# Patient Record
Sex: Male | Born: 1970
Health system: Southern US, Community
[De-identification: ages and names within clinical notes are randomized; demographics above are authoritative.]

## PROBLEM LIST (undated history)

## (undated) DIAGNOSIS — I10 Essential (primary) hypertension: Secondary | ICD-10-CM

## (undated) DIAGNOSIS — E079 Disorder of thyroid, unspecified: Secondary | ICD-10-CM

## (undated) DIAGNOSIS — E039 Hypothyroidism, unspecified: Secondary | ICD-10-CM

## (undated) DIAGNOSIS — M1990 Unspecified osteoarthritis, unspecified site: Secondary | ICD-10-CM

## (undated) DIAGNOSIS — E119 Type 2 diabetes mellitus without complications: Secondary | ICD-10-CM

## (undated) DIAGNOSIS — E785 Hyperlipidemia, unspecified: Secondary | ICD-10-CM

## (undated) DIAGNOSIS — N189 Chronic kidney disease, unspecified: Secondary | ICD-10-CM

## (undated) HISTORY — DX: Essential (primary) hypertension: I10

## (undated) HISTORY — DX: Disorder of thyroid, unspecified: E07.9

## (undated) HISTORY — DX: Chronic kidney disease, unspecified: N18.9

## (undated) HISTORY — PX: APPENDECTOMY: SHX54

## (undated) HISTORY — PX: COLONOSCOPY: SHX174

## (undated) HISTORY — DX: Unspecified osteoarthritis, unspecified site: M19.90

## (undated) HISTORY — DX: Type 2 diabetes mellitus without complications: E11.9

## (undated) HISTORY — DX: Hyperlipidemia, unspecified: E78.5

---

## 2005-10-21 ENCOUNTER — Ambulatory Visit (HOSPITAL_COMMUNITY): Admission: RE | Admit: 2005-10-21 | Discharge: 2005-10-21 | Payer: Self-pay | Admitting: General Practice

## 2005-11-26 ENCOUNTER — Ambulatory Visit: Payer: Self-pay

## 2012-09-01 ENCOUNTER — Telehealth: Payer: Self-pay | Admitting: Physician Assistant

## 2012-09-01 NOTE — Telephone Encounter (Signed)
This message is too vague for me to understand and act on

## 2012-09-01 NOTE — Telephone Encounter (Signed)
Needs verification on rx.

## 2012-09-02 NOTE — Telephone Encounter (Signed)
Call him to clarify what he called for

## 2012-09-22 NOTE — Telephone Encounter (Signed)
Spoke with pt this am and he said "its been taken care of" --he needed 90 day supply and has them

## 2012-12-13 ENCOUNTER — Ambulatory Visit (INDEPENDENT_AMBULATORY_CARE_PROVIDER_SITE_OTHER): Payer: BC Managed Care – PPO | Admitting: General Practice

## 2012-12-13 ENCOUNTER — Encounter: Payer: Self-pay | Admitting: General Practice

## 2012-12-13 VITALS — BP 129/79 | HR 82 | Temp 97.1°F | Ht 72.0 in | Wt 274.0 lb

## 2012-12-13 DIAGNOSIS — E039 Hypothyroidism, unspecified: Secondary | ICD-10-CM

## 2012-12-13 DIAGNOSIS — E119 Type 2 diabetes mellitus without complications: Secondary | ICD-10-CM

## 2012-12-13 DIAGNOSIS — I10 Essential (primary) hypertension: Secondary | ICD-10-CM

## 2012-12-13 DIAGNOSIS — Z09 Encounter for follow-up examination after completed treatment for conditions other than malignant neoplasm: Secondary | ICD-10-CM

## 2012-12-13 LAB — COMPLETE METABOLIC PANEL WITH GFR
BUN: 15 mg/dL (ref 6–23)
CO2: 27 mEq/L (ref 19–32)
Creat: 1.3 mg/dL (ref 0.50–1.35)
GFR, Est African American: 77 mL/min
GFR, Est Non African American: 67 mL/min
Glucose, Bld: 129 mg/dL — ABNORMAL HIGH (ref 70–99)
Sodium: 138 mEq/L (ref 135–145)
Total Bilirubin: 0.6 mg/dL (ref 0.3–1.2)
Total Protein: 7 g/dL (ref 6.0–8.3)

## 2012-12-13 LAB — POCT CBC
HCT, POC: 44.2 % (ref 43.5–53.7)
Lymph, poc: 3 (ref 0.6–3.4)
MCH, POC: 29.1 pg (ref 27–31.2)
MCHC: 35 g/dL (ref 31.8–35.4)
MCV: 83.2 fL (ref 80–97)
POC LYMPH PERCENT: 28.5 %L (ref 10–50)
RDW, POC: 13.4 %
WBC: 10.7 10*3/uL — AB (ref 4.6–10.2)

## 2012-12-13 LAB — THYROID PANEL WITH TSH: TSH: 4.672 u[IU]/mL — ABNORMAL HIGH (ref 0.350–4.500)

## 2012-12-13 MED ORDER — METFORMIN HCL 500 MG PO TABS: 500.0000 mg | ORAL_TABLET | Freq: Every day | ORAL | Status: DC

## 2012-12-13 MED ORDER — LEVOTHYROXINE SODIUM 100 MCG PO TABS: 100.0000 ug | ORAL_TABLET | Freq: Every day | ORAL | Status: DC

## 2012-12-13 MED ORDER — LISINOPRIL-HYDROCHLOROTHIAZIDE 10-12.5 MG PO TABS: 10.0000 | ORAL_TABLET | Freq: Every day | ORAL | Status: DC

## 2012-12-13 NOTE — Progress Notes (Signed)
  Subjective:    Patient ID: Tyler Duran, male    DOB: August 23, 1970, 42 y.o.   MRN: [id number]  HPI Present today for 3 month follow up of chronic conditions. He has hypertension, hypothyroidism, and diabetes type II. He denies checking blood pressure or blood sugars at home. He reports taking medications as prescribed. He reports that his current insurance won't pay for last prescribed diabetic medication. He denies any complaints at this time.     Review of Systems  Constitutional: Negative for fever and chills.  HENT: Negative for neck pain and neck stiffness.   Respiratory: Negative for chest tightness and shortness of breath.   Cardiovascular: Negative for chest pain and palpitations.  Gastrointestinal: Negative for vomiting, abdominal pain, diarrhea and blood in stool.  Genitourinary: Negative for hematuria and difficulty urinating.  Musculoskeletal: Negative for back pain.  Neurological: Negative for dizziness, weakness and headaches.       Objective:   Physical Exam  Constitutional: He is oriented to person, place, and time. He appears well-developed and well-nourished.  HENT:  Head: Normocephalic and atraumatic.  Right Ear: External ear normal.  Left Ear: External ear normal.  Eyes: EOM are normal.  Neck: Normal range of motion. Neck supple. No thyromegaly present.  Cardiovascular: Normal rate, regular rhythm and normal heart sounds.   Pulmonary/Chest: Effort normal and breath sounds normal. No respiratory distress. He exhibits no tenderness.  Abdominal: Soft. Bowel sounds are normal.  Neurological: He is alert and oriented to person, place, and time.  Skin: Skin is warm and dry.  Psychiatric: He has a normal mood and affect.          Assessment & Plan:  1. Diabetes - POCT glycosylated hemoglobin (Hb A1C) -Discussed change to diabetic medication - metFORMIN (GLUCOPHAGE) 500 MG tablet; Take 1 tablet (500 mg total) by mouth daily with breakfast.  Dispense: 30 tablet;  Refill: 3  2. Follow-up exam, 3-6 months since previous exam - POCT CBC - COMPLETE METABOLIC PANEL WITH GFR - NMR Lipoprofile with Lipids - Vitamin D 25 hydroxy  3. Unspecified hypothyroidism - Thyroid Panel With TSH - levothyroxine (SYNTHROID, LEVOTHROID) 100 MCG tablet; Take 1 tablet (100 mcg total) by mouth daily.  Dispense: 30 tablet; Refill: 3  4. Essential hypertension, benign - lisinopril-hydrochlorothiazide (PRINZIDE,ZESTORETIC) 10-12.5 MG per tablet; Take 10-12.5 tablets by mouth daily.  Dispense: 30 tablet; Refill: 3 -Continue all current medications Labs pending F/u in 3 months Discussed exercise and diet  Patient verbalized understanding Coralie Keens, FNP-C

## 2012-12-13 NOTE — Patient Instructions (Signed)

## 2012-12-14 ENCOUNTER — Other Ambulatory Visit: Payer: Self-pay | Admitting: General Practice

## 2012-12-14 DIAGNOSIS — E039 Hypothyroidism, unspecified: Secondary | ICD-10-CM

## 2012-12-14 LAB — VITAMIN D 25 HYDROXY (VIT D DEFICIENCY, FRACTURES): Vit D, 25-Hydroxy: 29 ng/mL — ABNORMAL LOW (ref 30–89)

## 2012-12-14 LAB — NMR LIPOPROFILE WITH LIPIDS
HDL Size: 8.5 nm — ABNORMAL LOW (ref >=9.2)
HDL-C: 33 mg/dL — ABNORMAL LOW (ref >=40)
LDL Size: 20.4 nm — ABNORMAL LOW (ref >20.5)
Large HDL-P: 1.7 umol/L — ABNORMAL LOW (ref >=4.8)

## 2012-12-14 MED ORDER — LEVOTHYROXINE SODIUM 112 MCG PO TABS: 112.0000 ug | ORAL_TABLET | Freq: Every day | ORAL | Status: DC

## 2012-12-19 ENCOUNTER — Telehealth: Payer: Self-pay | Admitting: General Practice

## 2012-12-19 NOTE — Telephone Encounter (Signed)
Patient aware of results.

## 2013-03-16 ENCOUNTER — Ambulatory Visit: Payer: BC Managed Care – PPO | Admitting: General Practice

## 2013-05-22 ENCOUNTER — Other Ambulatory Visit: Payer: Self-pay

## 2013-05-22 DIAGNOSIS — E039 Hypothyroidism, unspecified: Secondary | ICD-10-CM

## 2013-05-22 DIAGNOSIS — E119 Type 2 diabetes mellitus without complications: Secondary | ICD-10-CM

## 2013-05-22 MED ORDER — LEVOTHYROXINE SODIUM 112 MCG PO TABS: 112.0000 ug | ORAL_TABLET | Freq: Every day | ORAL | Status: DC

## 2013-05-22 NOTE — Telephone Encounter (Signed)
Last seen and last glucose in July 14  Mae

## 2013-05-24 ENCOUNTER — Ambulatory Visit (INDEPENDENT_AMBULATORY_CARE_PROVIDER_SITE_OTHER): Payer: BC Managed Care – PPO | Admitting: General Practice

## 2013-05-24 VITALS — BP 124/83 | HR 78 | Temp 97.4°F | Ht 72.0 in | Wt 294.0 lb

## 2013-05-24 DIAGNOSIS — M109 Gout, unspecified: Secondary | ICD-10-CM

## 2013-05-24 DIAGNOSIS — M10072 Idiopathic gout, left ankle and foot: Secondary | ICD-10-CM

## 2013-05-24 MED ORDER — INDOMETHACIN 50 MG PO CAPS: 50.0000 mg | ORAL_CAPSULE | Freq: Three times a day (TID) | ORAL | Status: DC

## 2013-05-24 MED ORDER — COLCHICINE 0.6 MG PO TABS: ORAL_TABLET | ORAL | Status: DC

## 2013-05-24 NOTE — Patient Instructions (Signed)

## 2013-05-24 NOTE — Progress Notes (Signed)
   Subjective:    Patient ID: Tyler Duran, male    DOB: 1970-11-06, 42 y.o.   MRN: [id number]  Toe Pain  The incident occurred 2 days ago. The incident occurred at home. There was no injury mechanism. The pain is present in the left toes. The quality of the pain is described as aching. The pain is at a severity of 4/10. The pain has been fluctuating since onset. Pertinent negatives include no inability to bear weight, loss of motion, numbness or tingling. He reports no foreign bodies present. The symptoms are aggravated by movement and palpation. He has tried NSAIDs for the symptoms. The treatment provided no relief.      Review of Systems  Constitutional: Negative for fever and chills.  Respiratory: Negative for chest tightness and shortness of breath.   Cardiovascular: Negative for chest pain and palpitations.  Genitourinary: Negative for difficulty urinating.  Musculoskeletal: Positive for joint swelling.       Left great toe joint pain and swelling  Neurological: Negative for dizziness, tingling, weakness, numbness and headaches.       Objective:   Physical Exam  Constitutional: He is oriented to person, place, and time. He appears well-developed and well-nourished.  Cardiovascular: Normal rate, regular rhythm and normal heart sounds.   Pulmonary/Chest: Effort normal and breath sounds normal. No respiratory distress. He exhibits no tenderness.  Musculoskeletal: He exhibits edema and tenderness.  Erythema, non pitting edema, and tenderness noted to left great toe, metatarsophalangeal joint.   Neurological: He is alert and oriented to person, place, and time.  Skin: Skin is warm and dry.  Psychiatric: He has a normal mood and affect.          Assessment & Plan:  1. Gouty arthritis of toe, left - CMP14+EGFR - Uric acid - indomethacin (INDOCIN) 50 MG capsule; Take 1 capsule (50 mg total) by mouth 3 (three) times daily with meals.  Dispense: 30 capsule; Refill: 0 -  colchicine 0.6 MG tablet; Take 1.2 mg (2 tablets) initially, then 0.6mg  (1 tablet) one hour later, wait 12 hours then take 0.6mg  (1 tablet) twice daily.  Dispense: 60 tablet; Refill: 1 -information sheet provided on gout -RTO if symptoms worsen or unresolved -Patient verbalized understanding Coralie Keens, FNP-C

## 2013-05-25 ENCOUNTER — Other Ambulatory Visit (INDEPENDENT_AMBULATORY_CARE_PROVIDER_SITE_OTHER): Payer: BC Managed Care – PPO

## 2013-05-25 DIAGNOSIS — E785 Hyperlipidemia, unspecified: Secondary | ICD-10-CM

## 2013-05-25 DIAGNOSIS — E119 Type 2 diabetes mellitus without complications: Secondary | ICD-10-CM

## 2013-05-25 DIAGNOSIS — E559 Vitamin D deficiency, unspecified: Secondary | ICD-10-CM

## 2013-05-25 DIAGNOSIS — E039 Hypothyroidism, unspecified: Secondary | ICD-10-CM

## 2013-05-25 LAB — CMP14+EGFR
ALT: 33 IU/L (ref 0–44)
Alkaline Phosphatase: 79 IU/L (ref 39–117)
BUN/Creatinine Ratio: 15 (ref 9–20)
Chloride: 98 mmol/L (ref 97–108)
GFR calc Af Amer: 98 mL/min/{1.73_m2} (ref >59)
Potassium: 4.4 mmol/L (ref 3.5–5.2)
Sodium: 139 mmol/L (ref 134–144)
Total Bilirubin: 0.5 mg/dL (ref 0.0–1.2)

## 2013-05-25 LAB — URIC ACID: Uric Acid: 8.2 mg/dL (ref 3.7–8.6)

## 2013-05-25 LAB — POCT GLYCOSYLATED HEMOGLOBIN (HGB A1C): Hemoglobin A1C: 6.9

## 2013-05-25 NOTE — Progress Notes (Signed)
Pt came in for labs only 

## 2013-05-26 LAB — NMR, LIPOPROFILE
HDL Cholesterol by NMR: 33 mg/dL — ABNORMAL LOW (ref >=40)
HDL Particle Number: 26 umol/L — ABNORMAL LOW (ref >=30.5)
Small LDL Particle Number: 994 nmol/L — ABNORMAL HIGH (ref <=527)

## 2013-05-26 LAB — THYROID PANEL WITH TSH
Free Thyroxine Index: 2.3 (ref 1.2–4.9)
TSH: 6.28 u[IU]/mL — ABNORMAL HIGH (ref 0.450–4.500)

## 2013-05-26 MED ORDER — METFORMIN HCL 500 MG PO TABS: 500.0000 mg | ORAL_TABLET | Freq: Every day | ORAL | Status: DC

## 2013-06-08 ENCOUNTER — Other Ambulatory Visit: Payer: Self-pay | Admitting: General Practice

## 2013-06-08 DIAGNOSIS — E559 Vitamin D deficiency, unspecified: Secondary | ICD-10-CM

## 2013-06-08 DIAGNOSIS — E039 Hypothyroidism, unspecified: Secondary | ICD-10-CM

## 2013-06-08 DIAGNOSIS — R7989 Other specified abnormal findings of blood chemistry: Secondary | ICD-10-CM

## 2013-06-08 MED ORDER — LEVOTHYROXINE SODIUM 125 MCG PO TABS: 125.0000 ug | ORAL_TABLET | Freq: Every day | ORAL | Status: DC

## 2013-06-08 MED ORDER — VITAMIN D3 1.25 MG (50000 UT) PO CAPS: 1.0000 | ORAL_CAPSULE | ORAL | Status: DC

## 2013-06-20 ENCOUNTER — Telehealth: Payer: Self-pay | Admitting: *Deleted

## 2013-06-27 ENCOUNTER — Encounter: Payer: Self-pay | Admitting: *Deleted

## 2013-06-27 NOTE — Progress Notes (Signed)
Quick Note:  Copy of labs sent to patient ______ 

## 2013-06-28 NOTE — Telephone Encounter (Signed)
Pt notified of labs and verbalizes understanding Will return to clinic to recheck labs as directed

## 2013-07-10 ENCOUNTER — Emergency Department (HOSPITAL_COMMUNITY): Payer: BC Managed Care – PPO

## 2013-07-10 ENCOUNTER — Emergency Department (HOSPITAL_COMMUNITY)
Admission: EM | Admit: 2013-07-10 | Discharge: 2013-07-10 | Disposition: A | Discharge status: Home / Self Care | Payer: BC Managed Care – PPO | Attending: Emergency Medicine | Admitting: Emergency Medicine

## 2013-07-10 ENCOUNTER — Encounter (HOSPITAL_COMMUNITY): Payer: Self-pay | Admitting: Emergency Medicine

## 2013-07-10 DIAGNOSIS — E119 Type 2 diabetes mellitus without complications: Secondary | ICD-10-CM | POA: Insufficient documentation

## 2013-07-10 DIAGNOSIS — Z791 Long term (current) use of non-steroidal anti-inflammatories (NSAID): Secondary | ICD-10-CM | POA: Insufficient documentation

## 2013-07-10 DIAGNOSIS — Z79899 Other long term (current) drug therapy: Secondary | ICD-10-CM | POA: Insufficient documentation

## 2013-07-10 DIAGNOSIS — R1013 Epigastric pain: Secondary | ICD-10-CM | POA: Insufficient documentation

## 2013-07-10 DIAGNOSIS — E079 Disorder of thyroid, unspecified: Secondary | ICD-10-CM | POA: Insufficient documentation

## 2013-07-10 DIAGNOSIS — I10 Essential (primary) hypertension: Secondary | ICD-10-CM | POA: Insufficient documentation

## 2013-07-10 DIAGNOSIS — Z87891 Personal history of nicotine dependence: Secondary | ICD-10-CM | POA: Insufficient documentation

## 2013-07-10 LAB — CBC WITH DIFFERENTIAL/PLATELET
BASOS PCT: 0 % (ref 0–1)
Basophils Absolute: 0 10*3/uL (ref 0.0–0.1)
Eosinophils Absolute: 0.2 10*3/uL (ref 0.0–0.7)
Eosinophils Relative: 1 % (ref 0–5)
HEMATOCRIT: 43.6 % (ref 39.0–52.0)
Hemoglobin: 14.4 g/dL (ref 13.0–17.0)
LYMPHS PCT: 26 % (ref 12–46)
Lymphs Abs: 2.9 10*3/uL (ref 0.7–4.0)
MCH: 28.3 pg (ref 26.0–34.0)
MCHC: 33 g/dL (ref 30.0–36.0)
MCV: 85.8 fL (ref 78.0–100.0)
MONO ABS: 0.9 10*3/uL (ref 0.1–1.0)
Monocytes Relative: 8 % (ref 3–12)
Neutro Abs: 7.3 10*3/uL (ref 1.7–7.7)
Neutrophils Relative %: 64 % (ref 43–77)
Platelets: 231 10*3/uL (ref 150–400)
RBC: 5.08 MIL/uL (ref 4.22–5.81)
RDW: 13.1 % (ref 11.5–15.5)
WBC: 11.3 10*3/uL — ABNORMAL HIGH (ref 4.0–10.5)

## 2013-07-10 LAB — COMPREHENSIVE METABOLIC PANEL
ALT: 45 U/L (ref 0–53)
AST: 21 U/L (ref 0–37)
Albumin: 3.8 g/dL (ref 3.5–5.2)
Alkaline Phosphatase: 90 U/L (ref 39–117)
BUN: 13 mg/dL (ref 6–23)
CALCIUM: 9.4 mg/dL (ref 8.4–10.5)
CO2: 27 mEq/L (ref 19–32)
CREATININE: 1.13 mg/dL (ref 0.50–1.35)
Chloride: 97 mEq/L (ref 96–112)
GFR calc Af Amer: 90 mL/min — ABNORMAL LOW (ref >90)
GFR calc non Af Amer: 77 mL/min — ABNORMAL LOW (ref >90)
GLUCOSE: 214 mg/dL — AB (ref 70–99)
Potassium: 4.2 mEq/L (ref 3.7–5.3)
SODIUM: 137 meq/L (ref 137–147)
TOTAL PROTEIN: 7.5 g/dL (ref 6.0–8.3)
Total Bilirubin: 0.4 mg/dL (ref 0.3–1.2)

## 2013-07-10 LAB — LIPASE, BLOOD: Lipase: 25 U/L (ref 11–59)

## 2013-07-10 MED ORDER — SUCRALFATE 1 G PO TABS
1.0000 g | ORAL_TABLET | Freq: Three times a day (TID) | ORAL | Status: DC
  Administered 2013-07-10: 1 g via ORAL
  Filled 2013-07-10 (×6): qty 1

## 2013-07-10 MED ORDER — SUCRALFATE 1 G PO TABS
ORAL_TABLET | ORAL | Status: AC
  Filled 2013-07-10: qty 1

## 2013-07-10 MED ORDER — OMEPRAZOLE 40 MG PO CPDR: 40.0000 mg | DELAYED_RELEASE_CAPSULE | Freq: Every day | ORAL | Status: DC

## 2013-07-10 MED ORDER — GI COCKTAIL ~~LOC~~
30.0000 mL | Freq: Once | ORAL | Status: AC
  Administered 2013-07-10: 30 mL via ORAL
  Filled 2013-07-10: qty 30

## 2013-07-10 MED ORDER — IOHEXOL 300 MG/ML  SOLN
50.0000 mL | Freq: Once | INTRAMUSCULAR | Status: AC | PRN
  Administered 2013-07-10: 50 mL via ORAL

## 2013-07-10 MED ORDER — SUCRALFATE 1 G PO TABS: 1.0000 g | ORAL_TABLET | Freq: Three times a day (TID) | ORAL | Status: DC

## 2013-07-10 MED ORDER — HYDROMORPHONE HCL PF 1 MG/ML IJ SOLN
1.0000 mg | Freq: Once | INTRAMUSCULAR | Status: AC
  Administered 2013-07-10: 1 mg via INTRAVENOUS
  Filled 2013-07-10: qty 1

## 2013-07-10 MED ORDER — FENTANYL CITRATE 0.05 MG/ML IJ SOLN: 100.0000 ug | Freq: Once | INTRAMUSCULAR | Status: DC

## 2013-07-10 MED ORDER — HYDROCODONE-ACETAMINOPHEN 5-325 MG PO TABS: 2.0000 | ORAL_TABLET | ORAL | Status: DC | PRN

## 2013-07-10 MED ORDER — FENTANYL CITRATE 0.05 MG/ML IJ SOLN
100.0000 ug | Freq: Once | INTRAMUSCULAR | Status: AC
  Administered 2013-07-10: 100 ug via INTRAVENOUS
  Filled 2013-07-10: qty 2

## 2013-07-10 MED ORDER — ONDANSETRON HCL 4 MG/2ML IJ SOLN
4.0000 mg | Freq: Once | INTRAMUSCULAR | Status: AC
  Administered 2013-07-10: 4 mg via INTRAVENOUS
  Filled 2013-07-10: qty 2

## 2013-07-10 MED ORDER — IOHEXOL 300 MG/ML  SOLN
100.0000 mL | Freq: Once | INTRAMUSCULAR | Status: AC | PRN
  Administered 2013-07-10: 100 mL via INTRAVENOUS

## 2013-07-10 MED ORDER — SODIUM CHLORIDE 0.9 % IV SOLN
INTRAVENOUS | Status: DC
  Administered 2013-07-10: 1000 mL via INTRAVENOUS

## 2013-07-10 NOTE — ED Notes (Signed)
Pt reporting epigastric pain starting about 9:30 tonight.  Pt denies pain radiates to any location.  Denies nausea or vomiting.  Reports pain has been progressively getting worse, particularly when laying down.

## 2013-07-10 NOTE — ED Notes (Signed)
EKG given to MD

## 2013-07-10 NOTE — ED Provider Notes (Signed)
CSN: [id number]     Arrival date & time 07/10/13  0110 History   First MD Initiated Contact with Patient 07/10/13 0142     Chief Complaint  Patient presents with  . Abdominal Pain   (Consider location/radiation/quality/duration/timing/severity/associated sxs/prior Treatment) HPI This is a 43 year old male with epigastric pain that began yesterday evening about 9:30. He describes it as feeling like there is a knot in his stomach. It is worse with palpation or movement. He took Pepto-Bismol without relief. He has had some retching but no frank vomiting. He denies diarrhea, constipation, fever, chest pain or shortness of breath. He rates the pain right now is about a 7.5/10. He denies a history of similar pain. The pain is somewhat worse when lying supine.  Past Medical History  Diagnosis Date  . Diabetes mellitus without complication   . Hypertension   . Thyroid disease    Past Surgical History  Procedure Laterality Date  . Appendectomy     Family History  Problem Relation Age of Onset  . Cancer Mother   . Diabetes Father    History  Substance Use Topics  . Smoking status: Former Smoker    Types: Cigarettes  . Smokeless tobacco: Not on file  . Alcohol Use: Yes     Comment: rare    Review of Systems  All other systems reviewed and are negative.    Allergies  Review of patient's allergies indicates no known allergies.  Home Medications   Current Outpatient Rx  Name  Route  Sig  Dispense  Refill  . levothyroxine (SYNTHROID, LEVOTHROID) 125 MCG tablet   Oral   Take 1 tablet (125 mcg total) by mouth daily before breakfast.   30 tablet   1   . lisinopril-hydrochlorothiazide (PRINZIDE,ZESTORETIC) 10-12.5 MG per tablet   Oral   Take 10-12.5 tablets by mouth daily.   30 tablet   3   . metFORMIN (GLUCOPHAGE) 500 MG tablet   Oral   Take 1 tablet (500 mg total) by mouth daily with breakfast.   30 tablet   0   . Cholecalciferol (VITAMIN D3) 50000 UNITS CAPS   Oral  Take 1 capsule by mouth once a week.   8 capsule   0   . colchicine 0.6 MG tablet      Take 1.2 mg (2 tablets) initially, then 0.6mg  (1 tablet) one hour later, wait 12 hours then take 0.6mg  (1 tablet) twice daily.   60 tablet   1   . indomethacin (INDOCIN) 50 MG capsule   Oral   Take 1 capsule (50 mg total) by mouth 3 (three) times daily with meals.   30 capsule   0    BP 110/59  Pulse 96  Temp(Src) 98.5 F (36.9 C) (Oral)  Resp 18  Ht 6' (1.829 m)  Wt 274 lb (124.286 kg)  BMI 37.15 kg/m2  SpO2 91%  Physical Exam General: Well-developed, well-nourished male in no acute distress; appearance consistent with age of record HENT: normocephalic; atraumatic Eyes: pupils equal, round and reactive to light; extraocular muscles intact Neck: supple Heart: regular rate and rhythm Lungs: clear to auscultation bilaterally Abdomen: soft; nondistended; epigastric tenderness; no masses or hepatosplenomegaly; bowel sounds present; no gallstones seen on bedside ultrasound, no sonographic Murphy's sign Extremities: No deformity; full range of motion; pulses normal Neurologic: Awake, alert and oriented; motor function intact in all extremities and symmetric; no facial droop Skin: Warm and dry Psychiatric: Normal mood and affect    ED Course  Procedures (including critical care time)   MDM   Nursing notes and vitals signs, including pulse oximetry, reviewed.  Summary of this visit's results, reviewed by myself:  Labs:  Results for orders placed during the hospital encounter of 07/10/13 (from the past 24 hour(s))  COMPREHENSIVE METABOLIC PANEL     Status: Abnormal   Collection Time    07/10/13  2:00 AM      Result Value Range   Sodium 137  137 - 147 mEq/L   Potassium 4.2  3.7 - 5.3 mEq/L   Chloride 97  96 - 112 mEq/L   CO2 27  19 - 32 mEq/L   Glucose, Bld 214 (*) 70 - 99 mg/dL   BUN 13  6 - 23 mg/dL   Creatinine, Ser 1.13  0.50 - 1.35 mg/dL   Calcium 9.4  8.4 - 10.5 mg/dL    Total Protein 7.5  6.0 - 8.3 g/dL   Albumin 3.8  3.5 - 5.2 g/dL   AST 21  0 - 37 U/L   ALT 45  0 - 53 U/L   Alkaline Phosphatase 90  39 - 117 U/L   Total Bilirubin 0.4  0.3 - 1.2 mg/dL   GFR calc non Af Amer 77 (*) >90 mL/min   GFR calc Af Amer 90 (*) >90 mL/min  CBC WITH DIFFERENTIAL     Status: Abnormal   Collection Time    07/10/13  2:00 AM      Result Value Range   WBC 11.3 (*) 4.0 - 10.5 K/uL   RBC 5.08  4.22 - 5.81 MIL/uL   Hemoglobin 14.4  13.0 - 17.0 g/dL   HCT 43.6  39.0 - 52.0 %   MCV 85.8  78.0 - 100.0 fL   MCH 28.3  26.0 - 34.0 pg   MCHC 33.0  30.0 - 36.0 g/dL   RDW 13.1  11.5 - 15.5 %   Platelets 231  150 - 400 K/uL   Neutrophils Relative % 64  43 - 77 %   Neutro Abs 7.3  1.7 - 7.7 K/uL   Lymphocytes Relative 26  12 - 46 %   Lymphs Abs 2.9  0.7 - 4.0 K/uL   Monocytes Relative 8  3 - 12 %   Monocytes Absolute 0.9  0.1 - 1.0 K/uL   Eosinophils Relative 1  0 - 5 %   Eosinophils Absolute 0.2  0.0 - 0.7 K/uL   Basophils Relative 0  0 - 1 %   Basophils Absolute 0.0  0.0 - 0.1 K/uL  LIPASE, BLOOD     Status: None   Collection Time    07/10/13  2:00 AM      Result Value Range   Lipase 25  11 - 59 U/L    Imaging Studies: Ct Abdomen Pelvis W Contrast  07/10/2013   CLINICAL DATA:  Epigastric pain and abdominal cramping.  EXAM: CT ABDOMEN AND PELVIS WITH CONTRAST  TECHNIQUE: Multidetector CT imaging of the abdomen and pelvis was performed using the standard protocol following bolus administration of intravenous contrast.  CONTRAST:  49mL OMNIPAQUE IOHEXOL 300 MG/ML SOLN, 175mL OMNIPAQUE IOHEXOL 300 MG/ML SOLN  COMPARISON:  None.  FINDINGS: BODY WALL: Unremarkable.  LOWER CHEST: Mild atelectasis at the left base.  ABDOMEN/PELVIS:  Liver: Diffuse fatty infiltration of the liver.  Biliary: No evidence of biliary obstruction or stone.  Pancreas: Unremarkable.  Spleen: Unremarkable.  Adrenals: Unremarkable.  Kidneys and ureters: Punctate, nonobstructive stone in the interpolar left  kidney. There are multiple bilateral low dense renal lesions, the majority too small to characterize. A measurable lesion in in the upper pole left kidney is simple/cystic, measuring 11 mm.  Bladder: Unremarkable.  Reproductive: Unremarkable.  Bowel: No obstruction. Surgically absent appendix.  Retroperitoneum: No mass or adenopathy.  Peritoneum: No free fluid or gas.  Vascular: Mild aortic and branch vessel atherosclerosis.  OSSEOUS: No acute abnormalities.  IMPRESSION: 1. Negative for acute intra-abdominal abnormality. 2. Fatty liver. 3. Nonobstructive left nephrolithiasis.   Electronically Signed   By: Jorje Guild M.D.   On: 07/10/2013 05:06   6:06 AM Patient's pain down to a 3/10 after IV analgesics. Patient reports no significant relief with Carafate or GI cocktail administered by mouth. Abdomen is still tender in the epigastrium and left upper quadrant.  6:13 AM We will place the patient on Carafate 4 times a day as well as proton pump inhibitor. He was advised to contact his PCP at [facility] today to arrange followup. He was advised to return should symptoms worsen over the next 12-24 hours.      Wynetta Fines, MD 07/10/13 [phone]

## 2013-07-10 NOTE — Discharge Instructions (Signed)
Abdominal Pain, Adult Many things can cause abdominal pain. Usually, abdominal pain is not caused by a disease and will improve without treatment. It can often be observed and treated at home. Your health care provider will do a physical exam and possibly order blood tests and X-rays to help determine the seriousness of your pain. However, in many cases, more time must pass before a clear cause of the pain can be found. Before that point, your health care provider may not know if you need more testing or further treatment. HOME CARE INSTRUCTIONS  Monitor your abdominal pain for any changes. The following actions may help to alleviate any discomfort you are experiencing:  Only take over-the-counter or prescription medicines as directed by your health care provider.  Do not take laxatives unless directed to do so by your health care provider.  Try a clear liquid diet (broth, tea, or water) as directed by your health care provider. Slowly move to a bland diet as tolerated. SEEK MEDICAL CARE IF:  You have unexplained abdominal pain.  You have abdominal pain associated with nausea or diarrhea.  You have pain when you urinate or have a bowel movement.  You experience abdominal pain that wakes you in the night.  You have abdominal pain that is worsened or improved by eating food.  You have abdominal pain that is worsened with eating fatty foods. SEEK IMMEDIATE MEDICAL CARE IF:   You have a fever.  You keep throwing up (vomiting).  Your pain is felt only in portions of the abdomen, such as the right side or the left lower portion of the abdomen.  You pass bloody or black tarry stools. MAKE SURE YOU:  Understand these instructions.   Will watch your condition.   Will get help right away if you are not doing well or get worse.  Document Released: 03/04/2005 Document Revised: 03/15/2013 Document Reviewed: 02/01/2013 [facility] Patient Information 2014 [employer].

## 2013-07-11 ENCOUNTER — Encounter: Payer: Self-pay | Admitting: Family Medicine

## 2013-07-11 ENCOUNTER — Ambulatory Visit (INDEPENDENT_AMBULATORY_CARE_PROVIDER_SITE_OTHER): Payer: BC Managed Care – PPO | Admitting: Family Medicine

## 2013-07-11 ENCOUNTER — Other Ambulatory Visit: Payer: Self-pay | Admitting: Family Medicine

## 2013-07-11 VITALS — BP 121/78 | HR 114 | Temp 97.2°F | Wt 272.8 lb

## 2013-07-11 DIAGNOSIS — D72829 Elevated white blood cell count, unspecified: Secondary | ICD-10-CM

## 2013-07-11 DIAGNOSIS — R42 Dizziness and giddiness: Secondary | ICD-10-CM

## 2013-07-11 DIAGNOSIS — R1011 Right upper quadrant pain: Secondary | ICD-10-CM

## 2013-07-11 DIAGNOSIS — R1013 Epigastric pain: Secondary | ICD-10-CM

## 2013-07-11 LAB — POCT CBC
Granulocyte percent: 73.2 %G (ref 37–80)
HCT, POC: 50 % (ref 43.5–53.7)
Hemoglobin: 16.3 g/dL (ref 14.1–18.1)
Lymph, poc: 4.5 — AB (ref 0.6–3.4)
MCH, POC: 27.5 pg (ref 27–31.2)
MCHC: 32.7 g/dL (ref 31.8–35.4)
MCV: 84.2 fL (ref 80–97)
MPV: 9.6 fL (ref 0–99.8)
POC Granulocyte: 13.2 — AB (ref 2–6.9)
POC LYMPH PERCENT: 24.7 %L (ref 10–50)
Platelet Count, POC: 241 10*3/uL (ref 142–424)
RBC: 5.9 M/uL (ref 4.69–6.13)
RDW, POC: 13.7 %
WBC: 18.1 10*3/uL — AB (ref 4.6–10.2)

## 2013-07-11 NOTE — Progress Notes (Signed)
   Subjective:    Patient ID: Tyler Duran, male    DOB: [birth year], 43 y.o.   MRN: [id number]  HPI  This 43 y.o. male presents for evaluation of abdominal pain.  He was recently hospitalized and discharged yesterday for abdominal pain.  He underwent CT and [place] which were negative for Abdominal pathology.  He was put on carafate 1 gram AC and HS and his pain is still there But not severe like it was.  Review of Systems C/o abdominal pain   No chest pain, SOB, HA, dizziness, vision change, N/V, diarrhea, constipation, dysuria, urinary urgency or frequency, myalgias, arthralgias or rash.  Objective:   Physical Exam Vital signs noted  Well developed well nourished male.  HEENT - Head atraumatic Normocephalic                Eyes - PERRLA, Conjuctiva - clear Sclera- Clear EOMI                Ears - EAC's Wnl TM's Wnl Gross Hearing WNL                Nose - Nares patent                 Throat - oropharanx wnl Respiratory - Lungs CTA bilateral Cardiac - RRR S1 and S2 without murmur GI - Abdomen soft, tender RUQ and positive Murphy's, and bowel sounds active x 4 Extremities - No edema. Neuro - Grossly intact.   EKG - NSR with no acute ST-T changes    Assessment & Plan:  .Epigastric pain - Plan: EKG 12-Lead, Ambulatory referral to Gastroenterology, POCT CBC, CMP14+EGFR  Dizziness - Plan: EKG 12-Lead, Ambulatory referral to Gastroenterology, POCT CBC, CMP14+EGFR  Continue carafate, if pain worsens then follow up prn or go to ED.  Lysbeth Penner FNP

## 2013-07-11 NOTE — Patient Instructions (Signed)
Abdominal Pain, Adult °Many things can cause abdominal pain. Usually, abdominal pain is not caused by a disease and will improve without treatment. It can often be observed and treated at home. Your health care provider will do a physical exam and possibly order blood tests and X-rays to help determine the seriousness of your pain. However, in many cases, more time must pass before a clear cause of the pain can be found. Before that point, your health care provider may not know if you need more testing or further treatment. °HOME CARE INSTRUCTIONS  °Monitor your abdominal pain for any changes. The following actions may help to alleviate any discomfort you are experiencing: °· Only take over-the-counter or prescription medicines as directed by your health care provider. °· Do not take laxatives unless directed to do so by your health care provider. °· Try a clear liquid diet (broth, tea, or water) as directed by your health care provider. Slowly move to a bland diet as tolerated. °SEEK MEDICAL CARE IF: °· You have unexplained abdominal pain. °· You have abdominal pain associated with nausea or diarrhea. °· You have pain when you urinate or have a bowel movement. °· You experience abdominal pain that wakes you in the night. °· You have abdominal pain that is worsened or improved by eating food. °· You have abdominal pain that is worsened with eating fatty foods. °SEEK IMMEDIATE MEDICAL CARE IF:  °· Your pain does not go away within 2 hours. °· You have a fever. °· You keep throwing up (vomiting). °· Your pain is felt only in portions of the abdomen, such as the right side or the left lower portion of the abdomen. °· You pass bloody or black tarry stools. °MAKE SURE YOU: °· Understand these instructions.   °· Will watch your condition.   °· Will get help right away if you are not doing well or get worse.   °Document Released: 03/04/2005 Document Revised: 03/15/2013 Document Reviewed: 02/01/2013 °[employer]® Patient  Information ©2014 [employer]. ° °

## 2013-07-12 ENCOUNTER — Emergency Department (HOSPITAL_COMMUNITY)
Admission: EM | Admit: 2013-07-12 | Discharge: 2013-07-12 | Disposition: A | Discharge status: Home / Self Care | Payer: BC Managed Care – PPO | Attending: Emergency Medicine | Admitting: Emergency Medicine

## 2013-07-12 ENCOUNTER — Encounter (HOSPITAL_COMMUNITY): Payer: Self-pay | Admitting: Emergency Medicine

## 2013-07-12 ENCOUNTER — Telehealth: Payer: Self-pay | Admitting: Family Medicine

## 2013-07-12 DIAGNOSIS — E119 Type 2 diabetes mellitus without complications: Secondary | ICD-10-CM | POA: Insufficient documentation

## 2013-07-12 DIAGNOSIS — Z87891 Personal history of nicotine dependence: Secondary | ICD-10-CM | POA: Insufficient documentation

## 2013-07-12 DIAGNOSIS — Z79899 Other long term (current) drug therapy: Secondary | ICD-10-CM | POA: Insufficient documentation

## 2013-07-12 DIAGNOSIS — I10 Essential (primary) hypertension: Secondary | ICD-10-CM | POA: Insufficient documentation

## 2013-07-12 DIAGNOSIS — E079 Disorder of thyroid, unspecified: Secondary | ICD-10-CM | POA: Insufficient documentation

## 2013-07-12 DIAGNOSIS — R1011 Right upper quadrant pain: Secondary | ICD-10-CM | POA: Insufficient documentation

## 2013-07-12 DIAGNOSIS — R109 Unspecified abdominal pain: Secondary | ICD-10-CM

## 2013-07-12 LAB — HEPATIC FUNCTION PANEL
ALBUMIN: 4.1 g/dL (ref 3.5–5.2)
ALK PHOS: 92 U/L (ref 39–117)
ALT: 33 U/L (ref 0–53)
AST: 24 U/L (ref 0–37)
Bilirubin, Direct: <0.2 mg/dL (ref 0.0–0.3)
Total Bilirubin: 0.6 mg/dL (ref 0.3–1.2)
Total Protein: 8.7 g/dL — ABNORMAL HIGH (ref 6.0–8.3)

## 2013-07-12 LAB — CMP14+EGFR
ALT: 35 IU/L (ref 0–44)
AST: 15 IU/L (ref 0–40)
Albumin/Globulin Ratio: 1.7 (ref 1.1–2.5)
Albumin: 4.7 g/dL (ref 3.5–5.5)
Alkaline Phosphatase: 84 IU/L (ref 39–117)
BUN/Creatinine Ratio: 9 (ref 9–20)
BUN: 10 mg/dL (ref 6–24)
CO2: 25 mmol/L (ref 18–29)
Calcium: 10.2 mg/dL (ref 8.7–10.2)
Chloride: 91 mmol/L — ABNORMAL LOW (ref 97–108)
Creatinine, Ser: 1.16 mg/dL (ref 0.76–1.27)
GFR calc Af Amer: 88 mL/min/{1.73_m2} (ref >59)
GFR calc non Af Amer: 76 mL/min/{1.73_m2} (ref >59)
Globulin, Total: 2.7 g/dL (ref 1.5–4.5)
Glucose: 198 mg/dL — ABNORMAL HIGH (ref 65–99)
Potassium: 4.5 mmol/L (ref 3.5–5.2)
Sodium: 136 mmol/L (ref 134–144)
Total Bilirubin: 0.7 mg/dL (ref 0.0–1.2)
Total Protein: 7.4 g/dL (ref 6.0–8.5)

## 2013-07-12 LAB — URINALYSIS, ROUTINE W REFLEX MICROSCOPIC
GLUCOSE, UA: NEGATIVE mg/dL
HGB URINE DIPSTICK: NEGATIVE
LEUKOCYTES UA: NEGATIVE
Nitrite: NEGATIVE
Protein, ur: NEGATIVE mg/dL
Specific Gravity, Urine: >1.03 — ABNORMAL HIGH (ref 1.005–1.030)
Urobilinogen, UA: 1 mg/dL (ref 0.0–1.0)
pH: 5.5 (ref 5.0–8.0)

## 2013-07-12 LAB — BASIC METABOLIC PANEL
BUN: 17 mg/dL (ref 6–23)
CALCIUM: 9.9 mg/dL (ref 8.4–10.5)
CHLORIDE: 98 meq/L (ref 96–112)
CO2: 26 meq/L (ref 19–32)
Creatinine, Ser: 1.25 mg/dL (ref 0.50–1.35)
GFR calc Af Amer: 79 mL/min — ABNORMAL LOW (ref >90)
GFR calc non Af Amer: 69 mL/min — ABNORMAL LOW (ref >90)
GLUCOSE: 180 mg/dL — AB (ref 70–99)
Potassium: 4.2 mEq/L (ref 3.7–5.3)
Sodium: 138 mEq/L (ref 137–147)

## 2013-07-12 LAB — CBC
HCT: 51 % (ref 39.0–52.0)
HEMOGLOBIN: 17.3 g/dL — AB (ref 13.0–17.0)
MCH: 29 pg (ref 26.0–34.0)
MCHC: 33.9 g/dL (ref 30.0–36.0)
MCV: 85.6 fL (ref 78.0–100.0)
PLATELETS: 257 10*3/uL (ref 150–400)
RBC: 5.96 MIL/uL — AB (ref 4.22–5.81)
RDW: 13.4 % (ref 11.5–15.5)
WBC: 16.5 10*3/uL — AB (ref 4.0–10.5)

## 2013-07-12 NOTE — Discharge Instructions (Signed)
Return tomorrow for ultrasound of abdomen.  If the ultrasound is normal then follow up this week with your family md

## 2013-07-12 NOTE — ED Provider Notes (Addendum)
CSN: [id number]     Arrival date & time 07/12/13  1400 History   This chart was scribed for Maudry Diego, MD by Era Bumpers, ED scribe. This patient was seen in room APA03/APA03 and the patient's care was started at 1800.    Chief Complaint  Patient presents with  . Abdominal Pain   Patient is a 43 y.o. male presenting with abdominal pain. The history is provided by the patient. No language interpreter was used.  Abdominal Pain Pain radiates to:  Does not radiate Pain severity:  No pain Duration:  4 hours Progression:  Improving Chronicity:  New Relieved by:  Nothing Worsened by:  Coughing Ineffective treatments:  None tried Associated symptoms: no chest pain, no cough, no diarrhea, no fatigue, no hematuria and no nausea    HPI Comments: Tyler Duran is a 43 y.o. male who presents to the Emergency Department for follow up of an ongoing problem relating to his abdomen which began 4 days ago and was instructed to go to ER by his PCP for elevated WBC along with his abdominal pain. He states was originally seen by his PCP 4 days ago while having cramping abdominal pain, had a negative US abdomen and was sent home. The next day he was bed resting and had improvement of his abdominal pain but did have some dizziness and generally not feeling well. 2 days ago he had more blood work performed at PCP and yesterday was informed it was elevated and today was instructed to go to ER. He states has outpatient CT orders as well.   He denies current abdominal pain unless he voluntarily coughs or laughs. He also reports reproducible abdominal pain by pressure to his abdomen. He denies nausea.   Past Medical History  Diagnosis Date  . Diabetes mellitus without complication   . Hypertension   . Thyroid disease    Past Surgical History  Procedure Laterality Date  . Appendectomy     Family History  Problem Relation Age of Onset  . Cancer Mother   . Diabetes Father    History  Substance Use  Topics  . Smoking status: Former Smoker    Types: Cigarettes  . Smokeless tobacco: Not on file  . Alcohol Use: Yes     Comment: rare    Review of Systems  Constitutional: Negative for appetite change and fatigue.  HENT: Negative for congestion, ear discharge and sinus pressure.   Eyes: Negative for discharge.  Respiratory: Negative for cough.   Cardiovascular: Negative for chest pain.  Gastrointestinal: Positive for abdominal pain. Negative for nausea and diarrhea.  Genitourinary: Negative for frequency and hematuria.  Musculoskeletal: Negative for back pain.  Skin: Negative for rash.  Neurological: Negative for seizures and headaches.  Psychiatric/Behavioral: Negative for hallucinations.  All other systems reviewed and are negative.    Allergies  Review of patient's allergies indicates no known allergies.  Home Medications   Current Outpatient Rx  Name  Route  Sig  Dispense  Refill  . Cholecalciferol (VITAMIN D3) 50000 UNITS CAPS   Oral   Take 1 capsule by mouth once a week.   8 capsule   0   . colchicine 0.6 MG tablet      Take 1.2 mg (2 tablets) initially, then 0.6mg  (1 tablet) one hour later, wait 12 hours then take 0.6mg  (1 tablet) twice daily.   60 tablet   1   . HYDROcodone-acetaminophen (NORCO) 5-325 MG per tablet   Oral   Take  2 tablets by mouth every 4 (four) hours as needed.   6 tablet   0   . indomethacin (INDOCIN) 50 MG capsule   Oral   Take 1 capsule (50 mg total) by mouth 3 (three) times daily with meals.   30 capsule   0   . levothyroxine (SYNTHROID, LEVOTHROID) 125 MCG tablet   Oral   Take 1 tablet (125 mcg total) by mouth daily before breakfast.   30 tablet   1   . lisinopril-hydrochlorothiazide (PRINZIDE,ZESTORETIC) 10-12.5 MG per tablet   Oral   Take 10-12.5 tablets by mouth daily.   30 tablet   3   . metFORMIN (GLUCOPHAGE) 500 MG tablet   Oral   Take 1 tablet (500 mg total) by mouth daily with breakfast.   30 tablet   0   .  omeprazole (PRILOSEC) 40 MG capsule   Oral   Take 1 capsule (40 mg total) by mouth daily.   30 capsule   0   . sucralfate (CARAFATE) 1 G tablet   Oral   Take 1 tablet (1 g total) by mouth 4 (four) times daily -  with meals and at bedtime.   40 tablet   0    Triage Vitals: BP 147/90  Pulse 124  Temp(Src) 98.7 F (37.1 C)  Resp 18  SpO2 95%  Physical Exam  Nursing note and vitals reviewed. Constitutional: He is oriented to person, place, and time. He appears well-developed and well-nourished. No distress.  HENT:  Head: Normocephalic and atraumatic.  Eyes: Conjunctivae are normal. Right eye exhibits no discharge. Left eye exhibits no discharge.  Neck: Normal range of motion.  Cardiovascular: Normal rate.   Pulmonary/Chest: Effort normal. No respiratory distress.  Abdominal: Soft. He exhibits no distension. There is tenderness (Minor RUQ tenderness).  Musculoskeletal: Normal range of motion. He exhibits no edema.  Neurological: He is alert and oriented to person, place, and time.  Skin: Skin is warm and dry.  Psychiatric: He has a normal mood and affect. Thought content normal.   ED Course  Procedures (including critical care time) DIAGNOSTIC STUDIES: Oxygen Saturation is 95% on room air, adequate by my interpretation.    COORDINATION OF CARE: At 1830 PM Discussed treatment plan with patient which includes blood work, UA, consult to surgery. Patient agrees.   Labs Review Labs Reviewed  CBC - Abnormal; Notable for the following:    WBC 16.5 (*)    RBC 5.96 (*)    Hemoglobin 17.3 (*)    All other components within normal limits  BASIC METABOLIC PANEL - Abnormal; Notable for the following:    Glucose, Bld 180 (*)    GFR calc non Af Amer 69 (*)    GFR calc Af Amer 79 (*)    All other components within normal limits  URINALYSIS, ROUTINE W REFLEX MICROSCOPIC   Imaging Review No results found.  EKG Interpretation   None      MDM  Abdominal pain.  I spoke with  general surgery and Dr. Arnoldo Morale stated if the hepatic function was normal the pt could have a formal US tomorrow am as out pt.  If [place] normal the pt can follow up with pcp this week.    The chart was scribed for me under my direct supervision.  I personally performed the history, physical, and medical decision making and all procedures in the evaluation of this patient.Maudry Diego, MD 07/12/13 2037  Maudry Diego, MD  07/12/13 2229 

## 2013-07-12 NOTE — ED Notes (Signed)
Here Sunday for abdominal pain and followed up with PMD yesterday. States WBC had doubled since Sunday and instructed pt to come to the ED.

## 2013-07-13 ENCOUNTER — Ambulatory Visit (HOSPITAL_COMMUNITY)
Admit: 2013-07-13 | Discharge: 2013-07-13 | Disposition: A | Discharge status: Home / Self Care | Payer: BC Managed Care – PPO | Attending: Emergency Medicine | Admitting: Emergency Medicine

## 2013-07-13 DIAGNOSIS — R160 Hepatomegaly, not elsewhere classified: Secondary | ICD-10-CM | POA: Insufficient documentation

## 2013-07-13 DIAGNOSIS — K8020 Calculus of gallbladder without cholecystitis without obstruction: Secondary | ICD-10-CM | POA: Insufficient documentation

## 2013-07-13 DIAGNOSIS — K7689 Other specified diseases of liver: Secondary | ICD-10-CM | POA: Insufficient documentation

## 2013-07-13 DIAGNOSIS — R1011 Right upper quadrant pain: Secondary | ICD-10-CM | POA: Insufficient documentation

## 2013-07-13 NOTE — Telephone Encounter (Signed)
This is the patient that you cancelled his surgical referral and we still have referral for GI

## 2013-07-13 NOTE — ED Provider Notes (Signed)
Pt returned today for [place].  Report is post for gall stones and positive murphy sign.  Pt with minimal pain,  No vomiting and minimal tenderness.  I spoke with dr. Arnoldo Morale.  He stated to put the pt on cipro and he will see the pt Tuesday at [place], MD 07/13/13 [phone]

## 2013-07-17 ENCOUNTER — Other Ambulatory Visit: Payer: Self-pay | Admitting: Family Medicine

## 2013-07-17 DIAGNOSIS — R109 Unspecified abdominal pain: Secondary | ICD-10-CM

## 2013-07-17 NOTE — Telephone Encounter (Signed)
He needs referral to surgery ASAP

## 2013-07-18 NOTE — H&P (Signed)
NTS SOAP Note  Vital Signs:  Vitals as of: 09/14/8117: Systolic 147: Diastolic 92: Heart Rate 89: Temp 97.35F: Height 19ft 0in: Weight 272Lbs 0 Ounces: Pain Level 1: BMI 36.89  BMI : 36.89 kg/m2  Subjective: This 43 Years 36 Months old Male presents for of abdominal pain.  Seen in ER recently for right upper quadrant abdominal pain, u/s of gallbladder shows cholelithiasis, normal common bile duct.  WBC elevated, started in Ciprofloxacin.  LFT's wnl.  No fever, chills, jaundice.  No nausea, vomiting, fatty food intolerance.  Review of Symptoms:    dizzy feeling Head:unremarkable    Eyes:unremarkable   Nose/Mouth/Throat:unremarkable Cardiovascular:  unremarkable   Respiratory:unremarkable   Gastrointestin    abdominal pain Genitourinary:unremarkable     Musculoskeletal:unremarkable   Skin:unremarkable Hematolgic/Lymphatic:unremarkable     Allergic/Immunologic:unremarkable     Past Medical History:    Reviewed  Past Medical History  Surgical History: appendectomy Medical Problems: HTN, hypothyroidism, niddm Allergies: nkda Medications: metformin, lisinopril, levothyroxine    Social History  Preferred Language: English Race:  White Ethnicity: Not Hispanic / Latino Age: 43 Years 5 Months Marital Status:  M Alcohol: rarely Recreational drug(s): no   Smoking Status: Unknown if ever smoked reviewed on 07/18/2013 Functional Status reviewed on 07/18/2013 ------------------------------------------------ Bathing: Normal Cooking: Normal Dressing: Normal Driving: Normal Eating: Normal Managing Meds: Normal Oral Care: Normal Shopping: Normal Toileting: Normal Transferring: Normal Walking: Normal Cognitive Status reviewed on 07/18/2013 ------------------------------------------------ Attention: Normal Decision Making: Normal Language: Normal Memory: Normal Motor: Normal Perception: Normal Problem Solving: Normal Visual and  Spatial: Normal   Family History:  Reviewed  Family Health History Mother, Living; Heart failure;  Father, Living; Diabetes mellitus, unspecified type;     Objective Information: General:  Well appearing, well nourished in no distress.   no scleral icterus Heart:  RRR, no murmur or gallop.  Normal S1, S2.  No S3, S4.  Lungs:    CTA bilaterally, no wheezes, rhonchi, rales.  Breathing unlabored. Abdomen:Soft, tender in right  upper quadrant to palpation/ND, normal bowel sounds, no HSM, no masses.  No peritoneal signs.  Assessment:Cholelithiasis, cholecystitis  Diagnoses: 574.00 Calculus of gallbladder with acute cholecystitis (Calculus of gallbladder with acute cholecystitis without obstruction)  Procedures: 82956 - OFFICE OUTPATIENT NEW 30 MINUTES    Plan:  Scheduled for laparoscopic cholecystectomy on 07/21/13.   Patient Education:Alternative treatments to surgery were discussed with patient (and family) 43  Risks and benefits  of procedure including bleeding, infection, hepatobiliary injury, and the possibility of an open procedure were fully explained to the patient (and family) who gave informed consent. Patient/family questions were addressed.  Follow-up:Pending Surgery

## 2013-07-19 ENCOUNTER — Encounter (HOSPITAL_COMMUNITY): Payer: Self-pay

## 2013-07-19 ENCOUNTER — Encounter (HOSPITAL_COMMUNITY)
Admission: RE | Admit: 2013-07-19 | Discharge: 2013-07-19 | Disposition: A | Discharge status: Home / Self Care | Payer: BC Managed Care – PPO | Source: Ambulatory Visit | Attending: General Surgery | Admitting: General Surgery

## 2013-07-19 HISTORY — DX: Hypothyroidism, unspecified: E03.9

## 2013-07-19 NOTE — Patient Instructions (Signed)
Tyler Duran  07/19/2013   Your procedure is scheduled on:  Friday, 07/21/13  Report to [facility] at 1000 AM.  Call this number if you have problems the morning of surgery: [phone]   Remember:   Do not eat food or drink liquids after midnight.   Take these medicines the morning of surgery with A SIP OF WATER: levothyroxine, zestoretic, omeprazole, and norco if needed.   Do not wear jewelry, make-up or nail polish.  Do not wear lotions, powders, or perfumes. You may wear deodorant.  Do not shave 48 hours prior to surgery. Men may shave face and neck.  Do not bring valuables to the hospital.  [facility] is not responsible                  for any belongings or valuables.               Contacts, dentures or bridgework may not be worn into surgery.  Leave suitcase in the car. After surgery it may be brought to your room.  For patients admitted to the hospital, discharge time is determined by your                treatment team.               Patients discharged the day of surgery will not be allowed to drive  home.  Name and phone number of your driver: family  Special Instructions: Shower using CHG 2 nights before surgery and the night before surgery.  If you shower the day of surgery use CHG.  Use special wash - you have one bottle of CHG for all showers.  You should use approximately 1/3 of the bottle for each shower.   Please read over the following fact sheets that you were given: Pain Booklet, Surgical Site Infection Prevention, Anesthesia Post-op Instructions and Care and Recovery After Surgery  Laparoscopic Cholecystectomy, Care After These instructions give you information on caring for yourself after your procedure. Your doctor may also give you more specific instructions. Call your doctor if you have any problems or questions after your procedure.  HOME CARE  Change your bandages (dressings) as told by your doctor.  Keep the wound dry and clean. Wash the wound gently  with soap and water. Pat the wound dry with a clean towel.  Do not take baths, swim, or use hot tubs for 2 weeks, or as told by your doctor.  Only take medicine as told by your doctor.  Eat a normal diet as told by your doctor.  Do not lift anything heavier than 10 pounds (4.5 kg) until your doctor says it is okay.  Do not play contact sports for 1 week, or as told by your doctor. GET HELP IF:  Your wound is red, puffy (swollen), or painful.  You have yellowish-white fluid (pus) coming from the wound.  You have fluid draining from the wound for more than 1 day.  You have a bad smell coming from the wound.  Your wound breaks open. GET HELP RIGHT AWAY IF:   You have a rash.  You have trouble breathing.  You have chest pain.  You have a fever.  You have pain in the shoulders (shoulder strap areas) that is getting worse.  You feel dizzy or pass out (faint).  You have severe belly (abdominal) pain.  You feel sick to your stomach (nauseous) or throw up (vomit) for more than 1 day. Document Released: 03/03/2008  Document Revised: 03/15/2013 Document Reviewed: 01/04/2013 [facility] Patient Information 2014 [place].  PATIENT INSTRUCTIONS POST-ANESTHESIA  IMMEDIATELY FOLLOWING SURGERY:  Do not drive or operate machinery for the first twenty four hours after surgery.  Do not make any important decisions for twenty four hours after surgery or while taking narcotic pain medications or sedatives.  If you develop intractable nausea and vomiting or a severe headache please notify your doctor immediately.  FOLLOW-UP:  Please make an appointment with your surgeon as instructed. You do not need to follow up with anesthesia unless specifically instructed to do so.  WOUND CARE INSTRUCTIONS (if applicable):  Keep a dry clean dressing on the anesthesia/puncture wound site if there is drainage.  Once the wound has quit draining you may leave it open to air.  Generally you should leave  the bandage intact for twenty four hours unless there is drainage.  If the epidural site drains for more than 36-48 hours please call the anesthesia department.  QUESTIONS?:  Please feel free to call your physician or the hospital operator if you have any questions, and they will be happy to assist you.

## 2013-07-20 ENCOUNTER — Encounter (HOSPITAL_COMMUNITY): Payer: Self-pay | Admitting: Pharmacy Technician

## 2013-07-21 ENCOUNTER — Encounter (HOSPITAL_COMMUNITY): Payer: BC Managed Care – PPO | Admitting: Anesthesiology

## 2013-07-21 ENCOUNTER — Encounter (HOSPITAL_COMMUNITY): Payer: Self-pay | Admitting: *Deleted

## 2013-07-21 ENCOUNTER — Ambulatory Visit (HOSPITAL_COMMUNITY)
Admission: RE | Admit: 2013-07-21 | Discharge: 2013-07-21 | Disposition: A | Discharge status: Home / Self Care | Payer: BC Managed Care – PPO | Source: Ambulatory Visit | Attending: General Surgery | Admitting: General Surgery

## 2013-07-21 ENCOUNTER — Encounter (HOSPITAL_COMMUNITY)
Admission: RE | Disposition: A | Discharge status: Home / Self Care | Payer: Self-pay | Source: Ambulatory Visit | Attending: General Surgery

## 2013-07-21 ENCOUNTER — Encounter: Payer: Self-pay | Admitting: Gastroenterology

## 2013-07-21 ENCOUNTER — Ambulatory Visit (HOSPITAL_COMMUNITY): Payer: BC Managed Care – PPO | Admitting: Anesthesiology

## 2013-07-21 DIAGNOSIS — K819 Cholecystitis, unspecified: Secondary | ICD-10-CM | POA: Insufficient documentation

## 2013-07-21 DIAGNOSIS — Z79899 Other long term (current) drug therapy: Secondary | ICD-10-CM | POA: Insufficient documentation

## 2013-07-21 DIAGNOSIS — E119 Type 2 diabetes mellitus without complications: Secondary | ICD-10-CM | POA: Insufficient documentation

## 2013-07-21 DIAGNOSIS — I10 Essential (primary) hypertension: Secondary | ICD-10-CM | POA: Insufficient documentation

## 2013-07-21 DIAGNOSIS — Z87891 Personal history of nicotine dependence: Secondary | ICD-10-CM | POA: Insufficient documentation

## 2013-07-21 HISTORY — PX: CHOLECYSTECTOMY: SHX55

## 2013-07-21 LAB — GLUCOSE, CAPILLARY
GLUCOSE-CAPILLARY: 159 mg/dL — AB (ref 70–99)
Glucose-Capillary: 146 mg/dL — ABNORMAL HIGH (ref 70–99)

## 2013-07-21 SURGERY — LAPAROSCOPIC CHOLECYSTECTOMY
Anesthesia: General | Site: Abdomen

## 2013-07-21 MED ORDER — POVIDONE-IODINE 10 % OINT PACKET
TOPICAL_OINTMENT | CUTANEOUS | Status: DC | PRN
  Administered 2013-07-21: 1 via TOPICAL

## 2013-07-21 MED ORDER — PROPOFOL 10 MG/ML IV BOLUS
INTRAVENOUS | Status: AC
  Filled 2013-07-21: qty 20

## 2013-07-21 MED ORDER — CLINDAMYCIN PHOSPHATE 900 MG/50ML IV SOLN
INTRAVENOUS | Status: DC | PRN
  Administered 2013-07-21: 900 mg via INTRAVENOUS

## 2013-07-21 MED ORDER — ONDANSETRON HCL 4 MG/2ML IJ SOLN
4.0000 mg | Freq: Once | INTRAMUSCULAR | Status: AC
  Administered 2013-07-21: 4 mg via INTRAVENOUS

## 2013-07-21 MED ORDER — GLYCOPYRROLATE 0.2 MG/ML IJ SOLN
0.2000 mg | Freq: Once | INTRAMUSCULAR | Status: AC
  Administered 2013-07-21: 0.2 mg via INTRAVENOUS

## 2013-07-21 MED ORDER — ROCURONIUM BROMIDE 50 MG/5ML IV SOLN
INTRAVENOUS | Status: AC
  Filled 2013-07-21: qty 1

## 2013-07-21 MED ORDER — MIDAZOLAM HCL 2 MG/2ML IJ SOLN
INTRAMUSCULAR | Status: AC
  Filled 2013-07-21: qty 2

## 2013-07-21 MED ORDER — ONDANSETRON HCL 4 MG/2ML IJ SOLN: 4.0000 mg | Freq: Once | INTRAMUSCULAR | Status: DC | PRN

## 2013-07-21 MED ORDER — LIDOCAINE HCL (PF) 1 % IJ SOLN
INTRAMUSCULAR | Status: AC
  Filled 2013-07-21: qty 5

## 2013-07-21 MED ORDER — MIDAZOLAM HCL 2 MG/2ML IJ SOLN
1.0000 mg | INTRAMUSCULAR | Status: DC | PRN
  Administered 2013-07-21: 2 mg via INTRAVENOUS

## 2013-07-21 MED ORDER — CLINDAMYCIN PHOSPHATE 900 MG/50ML IV SOLN
INTRAVENOUS | Status: AC
  Filled 2013-07-21: qty 50

## 2013-07-21 MED ORDER — KETOROLAC TROMETHAMINE 30 MG/ML IJ SOLN
30.0000 mg | Freq: Once | INTRAMUSCULAR | Status: AC
  Administered 2013-07-21: 30 mg via INTRAVENOUS
  Filled 2013-07-21: qty 1

## 2013-07-21 MED ORDER — SODIUM CHLORIDE 0.9 % IR SOLN
Status: DC | PRN
  Administered 2013-07-21: 3000 mL

## 2013-07-21 MED ORDER — HEMOSTATIC AGENTS (NO CHARGE) OPTIME
TOPICAL | Status: DC | PRN
  Administered 2013-07-21 (×2): 1 via TOPICAL

## 2013-07-21 MED ORDER — GLYCOPYRROLATE 0.2 MG/ML IJ SOLN
INTRAMUSCULAR | Status: DC | PRN
  Administered 2013-07-21: 0.2 mg via INTRAVENOUS

## 2013-07-21 MED ORDER — GLYCOPYRROLATE 0.2 MG/ML IJ SOLN
INTRAMUSCULAR | Status: AC
  Filled 2013-07-21: qty 1

## 2013-07-21 MED ORDER — GLYCOPYRROLATE 0.2 MG/ML IJ SOLN
INTRAMUSCULAR | Status: AC
  Filled 2013-07-21: qty 2

## 2013-07-21 MED ORDER — PROPOFOL 10 MG/ML IV BOLUS
INTRAVENOUS | Status: DC | PRN
  Administered 2013-07-21: 180 mg via INTRAVENOUS

## 2013-07-21 MED ORDER — MIDAZOLAM HCL 5 MG/5ML IJ SOLN
INTRAMUSCULAR | Status: DC | PRN
  Administered 2013-07-21 (×2): 2 mg via INTRAVENOUS

## 2013-07-21 MED ORDER — BUPIVACAINE HCL (PF) 0.5 % IJ SOLN
INTRAMUSCULAR | Status: DC | PRN
  Administered 2013-07-21: 10 mL

## 2013-07-21 MED ORDER — FENTANYL CITRATE 0.05 MG/ML IJ SOLN
INTRAMUSCULAR | Status: AC
  Filled 2013-07-21: qty 5

## 2013-07-21 MED ORDER — BUPIVACAINE HCL (PF) 0.5 % IJ SOLN
INTRAMUSCULAR | Status: AC
  Filled 2013-07-21: qty 30

## 2013-07-21 MED ORDER — FENTANYL CITRATE 0.05 MG/ML IJ SOLN: 25.0000 ug | INTRAMUSCULAR | Status: DC | PRN

## 2013-07-21 MED ORDER — FENTANYL CITRATE 0.05 MG/ML IJ SOLN
INTRAMUSCULAR | Status: DC | PRN
  Administered 2013-07-21: 100 ug via INTRAVENOUS
  Administered 2013-07-21: 50 ug via INTRAVENOUS
  Administered 2013-07-21 (×2): 100 ug via INTRAVENOUS
  Administered 2013-07-21: 50 ug via INTRAVENOUS
  Administered 2013-07-21: 100 ug via INTRAVENOUS

## 2013-07-21 MED ORDER — NEOSTIGMINE METHYLSULFATE 1 MG/ML IJ SOLN
INTRAMUSCULAR | Status: DC | PRN
Start: 2013-07-21 — End: 2013-07-21
  Administered 2013-07-21: 2 mg via INTRAVENOUS

## 2013-07-21 MED ORDER — OXYCODONE-ACETAMINOPHEN 7.5-325 MG PO TABS: 1.0000 | ORAL_TABLET | ORAL | Status: DC | PRN

## 2013-07-21 MED ORDER — SUCCINYLCHOLINE CHLORIDE 20 MG/ML IJ SOLN
INTRAMUSCULAR | Status: DC | PRN
  Administered 2013-07-21: 180 mg via INTRAVENOUS

## 2013-07-21 MED ORDER — POVIDONE-IODINE 10 % EX OINT
TOPICAL_OINTMENT | CUTANEOUS | Status: AC
  Filled 2013-07-21: qty 1

## 2013-07-21 MED ORDER — CHLORHEXIDINE GLUCONATE 4 % EX LIQD: 1.0000 "application " | Freq: Once | CUTANEOUS | Status: DC

## 2013-07-21 MED ORDER — LIDOCAINE HCL 1 % IJ SOLN
INTRAMUSCULAR | Status: DC | PRN
  Administered 2013-07-21: 50 mg via INTRADERMAL

## 2013-07-21 MED ORDER — ROCURONIUM BROMIDE 100 MG/10ML IV SOLN
INTRAVENOUS | Status: DC | PRN
  Administered 2013-07-21: 15 mg via INTRAVENOUS
  Administered 2013-07-21: 10 mg via INTRAVENOUS

## 2013-07-21 MED ORDER — LACTATED RINGERS IV SOLN
INTRAVENOUS | Status: DC
  Administered 2013-07-21 (×2): via INTRAVENOUS

## 2013-07-21 MED ORDER — SODIUM CHLORIDE 0.9 % IR SOLN
Status: DC | PRN
  Administered 2013-07-21: 1000 mL

## 2013-07-21 MED ORDER — ONDANSETRON HCL 4 MG/2ML IJ SOLN
INTRAMUSCULAR | Status: AC
  Filled 2013-07-21: qty 2

## 2013-07-21 SURGICAL SUPPLY — 50 items
APPLIER CLIP LAPSCP 10X32 DD (CLIP) ×1 IMPLANT
BAG HAMPER (MISCELLANEOUS) ×3 IMPLANT
BAG SPEC RTRVL LRG 6X4 10 (ENDOMECHANICALS) ×1
CLOTH BEACON ORANGE TIMEOUT ST (SAFETY) ×3 IMPLANT
COVER LIGHT HANDLE STERIS (MISCELLANEOUS) ×6 IMPLANT
DECANTER SPIKE VIAL GLASS SM (MISCELLANEOUS) ×3 IMPLANT
DURAPREP 26ML APPLICATOR (WOUND CARE) ×3 IMPLANT
ELECT REM PT RETURN 9FT ADLT (ELECTROSURGICAL) ×3
ELECTRODE REM PT RTRN 9FT ADLT (ELECTROSURGICAL) ×1 IMPLANT
FILTER SMOKE EVAC LAPAROSHD (FILTER) ×3 IMPLANT
FORMALIN 10 PREFIL 120ML (MISCELLANEOUS) ×3 IMPLANT
GLOVE BIO SURGEON STRL SZ7.5 (GLOVE) ×3 IMPLANT
GLOVE BIOGEL PI IND STRL 7.0 (GLOVE) IMPLANT
GLOVE BIOGEL PI IND STRL 7.5 (GLOVE) IMPLANT
GLOVE BIOGEL PI IND STRL 8 (GLOVE) ×1 IMPLANT
GLOVE BIOGEL PI IND STRL 8.5 (GLOVE) IMPLANT
GLOVE BIOGEL PI INDICATOR 7.0 (GLOVE) ×4
GLOVE BIOGEL PI INDICATOR 7.5 (GLOVE) ×2
GLOVE BIOGEL PI INDICATOR 8 (GLOVE) ×2
GLOVE BIOGEL PI INDICATOR 8.5 (GLOVE) ×2
GLOVE ECLIPSE 8.5 STRL (GLOVE) ×2 IMPLANT
GLOVE SS BIOGEL STRL SZ 6.5 (GLOVE) IMPLANT
GLOVE SUPERSENSE BIOGEL SZ 6.5 (GLOVE) ×4
GOWN STRL REUS W/TWL LRG LVL3 (GOWN DISPOSABLE) ×9 IMPLANT
GOWN STRL REUS W/TWL XL LVL3 (GOWN DISPOSABLE) ×2 IMPLANT
HEMOSTAT SNOW SURGICEL 2X4 (HEMOSTASIS) ×5 IMPLANT
INST SET LAPROSCOPIC AP (KITS) ×3 IMPLANT
IV NS IRRIG 3000ML ARTHROMATIC (IV SOLUTION) ×2 IMPLANT
KIT ROOM TURNOVER APOR (KITS) ×3 IMPLANT
MANIFOLD NEPTUNE II (INSTRUMENTS) ×3 IMPLANT
NDL INSUFFLATION 14GA 120MM (NEEDLE) ×1 IMPLANT
NEEDLE INSUFFLATION 14GA 120MM (NEEDLE) ×3 IMPLANT
NS IRRIG 1000ML POUR BTL (IV SOLUTION) ×3 IMPLANT
PACK LAP CHOLE LZT030E (CUSTOM PROCEDURE TRAY) ×3 IMPLANT
PAD ARMBOARD 7.5X6 YLW CONV (MISCELLANEOUS) ×3 IMPLANT
POUCH SPECIMEN RETRIEVAL 10MM (ENDOMECHANICALS) ×3 IMPLANT
SET BASIN LINEN APH (SET/KITS/TRAYS/PACK) ×3 IMPLANT
SET TUBE IRRIG SUCTION NO TIP (IRRIGATION / IRRIGATOR) ×2 IMPLANT
SLEEVE ENDOPATH XCEL 5M (ENDOMECHANICALS) ×3 IMPLANT
SPONGE GAUZE 2X2 8PLY STER LF (GAUZE/BANDAGES/DRESSINGS) ×5
SPONGE GAUZE 2X2 8PLY STRL LF (GAUZE/BANDAGES/DRESSINGS) ×9 IMPLANT
STAPLER VISISTAT (STAPLE) ×3 IMPLANT
SUT VICRYL 0 UR6 27IN ABS (SUTURE) ×5 IMPLANT
TAPE CLOTH SURG 4X10 WHT LF (GAUZE/BANDAGES/DRESSINGS) ×2 IMPLANT
TROCAR ENDO BLADELESS 11MM (ENDOMECHANICALS) ×3 IMPLANT
TROCAR XCEL NON-BLD 5MMX100MML (ENDOMECHANICALS) ×3 IMPLANT
TROCAR XCEL UNIV SLVE 11M 100M (ENDOMECHANICALS) ×3 IMPLANT
TUBING INSUFFLATION (TUBING) ×3 IMPLANT
WARMER LAPAROSCOPE (MISCELLANEOUS) ×3 IMPLANT
YANKAUER SUCT 12FT TUBE ARGYLE (SUCTIONS) ×3 IMPLANT

## 2013-07-21 NOTE — Discharge Instructions (Signed)
Laparoscopic Cholecystectomy, Care After °Refer to this sheet in the next few weeks. These instructions provide you with information on caring for yourself after your procedure. Your health care provider may also give you more specific instructions. Your treatment has been planned according to current medical practices, but problems sometimes occur. Call your health care provider if you have any problems or questions after your procedure. °WHAT TO EXPECT AFTER THE PROCEDURE °After your procedure, it is typical to have the following: °· Pain at your incision sites. You will be given pain medicines to control the pain. °· Mild nausea or vomiting. This should improve after the first 24 hours. °· Bloating and possibly shoulder pain from the gas used during the procedure. This will improve after the first 24 hours. °HOME CARE INSTRUCTIONS  °· Change bandages (dressings) as directed by your health care provider. °· Keep the wound dry and clean. You may wash the wound gently with soap and water. Gently blot or dab the area dry. °· Do not take baths or use swimming pools or hot tubs for 2 weeks or until your health care provider approves. °· Only take over-the-counter or prescription medicines as directed by your health care provider. °· Continue your normal diet as directed by your health care provider. °· Do not lift anything heavier than 10 pounds (4.5 kg) until your health care provider approves. °· Do not play contact sports for 1 week or until your health care provider approves. °SEEK MEDICAL CARE IF:  °· You have redness, swelling, or increasing pain in the wound. °· You notice yellowish-white fluid (pus) coming from the wound. °· You have drainage from the wound that lasts longer than 1 day. °· You notice a bad smell coming from the wound or dressing. °· Your surgical cuts (incisions) break open. °SEEK IMMEDIATE MEDICAL CARE IF:  °· You develop a rash. °· You have difficulty breathing. °· You have chest pain. °· You  have a fever. °· You have increasing pain in the shoulders (shoulder strap areas). °· You have dizzy episodes or faint while standing. °· You have severe abdominal pain. °· You feel sick to your stomach (nauseous) or throw up (vomit) and this lasts for more than 1 day. °Document Released: 05/25/2005 Document Revised: 03/15/2013 Document Reviewed: 01/04/2013 °[employer]® Patient Information ©2014 [employer]. ° °

## 2013-07-21 NOTE — Anesthesia Procedure Notes (Signed)
Procedure Name: Intubation Date/Time: 07/21/2013 10:43 AM Performed by: Charmaine Downs Pre-anesthesia Checklist: Patient being monitored, Suction available, Emergency Drugs available and Patient identified Patient Re-evaluated:Patient Re-evaluated prior to inductionOxygen Delivery Method: Circle system utilized Preoxygenation: Pre-oxygenation with 100% oxygen Intubation Type: IV induction Ventilation: Mask ventilation without difficulty Laryngoscope Size: Mac and 3 Grade View: Grade I Tube type: Oral Tube size: 8.0 mm Number of attempts: 1 Airway Equipment and Method: Stylet Placement Confirmation: ETT inserted through vocal cords under direct vision,  positive ETCO2 and breath sounds checked- equal and bilateral Secured at: 22 cm Tube secured with: Tape Dental Injury: Teeth and Oropharynx as per pre-operative assessment

## 2013-07-21 NOTE — Transfer of Care (Signed)
Immediate Anesthesia Transfer of Care Note  Patient: Tyler Duran  Procedure(s) Performed: Procedure(s): LAPAROSCOPIC CHOLECYSTECTOMY (N/A)  Patient Location: PACU  Anesthesia Type:General  Level of Consciousness: confused  Airway & Oxygen Therapy: Patient Spontanous Breathing and Patient connected to face mask oxygen  Post-op Assessment: Report given to PACU RN, Post -op Vital signs reviewed and stable and Patient moving all extremities  Post vital signs: Reviewed and stable  Complications: No apparent anesthesia complications

## 2013-07-21 NOTE — Op Note (Signed)
Patient:  Tyler Duran  DOB:  Mar 13, 1971  MRN:  [id number]   Preop Diagnosis:  Cholecystitis, cholelithiasis  Postop Diagnosis:  Same  Procedure:  Laparoscopic cholecystectomy  Surgeon:  Aviva Signs, M.D.  Anes:  General endotracheal  Indications:  Patient is a 43 year old white male who presents with right upper quadrant abdominal pain. Ultrasound the gallbladder reveals cholelithiasis. The risks and benefits of the procedure including bleeding, infection, hepatobiliary injury, the possibility of an open procedure were fully explained to the patient, who gave informed consent.  Procedure note:   The patient is placed the supine position. After induction of general endotracheal anesthesia, the abdomen was prepped and draped using usual sterile technique with DuraPrep. Surgical site confirmation was performed.  A supraumbilical incision was made down the fascia. A Veress needle was introduced into the abdominal cavity and confirmation of placement was done using the saline drop test. The abdomen was then insufflated to 16 mm mercury pressure. An 11 mm trocar was introduced into the abdominal cavity under direct visualization without difficulty. The patient was placed in reverse Trendelenburg position and additional 11 mm trocar was placed the epigastric region and 5 mm trochars were placed the right upper quadrant and right flank regions. Liver was inspected and noted to be within normal limits. The gallbladder was retracted and identified fashion in order to expose the triangle of Calot. The gallbladder wall was noted to be thickened. A large single stone was noted within the lumen of the gallbladder. The gallbladder was decompressed and decompressed in order to facilitate exposure. The cystic duct was first identified. Its juncture to the infundibulum was fully identified. Endoclips were placed proximally and distally on the cystic duct, and the cystic duct was divided. This was likewise done  the cystic artery. The gallbladder was then freed away from the gallbladder fossa using Bovie electrocautery. The gallbladder was delivered through the epigastric trocar site using an Endo Catch bag. The gallbladder fossa was inspected and no abnormal bleeding or bile leakage was noted. Surgicel is placed the gallbladder fossa. All fluid and air were then evacuated from the abdominal cavity prior to removal of the trochars.  All wounds were irrigated normal saline. All wounds were injected with 0.5% Sensorcaine. The supraumbilical fascia as well as epigastric fascia reapproximated using 0 Vicryl interrupted sutures. All skin incisions were closed using staples. Betadine ointment and dry sterile dressings were applied.  All tape and needle counts were correct at the end of the procedure. Patient was extubated in the operating room and transferred to PACU in stable condition.   Complications:  *none**  EBL:  *minimal**  Specimen:  *gallbladder**

## 2013-07-21 NOTE — Interval H&P Note (Signed)
History and Physical Interval Note:  07/21/2013 10:19 AM  Tyler Duran  has presented today for surgery, with the diagnosis of cholelithiasis  The various methods of treatment have been discussed with the patient and family. After consideration of risks, benefits and other options for treatment, the patient has consented to  Procedure(s): LAPAROSCOPIC CHOLECYSTECTOMY (N/A) as a surgical intervention .  The patient's history has been reviewed, patient examined, no change in status, stable for surgery.  I have reviewed the patient's chart and labs.  Questions were answered to the patient's satisfaction.     Aviva Signs A

## 2013-07-21 NOTE — Anesthesia Preprocedure Evaluation (Signed)
Anesthesia Evaluation  Patient identified by MRN, date of birth, ID band Patient awake    Reviewed: Allergy & Precautions, H&P , NPO status , Patient's Chart, lab work & pertinent test results  Airway Mallampati: II TM Distance: >3 FB Neck ROM: Full    Dental  (+) Teeth Intact   Pulmonary former smoker,  breath sounds clear to auscultation        Cardiovascular hypertension, Pt. on medications Rhythm:Regular Rate:Normal     Neuro/Psych    GI/Hepatic   Endo/Other  diabetes, Type 2, Oral Hypoglycemic AgentsHypothyroidism   Renal/GU      Musculoskeletal   Abdominal   Peds  Hematology   Anesthesia Other Findings   Reproductive/Obstetrics                           Anesthesia Physical Anesthesia Plan  ASA: II  Anesthesia Plan: General   Post-op Pain Management:    Induction: Intravenous  Airway Management Planned: Oral ETT  Additional Equipment:   Intra-op Plan:   Post-operative Plan: Extubation in OR  Informed Consent: I have reviewed the patients History and Physical, chart, labs and discussed the procedure including the risks, benefits and alternatives for the proposed anesthesia with the patient or authorized representative who has indicated his/her understanding and acceptance.     Plan Discussed with:   Anesthesia Plan Comments:         Anesthesia Quick Evaluation

## 2013-07-21 NOTE — Anesthesia Postprocedure Evaluation (Signed)
  Anesthesia Post-op Note  Patient: Tyler Duran  Procedure(s) Performed: Procedure(s): LAPAROSCOPIC CHOLECYSTECTOMY (N/A)  Patient Location: PACU  Anesthesia Type:General  Level of Consciousness: awake, alert , oriented and patient cooperative  Airway and Oxygen Therapy: Patient Spontanous Breathing  Post-op Pain: 2 /10, mild  Post-op Assessment: Post-op Vital signs reviewed, Patient's Cardiovascular Status Stable, Respiratory Function Stable, Patent Airway and Pain level controlled  Post-op Vital Signs: Reviewed and stable  Complications: No apparent anesthesia complications

## 2013-07-21 NOTE — Interval H&P Note (Signed)
History and Physical Interval Note:  07/21/2013 10:19 AM  Tyler Duran  has presented today for surgery, with the diagnosis of cholelithiasis  The various methods of treatment have been discussed with the patient and family. After consideration of risks, benefits and other options for treatment, the patient has consented to  Procedure(s): LAPAROSCOPIC CHOLECYSTECTOMY (N/A) as a surgical intervention .  The patient's history has been reviewed, patient examined, no change in status, stable for surgery.  I have reviewed the patient's chart and labs.  Questions were answered to the patient's satisfaction.     Latarshia Jersey A   

## 2013-07-25 ENCOUNTER — Encounter (HOSPITAL_COMMUNITY): Payer: Self-pay | Admitting: General Surgery

## 2013-08-08 ENCOUNTER — Other Ambulatory Visit: Payer: Self-pay

## 2013-08-08 DIAGNOSIS — E039 Hypothyroidism, unspecified: Secondary | ICD-10-CM

## 2013-08-08 MED ORDER — LEVOTHYROXINE SODIUM 125 MCG PO TABS: 125.0000 ug | ORAL_TABLET | Freq: Every day | ORAL | Status: DC

## 2013-08-21 ENCOUNTER — Other Ambulatory Visit: Payer: Self-pay | Admitting: *Deleted

## 2013-08-21 DIAGNOSIS — E119 Type 2 diabetes mellitus without complications: Secondary | ICD-10-CM

## 2013-08-21 MED ORDER — METFORMIN HCL 500 MG PO TABS: 500.0000 mg | ORAL_TABLET | Freq: Every day | ORAL | Status: DC

## 2013-08-21 NOTE — Telephone Encounter (Signed)
Last A1C 6.9 on 12/13/12. NTBS

## 2013-08-22 ENCOUNTER — Ambulatory Visit: Payer: BC Managed Care – PPO | Admitting: Gastroenterology

## 2013-08-24 ENCOUNTER — Other Ambulatory Visit: Payer: BC Managed Care – PPO

## 2013-08-30 ENCOUNTER — Other Ambulatory Visit (INDEPENDENT_AMBULATORY_CARE_PROVIDER_SITE_OTHER): Payer: BC Managed Care – PPO

## 2013-08-30 DIAGNOSIS — E559 Vitamin D deficiency, unspecified: Secondary | ICD-10-CM

## 2013-08-30 DIAGNOSIS — R7309 Other abnormal glucose: Secondary | ICD-10-CM

## 2013-08-30 LAB — POCT GLYCOSYLATED HEMOGLOBIN (HGB A1C): HEMOGLOBIN A1C: 7.5

## 2013-08-30 NOTE — Progress Notes (Signed)
Pt came in for labs only 

## 2013-08-31 LAB — BMP8+EGFR
BUN / CREAT RATIO: 11 (ref 9–20)
BUN: 13 mg/dL (ref 6–24)
CHLORIDE: 100 mmol/L (ref 97–108)
CO2: 25 mmol/L (ref 18–29)
Calcium: 9.4 mg/dL (ref 8.7–10.2)
Creatinine, Ser: 1.15 mg/dL (ref 0.76–1.27)
GFR calc Af Amer: 89 mL/min/{1.73_m2} (ref >59)
GFR calc non Af Amer: 77 mL/min/{1.73_m2} (ref >59)
Glucose: 165 mg/dL — ABNORMAL HIGH (ref 65–99)
Potassium: 4.6 mmol/L (ref 3.5–5.2)
SODIUM: 141 mmol/L (ref 134–144)

## 2013-08-31 LAB — THYROID PANEL WITH TSH
Free Thyroxine Index: 2.3 (ref 1.2–4.9)
T3 UPTAKE RATIO: 28 % (ref 24–39)
T4 TOTAL: 8.1 ug/dL (ref 4.5–12.0)
TSH: 4.48 u[IU]/mL (ref 0.450–4.500)

## 2013-08-31 LAB — LIPID PANEL
CHOL/HDL RATIO: 5.1 ratio — AB (ref 0.0–5.0)
Cholesterol, Total: 162 mg/dL (ref 100–199)
HDL: 32 mg/dL — AB (ref >39)
LDL Calculated: 80 mg/dL (ref 0–99)
Triglycerides: 252 mg/dL — ABNORMAL HIGH (ref 0–149)
VLDL Cholesterol Cal: 50 mg/dL — ABNORMAL HIGH (ref 5–40)

## 2013-08-31 LAB — VITAMIN D 25 HYDROXY (VIT D DEFICIENCY, FRACTURES): VIT D 25 HYDROXY: 32.3 ng/mL (ref 30.0–100.0)

## 2013-09-12 ENCOUNTER — Ambulatory Visit (INDEPENDENT_AMBULATORY_CARE_PROVIDER_SITE_OTHER): Payer: BC Managed Care – PPO | Admitting: General Practice

## 2013-09-12 ENCOUNTER — Encounter: Payer: Self-pay | Admitting: General Practice

## 2013-09-12 VITALS — BP 119/77 | HR 105 | Temp 98.2°F | Ht 72.0 in | Wt 274.2 lb

## 2013-09-12 DIAGNOSIS — E119 Type 2 diabetes mellitus without complications: Secondary | ICD-10-CM

## 2013-09-12 DIAGNOSIS — I10 Essential (primary) hypertension: Secondary | ICD-10-CM

## 2013-09-12 DIAGNOSIS — E039 Hypothyroidism, unspecified: Secondary | ICD-10-CM

## 2013-09-12 MED ORDER — LISINOPRIL-HYDROCHLOROTHIAZIDE 10-12.5 MG PO TABS: 1.0000 | ORAL_TABLET | Freq: Every day | ORAL | Status: DC

## 2013-09-12 MED ORDER — METFORMIN HCL 500 MG PO TABS: 500.0000 mg | ORAL_TABLET | Freq: Every day | ORAL | Status: DC

## 2013-09-12 NOTE — Patient Instructions (Signed)

## 2013-09-13 ENCOUNTER — Encounter: Payer: Self-pay | Admitting: General Practice

## 2013-09-13 NOTE — Progress Notes (Signed)
Patient ID: Tyler Duran, male   DOB: May 31, 1971, 43 y.o.   MRN: [id number]   Patient presents to discuss lab results. Patient reports being without medications for 1 week (was out of town and left medications at home) prior to coming in for last lab collection. Labs reviewed and discussed healthy eating and regular exercise. Continue taking medications as prescribed. Follow up in 3 months for labs to be rechecked. Patient verbalized understanding and in agreement.

## 2013-09-14 DIAGNOSIS — I10 Essential (primary) hypertension: Secondary | ICD-10-CM | POA: Insufficient documentation

## 2013-09-14 DIAGNOSIS — E039 Hypothyroidism, unspecified: Secondary | ICD-10-CM | POA: Insufficient documentation

## 2013-09-14 DIAGNOSIS — E119 Type 2 diabetes mellitus without complications: Secondary | ICD-10-CM | POA: Insufficient documentation

## 2013-09-18 ENCOUNTER — Encounter: Payer: Self-pay | Admitting: *Deleted

## 2013-10-09 ENCOUNTER — Encounter: Payer: Self-pay | Admitting: *Deleted

## 2013-11-06 ENCOUNTER — Ambulatory Visit (INDEPENDENT_AMBULATORY_CARE_PROVIDER_SITE_OTHER): Payer: BC Managed Care – PPO | Admitting: Family

## 2013-11-06 VITALS — BP 121/79 | HR 84 | Temp 98.3°F | Ht 72.0 in | Wt 274.6 lb

## 2013-11-06 DIAGNOSIS — W57XXXA Bitten or stung by nonvenomous insect and other nonvenomous arthropods, initial encounter: Secondary | ICD-10-CM

## 2013-11-06 DIAGNOSIS — T148 Other injury of unspecified body region: Secondary | ICD-10-CM

## 2013-11-06 MED ORDER — DOXYCYCLINE HYCLATE 100 MG PO TABS: 100.0000 mg | ORAL_TABLET | Freq: Two times a day (BID) | ORAL | Status: DC

## 2013-11-06 NOTE — Progress Notes (Signed)
   Subjective:    Patient ID: Tyler Duran, male    DOB: 10-28-1970, 43 y.o.   MRN: [id number]  HPI Pt presents with two red spots on left shoulder and back that he noticed last week. Pt states it feels his skin is tight and tender to touch and feels like it has gotten worse. States it is a throbbing 5 out 10 pain. Pt states he put some neosporin with little relief. Pt states he has been outside a lot lately, but can not remember coming into contact with anything new or bitten by anything. Pt denies any itching.   Review of Systems  HENT: Negative.   Cardiovascular: Negative.   All other systems reviewed and are negative.      Objective:   Physical Exam  Vitals reviewed. Constitutional: He is oriented to person, place, and time. He appears well-developed and well-nourished. No distress.  Eyes: Pupils are equal, round, and reactive to light.  Cardiovascular: Normal rate, regular rhythm, normal heart sounds and intact distal pulses.   No murmur heard. Pulmonary/Chest: Effort normal and breath sounds normal. No respiratory distress. He has no wheezes.  Abdominal: Soft. Bowel sounds are normal. He exhibits no distension. There is no tenderness.  Musculoskeletal: Normal range of motion. He exhibits no edema and no tenderness.  Neurological: He is alert and oriented to person, place, and time.  Skin: Skin is warm and dry. No rash noted. There is erythema.  Erythemas rash on left shoulder approx 6 in and an erythemas circular pustula on pts left upper back.    Psychiatric: He has a normal mood and affect. His behavior is normal. Judgment and thought content normal.    BP 121/79  Pulse 84  Temp(Src) 98.3 F (36.8 C) (Oral)  Ht 6' (1.829 m)  Wt 274 lb 9.6 oz (124.558 kg)  BMI 37.23 kg/m2       Assessment & Plan:  1. Bite from insect -Do not scratch area -Cool compresses if helps - doxycycline (VIBRA-TABS) 100 MG tablet; Take 1 tablet (100 mg total) by mouth 2 (two) times daily.   Dispense: 28 tablet; Refill: 0 -RTO prn  Evelina Dun, FNP

## 2013-11-06 NOTE — Patient Instructions (Signed)
Insect Bite  Mosquitoes, flies, fleas, bedbugs, and many other insects can bite. Insect bites are different from insect stings. A sting is when venom is injected into the skin. Some insect bites can transmit infectious diseases.  SYMPTOMS   Insect bites usually turn red, swell, and itch for 2 to 4 days. They often go away on their own.  TREATMENT   Your caregiver may prescribe antibiotic medicines if a bacterial infection develops in the bite.  HOME CARE INSTRUCTIONS   Do not scratch the bite area.   Keep the bite area clean and dry. Wash the bite area thoroughly with soap and water.   Put ice or cool compresses on the bite area.   Put ice in a plastic bag.   Place a towel between your skin and the bag.   Leave the ice on for 20 minutes, 4 times a day for the first 2 to 3 days, or as directed.   You may apply a baking soda paste, cortisone cream, or calamine lotion to the bite area as directed by your caregiver. This can help reduce itching and swelling.   Only take over-the-counter or prescription medicines as directed by your caregiver.   If you are given antibiotics, take them as directed. Finish them even if you start to feel better.  You may need a tetanus shot if:   You cannot remember when you had your last tetanus shot.   You have never had a tetanus shot.   The injury broke your skin.  If you get a tetanus shot, your arm may swell, get red, and feel warm to the touch. This is common and not a problem. If you need a tetanus shot and you choose not to have one, there is a rare chance of getting tetanus. Sickness from tetanus can be serious.  SEEK IMMEDIATE MEDICAL CARE IF:    You have increased pain, redness, or swelling in the bite area.   You see a red line on the skin coming from the bite.   You have a fever.   You have joint pain.   You have a headache or neck pain.   You have unusual weakness.   You have a rash.   You have chest pain or shortness of breath.    You have abdominal pain, nausea, or vomiting.   You feel unusually tired or sleepy.  MAKE SURE YOU:    Understand these instructions.   Will watch your condition.   Will get help right away if you are not doing well or get worse.  Document Released: 07/02/2004 Document Revised: 08/17/2011 Document Reviewed: 12/24/2010  [employer] Patient Information 2014 [employer]

## 2013-12-12 ENCOUNTER — Ambulatory Visit: Payer: BC Managed Care – PPO | Admitting: General Practice

## 2013-12-13 ENCOUNTER — Ambulatory Visit: Payer: BC Managed Care – PPO | Admitting: Nurse Practitioner

## 2013-12-14 ENCOUNTER — Ambulatory Visit (INDEPENDENT_AMBULATORY_CARE_PROVIDER_SITE_OTHER): Payer: BC Managed Care – PPO | Admitting: Nurse Practitioner

## 2013-12-14 ENCOUNTER — Encounter: Payer: Self-pay | Admitting: Nurse Practitioner

## 2013-12-14 VITALS — BP 148/80 | HR 77 | Temp 98.0°F | Ht 72.0 in | Wt 274.0 lb

## 2013-12-14 DIAGNOSIS — IMO0001 Reserved for inherently not codable concepts without codable children: Secondary | ICD-10-CM

## 2013-12-14 DIAGNOSIS — I10 Essential (primary) hypertension: Secondary | ICD-10-CM

## 2013-12-14 DIAGNOSIS — E785 Hyperlipidemia, unspecified: Secondary | ICD-10-CM

## 2013-12-14 DIAGNOSIS — E039 Hypothyroidism, unspecified: Secondary | ICD-10-CM

## 2013-12-14 DIAGNOSIS — E1165 Type 2 diabetes mellitus with hyperglycemia: Principal | ICD-10-CM

## 2013-12-14 LAB — POCT GLYCOSYLATED HEMOGLOBIN (HGB A1C): Hemoglobin A1C: 8.1

## 2013-12-14 LAB — POCT UA - MICROALBUMIN: Microalbumin Ur, POC: 20 mg/L

## 2013-12-14 MED ORDER — GLUCOSE BLOOD VI STRP: ORAL_STRIP | Status: AC

## 2013-12-14 MED ORDER — GLIMEPIRIDE 2 MG PO TABS: 2.0000 mg | ORAL_TABLET | Freq: Every day | ORAL | Status: DC

## 2013-12-14 NOTE — Progress Notes (Signed)
Subjective:    Patient ID: Tyler Duran, male    DOB: 08/17/1970, 43 y.o.   MRN: [id number]  Patient here today for follow up of chronic medical problems. No complaints today   Hypertension This is a chronic problem. The current episode started more than 1 year ago. The problem is controlled. Pertinent negatives include no chest pain, headaches, malaise/fatigue, neck pain, orthopnea, palpitations, peripheral edema, shortness of breath or sweats. There is no history of chronic renal disease.  Hyperlipidemia This is a chronic problem. The current episode started more than 1 year ago. The problem is controlled. Recent lipid tests were reviewed and are normal. Exacerbating diseases include diabetes and hypothyroidism. He has no history of chronic renal disease. Pertinent negatives include no chest pain or shortness of breath. Risk factors for coronary artery disease include hypertension, diabetes mellitus and obesity.  Diabetes He presents for his follow-up diabetic visit. He has type 2 diabetes mellitus. No MedicAlert identification noted. His disease course has been stable. Pertinent negatives for hypoglycemia include no headaches or sweats. Pertinent negatives for diabetes include no chest pain, no foot paresthesias, no polydipsia, no polyphagia and no polyuria. Symptoms are stable. Risk factors for coronary artery disease include hypertension and male sex. Current diabetic treatment includes diet and oral agent (monotherapy) (metformin causes diarrhea.). (Patient does not check blood sugars at home- has never been told to.)      Review of Systems  Constitutional: Negative.  Negative for malaise/fatigue.  Respiratory: Negative for shortness of breath.   Cardiovascular: Negative for chest pain, palpitations and orthopnea.  Endocrine: Negative for polydipsia, polyphagia and polyuria.  Musculoskeletal: Negative for neck pain.  Neurological: Negative for headaches.  All other systems reviewed  and are negative.      Objective:   Physical Exam  Constitutional: He is oriented to person, place, and time. He appears well-developed and well-nourished.  HENT:  Head: Normocephalic.  Right Ear: External ear normal.  Left Ear: External ear normal.  Nose: Nose normal.  Mouth/Throat: Oropharynx is clear and moist.  Eyes: EOM are normal. Pupils are equal, round, and reactive to light.  Neck: Normal range of motion. Neck supple. No JVD present. No thyromegaly present.  Cardiovascular: Normal rate, regular rhythm, normal heart sounds and intact distal pulses.  Exam reveals no gallop and no friction rub.   No murmur heard. Pulmonary/Chest: Effort normal and breath sounds normal. No respiratory distress. He has no wheezes. He has no rales. He exhibits no tenderness.  Abdominal: Soft. Bowel sounds are normal. He exhibits no mass. There is no tenderness.  Musculoskeletal: Normal range of motion. He exhibits no edema.  Lymphadenopathy:    He has no cervical adenopathy.  Neurological: He is alert and oriented to person, place, and time. No cranial nerve deficit.  Skin: Skin is warm and dry.  Psychiatric: He has a normal mood and affect. His behavior is normal. Judgment and thought content normal.   BP 148/80  Pulse 77  Temp(Src) 98 F (36.7 C) (Oral)  Ht 6' (1.829 m)  Wt 274 lb (124.286 kg)  BMI 37.15 kg/m2  Results for orders placed in visit on 12/14/13  POCT GLYCOSYLATED HEMOGLOBIN (HGB A1C)      Result Value Ref Range   Hemoglobin A1C 8.1           Assessment & Plan:   1. Type II or unspecified type diabetes mellitus without mention of complication, uncontrolled   2. Hypothyroidism, unspecified hypothyroidism type   3. Essential  hypertension   4. Other and unspecified hyperlipidemia    Orders Placed This Encounter  Procedures  . CMP14+EGFR  . NMR, lipoprofile  . POCT glycosylated hemoglobin (Hb A1C)  . POCT UA - Microalbumin   Meds ordered this encounter    Medications  . glimepiride (AMARYL) 2 MG tablet    Sig: Take 1 tablet (2 mg total) by mouth daily before breakfast.    Dispense:  30 tablet    Refill:  3    Order Specific Question:  Supervising Provider    Answer:  Chipper Herb [[id number]]  . glucose blood (ONETOUCH VERIO) test strip    Sig: Check blood sugar every morning fasting and prn  Dx 250.02    Dispense:  100 each    Refill:  12    Order Specific Question:  Supervising Provider    Answer:  Chipper Herb [[id number]]   Added amaryl instead of increasing metformin- metformin causing diarrhea at $RemoveBef'500mg'yoblNXweJL$  dose Keep diary of fasting blood sugars and bring to next appointment Labs pending Health maintenance reviewed Diet and exercise encouraged Continue all meds Follow up  In 3 months   [place], FNP

## 2013-12-14 NOTE — Patient Instructions (Signed)

## 2013-12-14 NOTE — Addendum Note (Signed)
Addended by: Earlene Plater on: 12/14/2013 12:57 PM   Modules accepted: Orders

## 2013-12-15 ENCOUNTER — Other Ambulatory Visit: Payer: Self-pay | Admitting: Nurse Practitioner

## 2013-12-15 LAB — CMP14+EGFR
A/G RATIO: 1.7 (ref 1.1–2.5)
ALBUMIN: 4.3 g/dL (ref 3.5–5.5)
ALT: 47 IU/L — ABNORMAL HIGH (ref 0–44)
AST: 21 IU/L (ref 0–40)
Alkaline Phosphatase: 92 IU/L (ref 39–117)
BUN/Creatinine Ratio: 16 (ref 9–20)
BUN: 17 mg/dL (ref 6–24)
CO2: 26 mmol/L (ref 18–29)
CREATININE: 1.09 mg/dL (ref 0.76–1.27)
Calcium: 9.8 mg/dL (ref 8.7–10.2)
Chloride: 100 mmol/L (ref 97–108)
GFR calc Af Amer: 95 mL/min/{1.73_m2} (ref >59)
GFR, EST NON AFRICAN AMERICAN: 82 mL/min/{1.73_m2} (ref >59)
GLUCOSE: 187 mg/dL — AB (ref 65–99)
Globulin, Total: 2.6 g/dL (ref 1.5–4.5)
Potassium: 4.4 mmol/L (ref 3.5–5.2)
Sodium: 142 mmol/L (ref 134–144)
TOTAL PROTEIN: 6.9 g/dL (ref 6.0–8.5)
Total Bilirubin: 0.4 mg/dL (ref 0.0–1.2)

## 2013-12-15 LAB — NMR, LIPOPROFILE
CHOLESTEROL: 190 mg/dL (ref 100–199)
HDL Cholesterol by NMR: 32 mg/dL — ABNORMAL LOW (ref >39)
HDL Particle Number: 28.1 umol/L — ABNORMAL LOW (ref >=30.5)
LDL PARTICLE NUMBER: 1385 nmol/L — AB (ref <1000)
LDL SIZE: 19.9 nm (ref >20.5)
LDLC SERPL CALC-MCNC: 87 mg/dL (ref 0–99)
LP-IR Score: 87 — ABNORMAL HIGH (ref <=45)
SMALL LDL PARTICLE NUMBER: 989 nmol/L — AB (ref <=527)
TRIGLYCERIDES BY NMR: 356 mg/dL — AB (ref 0–149)

## 2013-12-15 LAB — MICROALBUMIN, URINE: Microalbumin, Urine: 4.1 ug/mL (ref 0.0–17.0)

## 2013-12-15 MED ORDER — ATORVASTATIN CALCIUM 40 MG PO TABS: 40.0000 mg | ORAL_TABLET | Freq: Every day | ORAL | Status: DC

## 2014-02-08 ENCOUNTER — Encounter: Payer: Self-pay | Admitting: *Deleted

## 2014-03-21 ENCOUNTER — Ambulatory Visit: Payer: BC Managed Care – PPO | Admitting: Nurse Practitioner

## 2014-03-21 ENCOUNTER — Encounter: Payer: BC Managed Care – PPO | Admitting: Nurse Practitioner

## 2014-03-23 ENCOUNTER — Other Ambulatory Visit: Payer: Self-pay

## 2014-03-23 ENCOUNTER — Ambulatory Visit (INDEPENDENT_AMBULATORY_CARE_PROVIDER_SITE_OTHER): Payer: BC Managed Care – PPO | Admitting: Nurse Practitioner

## 2014-03-23 ENCOUNTER — Encounter: Payer: Self-pay | Admitting: Nurse Practitioner

## 2014-03-23 VITALS — BP 133/77 | HR 80 | Temp 98.7°F | Ht 72.0 in | Wt 277.0 lb

## 2014-03-23 DIAGNOSIS — E1169 Type 2 diabetes mellitus with other specified complication: Secondary | ICD-10-CM | POA: Insufficient documentation

## 2014-03-23 DIAGNOSIS — E038 Other specified hypothyroidism: Secondary | ICD-10-CM

## 2014-03-23 DIAGNOSIS — Z6837 Body mass index (BMI) 37.0-37.9, adult: Secondary | ICD-10-CM

## 2014-03-23 DIAGNOSIS — E785 Hyperlipidemia, unspecified: Secondary | ICD-10-CM | POA: Insufficient documentation

## 2014-03-23 DIAGNOSIS — I10 Essential (primary) hypertension: Secondary | ICD-10-CM

## 2014-03-23 DIAGNOSIS — E034 Atrophy of thyroid (acquired): Secondary | ICD-10-CM

## 2014-03-23 DIAGNOSIS — E119 Type 2 diabetes mellitus without complications: Secondary | ICD-10-CM

## 2014-03-23 LAB — POCT GLYCOSYLATED HEMOGLOBIN (HGB A1C)

## 2014-03-23 MED ORDER — LISINOPRIL-HYDROCHLOROTHIAZIDE 10-12.5 MG PO TABS: 1.0000 | ORAL_TABLET | Freq: Every day | ORAL | Status: DC

## 2014-03-23 MED ORDER — GLIMEPIRIDE 2 MG PO TABS: 2.0000 mg | ORAL_TABLET | Freq: Every day | ORAL | Status: DC

## 2014-03-23 MED ORDER — ATORVASTATIN CALCIUM 40 MG PO TABS: 40.0000 mg | ORAL_TABLET | Freq: Every day | ORAL | Status: DC

## 2014-03-23 MED ORDER — METFORMIN HCL 500 MG PO TABS: 500.0000 mg | ORAL_TABLET | Freq: Every day | ORAL | Status: DC

## 2014-03-23 MED ORDER — LEVOTHYROXINE SODIUM 125 MCG PO TABS: 125.0000 ug | ORAL_TABLET | Freq: Every day | ORAL | Status: DC

## 2014-03-23 NOTE — Patient Instructions (Signed)

## 2014-03-23 NOTE — Progress Notes (Signed)
Subjective:    Patient ID: Tyler Duran, male    DOB: [birth year], 43 y.o.   MRN: [id number]  Patient here today for follow up of chronic medical problems. No complaints today   Diabetes He presents for his follow-up diabetic visit. He has type 2 diabetes mellitus. No MedicAlert identification noted. His disease course has been stable. Pertinent negatives for hypoglycemia include no headaches or sweats. Pertinent negatives for diabetes include no chest pain, no foot paresthesias, no polydipsia, no polyphagia and no polyuria. Symptoms are stable. Risk factors for coronary artery disease include hypertension and male sex. Current diabetic treatment includes diet and oral agent (monotherapy) (metformin causes diarrhea.). (Patient does not check blood sugars at home- has never been told to.)  Hypertension This is a chronic problem. The current episode started more than 1 year ago. The problem is controlled. Pertinent negatives include no chest pain, headaches, malaise/fatigue, neck pain, orthopnea, palpitations, peripheral edema, shortness of breath or sweats. Hypertensive end-organ damage includes a thyroid problem. There is no history of chronic renal disease.  Hyperlipidemia This is a chronic problem. The current episode started more than 1 year ago. The problem is controlled. Recent lipid tests were reviewed and are normal. Exacerbating diseases include diabetes and hypothyroidism. He has no history of chronic renal disease. Pertinent negatives include no chest pain or shortness of breath. Risk factors for coronary artery disease include hypertension, diabetes mellitus and obesity.  Thyroid Problem Presents for follow-up (hypothyroidism) visit. Patient reports no palpitations. The symptoms have been stable. His past medical history is significant for diabetes and hyperlipidemia.      Review of Systems  Constitutional: Negative.  Negative for malaise/fatigue.  Respiratory: Negative for  shortness of breath.   Cardiovascular: Negative for chest pain, palpitations and orthopnea.  Endocrine: Negative for polydipsia, polyphagia and polyuria.  Musculoskeletal: Negative for neck pain.  Neurological: Negative for headaches.  All other systems reviewed and are negative.      Objective:   Physical Exam  Constitutional: He is oriented to person, place, and time. He appears well-developed and well-nourished.  HENT:  Head: Normocephalic.  Right Ear: External ear normal.  Left Ear: External ear normal.  Nose: Nose normal.  Mouth/Throat: Oropharynx is clear and moist.  Eyes: EOM are normal. Pupils are equal, round, and reactive to light.  Neck: Normal range of motion. Neck supple. No JVD present. No thyromegaly present.  Cardiovascular: Normal rate, regular rhythm, normal heart sounds and intact distal pulses.  Exam reveals no gallop and no friction rub.   No murmur heard. Pulmonary/Chest: Effort normal and breath sounds normal. No respiratory distress. He has no wheezes. He has no rales. He exhibits no tenderness.  Abdominal: Soft. Bowel sounds are normal. He exhibits no mass. There is no tenderness.  Musculoskeletal: Normal range of motion. He exhibits no edema.  Lymphadenopathy:    He has no cervical adenopathy.  Neurological: He is alert and oriented to person, place, and time. No cranial nerve deficit.  Skin: Skin is warm and dry.  Psychiatric: He has a normal mood and affect. His behavior is normal. Judgment and thought content normal.   BP 133/77  Pulse 80  Temp(Src) 98.7 F (37.1 C) (Oral)  Ht 6' (1.829 m)  Wt 277 lb (125.646 kg)  BMI 37.56 kg/m2   Results for orders placed in visit on 03/23/14  POCT GLYCOSYLATED HEMOGLOBIN (HGB A1C)      Result Value Ref Range   Hemoglobin A1C 7.0%  Assessment & Plan:  1. Type 2 diabetes mellitus without complication Watch carbs - POCT glycosylated hemoglobin (Hb A1C) - metFORMIN (GLUCOPHAGE) 500 MG tablet;  Take 1 tablet (500 mg total) by mouth daily with breakfast.  Dispense: 90 tablet; Refill: 1 - glimepiride (AMARYL) 2 MG tablet; Take 1 tablet (2 mg total) by mouth daily before breakfast.  Dispense: 90 tablet; Refill: 1  2. Essential hypertension Low NA+ diet - CMP14+EGFR - lisinopril-hydrochlorothiazide (PRINZIDE,ZESTORETIC) 10-12.5 MG per tablet; Take 1 tablet by mouth daily.  Dispense: 90 tablet; Refill: 1   3. Hypothyroidism due to acquired atrophy of thyroid - levothyroxine (SYNTHROID, LEVOTHROID) 125 MCG tablet; Take 1 tablet (125 mcg total) by mouth daily before breakfast.  Dispense: 90 tablet; Refill: 1  4. Hyperlipidemia with target LDL less than 100 **low fat diet _NMR - atorvastatin (LIPITOR) 40 MG tablet; Take 1 tablet (40 mg total) by mouth daily.  Dispense: 90 tablet; Refill: 1  5. BMI >25 Discussed diet and exercise for person with BMI >25 Will recheck weight in 3-6 months  Labs pending Health maintenance reviewed Diet and exercise encouraged Continue all meds Follow up  In 3 month   [place], FNP

## 2014-03-24 LAB — CMP14+EGFR
A/G RATIO: 1.5 (ref 1.1–2.5)
ALT: 38 IU/L (ref 0–44)
AST: 22 IU/L (ref 0–40)
Albumin: 4.2 g/dL (ref 3.5–5.5)
Alkaline Phosphatase: 103 IU/L (ref 39–117)
BUN/Creatinine Ratio: 14 (ref 9–20)
BUN: 16 mg/dL (ref 6–24)
CO2: 24 mmol/L (ref 18–29)
CREATININE: 1.11 mg/dL (ref 0.76–1.27)
Calcium: 9.6 mg/dL (ref 8.7–10.2)
Chloride: 98 mmol/L (ref 97–108)
GFR calc Af Amer: 92 mL/min/{1.73_m2} (ref >59)
GFR, EST NON AFRICAN AMERICAN: 80 mL/min/{1.73_m2} (ref >59)
GLOBULIN, TOTAL: 2.8 g/dL (ref 1.5–4.5)
Glucose: 140 mg/dL — ABNORMAL HIGH (ref 65–99)
Potassium: 4.5 mmol/L (ref 3.5–5.2)
SODIUM: 140 mmol/L (ref 134–144)
Total Bilirubin: 0.5 mg/dL (ref 0.0–1.2)
Total Protein: 7 g/dL (ref 6.0–8.5)

## 2014-03-24 LAB — NMR, LIPOPROFILE
Cholesterol: 120 mg/dL (ref 100–199)
HDL Cholesterol by NMR: 32 mg/dL — ABNORMAL LOW (ref >39)
HDL Particle Number: 28.2 umol/L — ABNORMAL LOW (ref >=30.5)
LDL Particle Number: 789 nmol/L (ref <1000)
LDL Size: 20.3 nm (ref >20.5)
LDLC SERPL CALC-MCNC: 57 mg/dL (ref 0–99)
LP-IR Score: 75 — ABNORMAL HIGH (ref <=45)
SMALL LDL PARTICLE NUMBER: 372 nmol/L (ref <=527)
TRIGLYCERIDES BY NMR: 155 mg/dL — AB (ref 0–149)

## 2014-03-26 ENCOUNTER — Other Ambulatory Visit: Payer: Self-pay | Admitting: Nurse Practitioner

## 2014-05-27 ENCOUNTER — Emergency Department (HOSPITAL_COMMUNITY)
Admission: EM | Admit: 2014-05-27 | Discharge: 2014-05-27 | Disposition: A | Discharge status: Home / Self Care | Payer: BC Managed Care – PPO | Attending: Emergency Medicine | Admitting: Emergency Medicine

## 2014-05-27 ENCOUNTER — Encounter (HOSPITAL_COMMUNITY): Payer: Self-pay | Admitting: *Deleted

## 2014-05-27 ENCOUNTER — Emergency Department (HOSPITAL_COMMUNITY): Payer: BC Managed Care – PPO

## 2014-05-27 DIAGNOSIS — Z79899 Other long term (current) drug therapy: Secondary | ICD-10-CM | POA: Insufficient documentation

## 2014-05-27 DIAGNOSIS — Z87891 Personal history of nicotine dependence: Secondary | ICD-10-CM | POA: Insufficient documentation

## 2014-05-27 DIAGNOSIS — R1032 Left lower quadrant pain: Secondary | ICD-10-CM | POA: Diagnosis present

## 2014-05-27 DIAGNOSIS — E119 Type 2 diabetes mellitus without complications: Secondary | ICD-10-CM | POA: Diagnosis not present

## 2014-05-27 DIAGNOSIS — I10 Essential (primary) hypertension: Secondary | ICD-10-CM | POA: Insufficient documentation

## 2014-05-27 DIAGNOSIS — N200 Calculus of kidney: Secondary | ICD-10-CM | POA: Diagnosis not present

## 2014-05-27 DIAGNOSIS — E039 Hypothyroidism, unspecified: Secondary | ICD-10-CM | POA: Insufficient documentation

## 2014-05-27 LAB — CBC WITH DIFFERENTIAL/PLATELET
BASOS ABS: 0.1 10*3/uL (ref 0.0–0.1)
Basophils Relative: 1 % (ref 0–1)
EOS PCT: 2 % (ref 0–5)
Eosinophils Absolute: 0.2 10*3/uL (ref 0.0–0.7)
HEMATOCRIT: 45.9 % (ref 39.0–52.0)
HEMOGLOBIN: 14.7 g/dL (ref 13.0–17.0)
Lymphocytes Relative: 19 % (ref 12–46)
Lymphs Abs: 2.4 10*3/uL (ref 0.7–4.0)
MCH: 28.1 pg (ref 26.0–34.0)
MCHC: 32 g/dL (ref 30.0–36.0)
MCV: 87.6 fL (ref 78.0–100.0)
MONO ABS: 1 10*3/uL (ref 0.1–1.0)
MONOS PCT: 8 % (ref 3–12)
Neutro Abs: 8.9 10*3/uL — ABNORMAL HIGH (ref 1.7–7.7)
Neutrophils Relative %: 70 % (ref 43–77)
Platelets: 237 10*3/uL (ref 150–400)
RBC: 5.24 MIL/uL (ref 4.22–5.81)
RDW: 13.7 % (ref 11.5–15.5)
WBC: 12.5 10*3/uL — ABNORMAL HIGH (ref 4.0–10.5)

## 2014-05-27 LAB — URINALYSIS, ROUTINE W REFLEX MICROSCOPIC
BILIRUBIN URINE: NEGATIVE
GLUCOSE, UA: NEGATIVE mg/dL
Ketones, ur: NEGATIVE mg/dL
Leukocytes, UA: NEGATIVE
Nitrite: NEGATIVE
PROTEIN: NEGATIVE mg/dL
Specific Gravity, Urine: 1.025 (ref 1.005–1.030)
Urobilinogen, UA: 0.2 mg/dL (ref 0.0–1.0)
pH: 5.5 (ref 5.0–8.0)

## 2014-05-27 LAB — HEPATIC FUNCTION PANEL
ALT: 32 U/L (ref 0–53)
AST: 21 U/L (ref 0–37)
Albumin: 4.1 g/dL (ref 3.5–5.2)
Alkaline Phosphatase: 101 U/L (ref 39–117)
BILIRUBIN TOTAL: 0.5 mg/dL (ref 0.3–1.2)
Bilirubin, Direct: <0.2 mg/dL (ref 0.0–0.3)
Total Protein: 7.8 g/dL (ref 6.0–8.3)

## 2014-05-27 LAB — BASIC METABOLIC PANEL
ANION GAP: 14 (ref 5–15)
BUN: 14 mg/dL (ref 6–23)
CALCIUM: 9.2 mg/dL (ref 8.4–10.5)
CHLORIDE: 100 meq/L (ref 96–112)
CO2: 25 meq/L (ref 19–32)
CREATININE: 1.05 mg/dL (ref 0.50–1.35)
GFR calc Af Amer: >90 mL/min (ref >90)
GFR calc non Af Amer: 84 mL/min — ABNORMAL LOW (ref >90)
Glucose, Bld: 161 mg/dL — ABNORMAL HIGH (ref 70–99)
Potassium: 4 mEq/L (ref 3.7–5.3)
Sodium: 139 mEq/L (ref 137–147)

## 2014-05-27 LAB — URINE MICROSCOPIC-ADD ON

## 2014-05-27 MED ORDER — TAMSULOSIN HCL 0.4 MG PO CAPS: 0.4000 mg | ORAL_CAPSULE | Freq: Every day | ORAL | Status: DC

## 2014-05-27 MED ORDER — IOHEXOL 300 MG/ML  SOLN
100.0000 mL | Freq: Once | INTRAMUSCULAR | Status: AC | PRN
  Administered 2014-05-27: 100 mL via INTRAVENOUS

## 2014-05-27 MED ORDER — SODIUM CHLORIDE 0.9 % IJ SOLN
INTRAMUSCULAR | Status: AC
  Filled 2014-05-27: qty 24

## 2014-05-27 MED ORDER — KETOROLAC TROMETHAMINE 30 MG/ML IJ SOLN
30.0000 mg | Freq: Once | INTRAMUSCULAR | Status: AC
  Administered 2014-05-27: 30 mg via INTRAVENOUS
  Filled 2014-05-27: qty 1

## 2014-05-27 MED ORDER — IOHEXOL 300 MG/ML  SOLN
50.0000 mL | Freq: Once | INTRAMUSCULAR | Status: AC | PRN
  Administered 2014-05-27: 50 mL via ORAL

## 2014-05-27 MED ORDER — IBUPROFEN 800 MG PO TABS: 800.0000 mg | ORAL_TABLET | Freq: Three times a day (TID) | ORAL | Status: DC | PRN

## 2014-05-27 MED ORDER — SODIUM CHLORIDE 0.9 % IJ SOLN
INTRAMUSCULAR | Status: AC
  Filled 2014-05-27: qty 400

## 2014-05-27 MED ORDER — ONDANSETRON 4 MG PO TBDP: 4.0000 mg | ORAL_TABLET | Freq: Three times a day (TID) | ORAL | Status: DC | PRN

## 2014-05-27 MED ORDER — HYDROCODONE-ACETAMINOPHEN 5-325 MG PO TABS: 1.0000 | ORAL_TABLET | ORAL | Status: DC | PRN

## 2014-05-27 MED ORDER — GI COCKTAIL ~~LOC~~: 30.0000 mL | Freq: Once | ORAL | Status: DC

## 2014-05-27 NOTE — ED Provider Notes (Signed)
This chart was scribed for [place], DO by Peyton Bottoms, ED Scribe. This patient was seen in room [facility]14[facility]14 and the patient's care was started at 7:51 PM.   TIME SEEN: 8:00 PM   CHIEF COMPLAINT: Abdominal Pain  HPI: Patient is a 43 y.o. male with a history of appendectomy, cholecystectomy, diabetes, hypertension, thyroid disease, hypothyroidism, who presents to the ED complaining of moderate LLQ abdominal pain that began last night. He states that the pain worsened and woke him up from sleep at 5AM this morning. He describes the pain as similar to being constipated. He describes it as a "twisting" sensation. He states that he is not constipated and has had normal bowel movements. He states that he could not sit down long enough to eat a meal 2/2 pain. He states that the pain decreases when laying down supine. He states he felt nauseous when eating dinner earlier today. He denies associated F/C, V/D, hematochezia, melena, dysuria, hematuria, testicular pain or swelling, penile dc. He denies history of colonoscopy, diverticulitis. No h/o kidney stones.  He denies any sick contacts.   ROS: See HPI Constitutional: no fever  Eyes: no drainage  ENT: no runny nose   Cardiovascular:  no chest pain  Resp: no SOB  GI: no vomiting; Nausea; LLQ Abdominal Pain.  GU: no dysuria Integumentary: no rash  Allergy: no hives  Musculoskeletal: no leg swelling  Neurological: no slurred speech ROS otherwise negative  PAST MEDICAL HISTORY/PAST SURGICAL HISTORY:  Past Medical History  Diagnosis Date  . Diabetes mellitus without complication   . Hypertension   . Thyroid disease   . Hypothyroidism     MEDICATIONS:  Prior to Admission medications   Medication Sig Start Date End Date Taking? Authorizing Provider  atorvastatin (LIPITOR) 40 MG tablet Take 1 tablet (40 mg total) by mouth daily. 03/23/14   Mary-Margaret Hassell Done, FNP  glimepiride (AMARYL) 2 MG tablet Take 1 tablet (2 mg total) by mouth  daily before breakfast. 03/23/14   Mary-Margaret Hassell Done, FNP  glucose blood (ONETOUCH VERIO) test strip Check blood sugar every morning fasting and prn  Dx 250.02 12/14/13   Mary-Margaret Hassell Done, FNP  levothyroxine (SYNTHROID, LEVOTHROID) 125 MCG tablet Take 1 tablet (125 mcg total) by mouth daily before breakfast. 03/23/14   Mary-Margaret Hassell Done, FNP  lisinopril-hydrochlorothiazide (PRINZIDE,ZESTORETIC) 10-12.5 MG per tablet Take 1 tablet by mouth daily. 03/23/14   Mary-Margaret Hassell Done, FNP  metFORMIN (GLUCOPHAGE) 500 MG tablet TAKE 1 TABLET (500 MG TOTAL) BY MOUTH DAILY WITH BREAKFAST. 03/27/14   Chipper Herb, MD    ALLERGIES:  No Known Allergies  SOCIAL HISTORY:  History  Substance Use Topics  . Smoking status: Former Smoker    Types: Cigarettes  . Smokeless tobacco: Not on file  . Alcohol Use: Yes     Comment: rare    FAMILY HISTORY: Family History  Problem Relation Age of Onset  . Cancer Mother   . Diabetes Father     EXAM:  Triage Vitals: BP 130/72 mmHg  Pulse 85  Temp(Src) 98.6 F (37 C) (Oral)  Resp 20  Ht 6' (1.829 m)  Wt 274 lb (124.286 kg)  BMI 37.15 kg/m2  SpO2 95%  CONSTITUTIONAL: Alert and oriented and responds appropriately to questions. Well-appearing; well-nourished; in NAD, nontoxic HEAD: Normocephalic EYES: Conjunctivae clear, PERRL ENT: normal nose; no rhinorrhea; moist mucous membranes; pharynx without lesions noted NECK: Supple, no meningismus, no LAD  CARD: RRR; S1 and S2 appreciated; no murmurs, no clicks, no rubs, no  gallops RESP: Normal chest excursion without splinting or tachypnea; breath sounds clear and equal bilaterally; no wheezes, no rhonchi, no rales,  ABD/GI: Normal bowel sounds; non-distended; soft, LLQ pain with TTP, no guarding or rebound GU: normal external genitalia, 2 + femoral pulses bilaterally, no scrotal masses or hernia, no testicular pain or swelling BACK:  The back appears normal and is non-tender to palpation, there is  no CVA tenderness EXT: Normal ROM in all joints; non-tender to palpation; no edema; normal capillary refill; no cyanosis    SKIN: Normal color for age and race; warm NEURO: Moves all extremities equally PSYCH: The patient's mood and manner are appropriate. Grooming and personal hygiene are appropriate.  MEDICAL DECISION MAKING: Pt here with LLQ pain.  Diff dx includes diverticulitis, colitis, kidney stone, UTI.  Denies pain meds at this time.  Will obtain labs, urine, CT AP.  ED PROGRESS: Labs shows leukocytosis.  Urine shows hematuria.  CT shows L 72mm kidney stone with mild hydro.  Pt agreed to Toradol and feels better.  WIll dc with urine strainer, Rx for vicodin, ibuprofen, zofran and flomax.  Will give Urology f/u info. Discussed return precautions.  HE verbalized understanding and is comfortable with plan.  I personally performed the services described in this documentation, which was scribed in my presence. The recorded information has been reviewed and is accurate.  [place], DO 05/28/14 [phone]

## 2014-05-27 NOTE — ED Notes (Signed)
Pt c/o llq pain with some nausea.

## 2014-05-27 NOTE — Discharge Instructions (Signed)

## 2014-06-14 ENCOUNTER — Other Ambulatory Visit: Payer: Self-pay | Admitting: Nurse Practitioner

## 2014-06-29 ENCOUNTER — Ambulatory Visit: Payer: BC Managed Care – PPO | Admitting: Nurse Practitioner

## 2014-07-18 ENCOUNTER — Encounter: Payer: Self-pay | Admitting: Nurse Practitioner

## 2014-07-18 ENCOUNTER — Ambulatory Visit (INDEPENDENT_AMBULATORY_CARE_PROVIDER_SITE_OTHER): Payer: BLUE CROSS/BLUE SHIELD | Admitting: Nurse Practitioner

## 2014-07-18 VITALS — BP 113/71 | HR 80 | Temp 97.5°F | Ht 72.0 in | Wt 277.0 lb

## 2014-07-18 DIAGNOSIS — E785 Hyperlipidemia, unspecified: Secondary | ICD-10-CM

## 2014-07-18 DIAGNOSIS — E1100 Type 2 diabetes mellitus with hyperosmolarity without nonketotic hyperglycemic-hyperosmolar coma (NKHHC): Secondary | ICD-10-CM

## 2014-07-18 DIAGNOSIS — E039 Hypothyroidism, unspecified: Secondary | ICD-10-CM

## 2014-07-18 DIAGNOSIS — Z125 Encounter for screening for malignant neoplasm of prostate: Secondary | ICD-10-CM

## 2014-07-18 DIAGNOSIS — E038 Other specified hypothyroidism: Secondary | ICD-10-CM

## 2014-07-18 DIAGNOSIS — E034 Atrophy of thyroid (acquired): Secondary | ICD-10-CM

## 2014-07-18 DIAGNOSIS — I10 Essential (primary) hypertension: Secondary | ICD-10-CM

## 2014-07-18 LAB — POCT GLYCOSYLATED HEMOGLOBIN (HGB A1C): Hemoglobin A1C: 7

## 2014-07-18 MED ORDER — GLIMEPIRIDE 2 MG PO TABS
ORAL_TABLET | ORAL | Status: DC
Start: 2014-07-18 — End: 2014-10-24

## 2014-07-18 MED ORDER — METFORMIN HCL 500 MG PO TABS: ORAL_TABLET | ORAL | Status: DC

## 2014-07-18 MED ORDER — LISINOPRIL-HYDROCHLOROTHIAZIDE 10-12.5 MG PO TABS
1.0000 | ORAL_TABLET | Freq: Every day | ORAL | Status: DC
Start: 2014-07-18 — End: 2014-10-24

## 2014-07-18 MED ORDER — ATORVASTATIN CALCIUM 40 MG PO TABS: ORAL_TABLET | ORAL | Status: DC

## 2014-07-18 MED ORDER — LEVOTHYROXINE SODIUM 125 MCG PO TABS: 125.0000 ug | ORAL_TABLET | Freq: Every day | ORAL | Status: DC

## 2014-07-18 NOTE — Patient Instructions (Signed)

## 2014-07-18 NOTE — Progress Notes (Signed)
Subjective:    Patient ID: Tyler Duran, male    DOB: 09/12/1970, 44 y.o.   MRN: [id number]  Patient here today for follow up of chronic medical problems. No complaints today   Diabetes He presents for his follow-up diabetic visit. He has type 2 diabetes mellitus. No MedicAlert identification noted. His disease course has been stable. Pertinent negatives for hypoglycemia include no headaches. Pertinent negatives for diabetes include no chest pain, no polydipsia, no polyphagia and no polyuria. There are no hypoglycemic complications. Symptoms are stable. There are no diabetic complications. Risk factors for coronary artery disease include diabetes mellitus, dyslipidemia, hypertension, male sex and obesity. Current diabetic treatment includes oral agent (dual therapy). He is compliant with treatment most of the time. His weight is stable. When asked about meal planning, he reported none. He has not had a previous visit with a dietitian. He rarely participates in exercise. There is no change in his home blood glucose trend. His breakfast blood glucose is taken between 9-10 am. His breakfast blood glucose range is generally 140-180 mg/dl. His highest blood glucose is 140-180 mg/dl. (Does not check blood sugars daily) An ACE inhibitor/angiotensin II receptor blocker is being taken. He does not see a podiatrist.Eye exam is not current.  Hypertension This is a chronic problem. The current episode started more than 1 year ago. The problem is unchanged. The problem is controlled. Pertinent negatives include no chest pain, headaches, neck pain, palpitations or shortness of breath. Risk factors for coronary artery disease include diabetes mellitus, dyslipidemia, male gender and obesity. Past treatments include ACE inhibitors and diuretics. The current treatment provides moderate improvement. Compliance problems include diet and exercise.  Hypertensive end-organ damage includes a thyroid problem.   Hyperlipidemia This is a chronic problem. The current episode started more than 1 year ago. The problem is controlled. Recent lipid tests were reviewed and are normal. Exacerbating diseases include diabetes and obesity. He has no history of hypothyroidism. Pertinent negatives include no chest pain or shortness of breath. Current antihyperlipidemic treatment includes statins. The current treatment provides mild improvement of lipids. Compliance problems include adherence to diet and adherence to exercise.  Risk factors for coronary artery disease include diabetes mellitus, dyslipidemia, hypertension, male sex and obesity.  Thyroid Problem Visit type: hypothyroidism. Patient reports no palpitations. The symptoms have been stable. His past medical history is significant for diabetes and hyperlipidemia.      Review of Systems  Constitutional: Negative.   Respiratory: Negative for shortness of breath.   Cardiovascular: Negative for chest pain and palpitations.  Endocrine: Negative for polydipsia, polyphagia and polyuria.  Musculoskeletal: Negative for neck pain.  Neurological: Negative for headaches.  All other systems reviewed and are negative.      Objective:   Physical Exam  Constitutional: He is oriented to person, place, and time. He appears well-developed and well-nourished.  HENT:  Head: Normocephalic.  Right Ear: External ear normal.  Left Ear: External ear normal.  Nose: Nose normal.  Mouth/Throat: Oropharynx is clear and moist.  Eyes: EOM are normal. Pupils are equal, round, and reactive to light.  Neck: Normal range of motion. Neck supple. No JVD present. No thyromegaly present.  Cardiovascular: Normal rate, regular rhythm, normal heart sounds and intact distal pulses.  Exam reveals no gallop and no friction rub.   No murmur heard. Pulmonary/Chest: Effort normal and breath sounds normal. No respiratory distress. He has no wheezes. He has no rales. He exhibits no tenderness.   Abdominal: Soft. Bowel sounds are  normal. He exhibits no mass. There is no tenderness.  Musculoskeletal: Normal range of motion. He exhibits no edema.  Lymphadenopathy:    He has no cervical adenopathy.  Neurological: He is alert and oriented to person, place, and time. No cranial nerve deficit.  Skin: Skin is warm and dry.  Psychiatric: He has a normal mood and affect. His behavior is normal. Judgment and thought content normal.   BP 113/71 mmHg  Pulse 80  Temp(Src) 97.5 F (36.4 C) (Oral)  Ht 6' (1.829 m)  Wt 277 lb (125.646 kg)  BMI 37.56 kg/m2   Results for orders placed or performed in visit on 07/18/14  POCT glycosylated hemoglobin (Hb A1C)  Result Value Ref Range   Hemoglobin A1C 7.0%        Assessment & Plan:  1. Type 2 diabetes mellitus with hyperosmolarity without coma Continue to count cards - POCT glycosylated hemoglobin (Hb A1C) - glimepiride (AMARYL) 2 MG tablet; TAKE 1 TABLET (2 MG TOTAL) BY MOUTH DAILY BEFORE BREAKFAST.  Dispense: 90 tablet; Refill: 1 - metFORMIN (GLUCOPHAGE) 500 MG tablet; TAKE 1 TABLET (500 MG TOTAL) BY MOUTH DAILY WITH BREAKFAST.  Dispense: 90 tablet; Refill: 1  2. Essential hypertension Do not add slat to diet - lisinopril-hydrochlorothiazide (PRINZIDE,ZESTORETIC) 10-12.5 MG per tablet; Take 1 tablet by mouth daily.  Dispense: 90 tablet; Refill: 1  3. Hypothyroidism, unspecified hypothyroidism type - Thyroid Panel With TSH  4. Hyperlipidemia with target LDL less than 100 Low fat diet - atorvastatin (LIPITOR) 40 MG tablet; TAKE 1 TABLET (40 MG TOTAL) BY MOUTH DAILY.  Dispense: 90 tablet; Refill: 1  5. Prostate cancer screening - PSA, total and free  6. Hypothyroidism due to acquired atrophy of thyroid - levothyroxine (SYNTHROID, LEVOTHROID) 125 MCG tablet; Take 1 tablet (125 mcg total) by mouth daily before breakfast.  Dispense: 90 tablet; Refill: 1    Labs pending Health maintenance reviewed Diet and exercise  encouraged Continue all meds Follow up  In 3 months   [place], FNP

## 2014-07-19 LAB — THYROID PANEL WITH TSH
Free Thyroxine Index: 2.1 (ref 1.2–4.9)
T3 Uptake Ratio: 27 % (ref 24–39)
T4, Total: 7.7 ug/dL (ref 4.5–12.0)
TSH: 2.72 u[IU]/mL (ref 0.450–4.500)

## 2014-07-19 LAB — PSA, TOTAL AND FREE
PSA FREE: 0.11 ng/mL
PSA, Free Pct: 27.5 %
PSA: 0.4 ng/mL (ref 0.0–4.0)

## 2014-09-17 ENCOUNTER — Other Ambulatory Visit: Payer: Self-pay | Admitting: Family Medicine

## 2014-10-20 ENCOUNTER — Other Ambulatory Visit: Payer: Self-pay | Admitting: Family Medicine

## 2014-10-21 ENCOUNTER — Other Ambulatory Visit: Payer: Self-pay | Admitting: Nurse Practitioner

## 2014-10-22 ENCOUNTER — Ambulatory Visit: Payer: BLUE CROSS/BLUE SHIELD | Admitting: Nurse Practitioner

## 2014-10-24 ENCOUNTER — Ambulatory Visit (INDEPENDENT_AMBULATORY_CARE_PROVIDER_SITE_OTHER): Payer: BLUE CROSS/BLUE SHIELD | Admitting: Nurse Practitioner

## 2014-10-24 ENCOUNTER — Encounter: Payer: Self-pay | Admitting: Nurse Practitioner

## 2014-10-24 VITALS — BP 116/85 | HR 80 | Temp 97.1°F | Ht 72.0 in | Wt 275.0 lb

## 2014-10-24 DIAGNOSIS — E039 Hypothyroidism, unspecified: Secondary | ICD-10-CM

## 2014-10-24 DIAGNOSIS — E1100 Type 2 diabetes mellitus with hyperosmolarity without nonketotic hyperglycemic-hyperosmolar coma (NKHHC): Secondary | ICD-10-CM | POA: Diagnosis not present

## 2014-10-24 DIAGNOSIS — I10 Essential (primary) hypertension: Secondary | ICD-10-CM

## 2014-10-24 DIAGNOSIS — E785 Hyperlipidemia, unspecified: Secondary | ICD-10-CM | POA: Diagnosis not present

## 2014-10-24 LAB — POCT GLYCOSYLATED HEMOGLOBIN (HGB A1C): Hemoglobin A1C: 7.2

## 2014-10-24 MED ORDER — METFORMIN HCL 500 MG PO TABS: ORAL_TABLET | ORAL | Status: DC

## 2014-10-24 MED ORDER — LEVOTHYROXINE SODIUM 125 MCG PO TABS: 125.0000 ug | ORAL_TABLET | Freq: Every day | ORAL | Status: DC

## 2014-10-24 MED ORDER — LISINOPRIL-HYDROCHLOROTHIAZIDE 10-12.5 MG PO TABS
1.0000 | ORAL_TABLET | Freq: Every day | ORAL | Status: DC
Start: 2014-10-24 — End: 2015-09-04

## 2014-10-24 MED ORDER — ATORVASTATIN CALCIUM 40 MG PO TABS
ORAL_TABLET | ORAL | Status: DC
Start: 2014-10-24 — End: 2015-05-20

## 2014-10-24 MED ORDER — GLIMEPIRIDE 2 MG PO TABS: ORAL_TABLET | ORAL | Status: DC

## 2014-10-24 NOTE — Patient Instructions (Signed)
Fat and Cholesterol Control Diet Fat and cholesterol levels in your blood and organs are influenced by your diet. High levels of fat and cholesterol may lead to diseases of the heart, small and large blood vessels, gallbladder, liver, and pancreas. CONTROLLING FAT AND CHOLESTEROL WITH DIET Although exercise and lifestyle factors are important, your diet is key. That is because certain foods are known to raise cholesterol and others to lower it. The goal is to balance foods for their effect on cholesterol and more importantly, to replace saturated and trans fat with other types of fat, such as monounsaturated fat, polyunsaturated fat, and omega-3 fatty acids. On average, a person should consume no more than 15 to 17 g of saturated fat daily. Saturated and trans fats are considered "bad" fats, and they will raise LDL cholesterol. Saturated fats are primarily found in animal products such as meats, butter, and cream. However, that does not mean you need to give up all your favorite foods. Today, there are good tasting, low-fat, low-cholesterol substitutes for most of the things you like to eat. Choose low-fat or nonfat alternatives. Choose round or loin cuts of red meat. These types of cuts are lowest in fat and cholesterol. Chicken (without the skin), fish, veal, and ground turkey breast are great choices. Eliminate fatty meats, such as hot dogs and salami. Even shellfish have little or no saturated fat. Have a 3 oz (85 g) portion when you eat lean meat, poultry, or fish. Trans fats are also called "partially hydrogenated oils." They are oils that have been scientifically manipulated so that they are solid at room temperature resulting in a longer shelf life and improved taste and texture of foods in which they are added. Trans fats are found in stick margarine, some tub margarines, cookies, crackers, and baked goods.  When baking and cooking, oils are a great substitute for butter. The monounsaturated oils are  especially beneficial since it is believed they lower LDL and raise HDL. The oils you should avoid entirely are saturated tropical oils, such as coconut and palm.  Remember to eat a lot from food groups that are naturally free of saturated and trans fat, including fish, fruit, vegetables, beans, grains (barley, rice, couscous, bulgur wheat), and pasta (without cream sauces).  IDENTIFYING FOODS THAT LOWER FAT AND CHOLESTEROL  Soluble fiber may lower your cholesterol. This type of fiber is found in fruits such as apples, vegetables such as broccoli, potatoes, and carrots, legumes such as beans, peas, and lentils, and grains such as barley. Foods fortified with plant sterols (phytosterol) may also lower cholesterol. You should eat at least 2 g per day of these foods for a cholesterol lowering effect.  Read package labels to identify low-saturated fats, trans fat free, and low-fat foods at the supermarket. Select cheeses that have only 2 to 3 g saturated fat per ounce. Use a heart-healthy tub margarine that is free of trans fats or partially hydrogenated oil. When buying baked goods (cookies, crackers), avoid partially hydrogenated oils. Breads and muffins should be made from whole grains (whole-wheat or whole oat flour, instead of "flour" or "enriched flour"). Buy non-creamy canned soups with reduced salt and no added fats.  FOOD PREPARATION TECHNIQUES  Never deep-fry. If you must fry, either stir-fry, which uses very little fat, or use non-stick cooking sprays. When possible, broil, bake, or roast meats, and steam vegetables. Instead of putting butter or margarine on vegetables, use lemon and herbs, applesauce, and cinnamon (for squash and sweet potatoes). Use nonfat   yogurt, salsa, and low-fat dressings for salads.  LOW-SATURATED FAT / LOW-FAT FOOD SUBSTITUTES Meats / Saturated Fat (g)  Avoid: Steak, marbled (3 oz/85 g) / 11 g  Choose: Steak, lean (3 oz/85 g) / 4 g  Avoid: Hamburger (3 oz/85 g) / 7  g  Choose: Hamburger, lean (3 oz/85 g) / 5 g  Avoid: Ham (3 oz/85 g) / 6 g  Choose: Ham, lean cut (3 oz/85 g) / 2.4 g  Avoid: Chicken, with skin, dark meat (3 oz/85 g) / 4 g  Choose: Chicken, skin removed, dark meat (3 oz/85 g) / 2 g  Avoid: Chicken, with skin, light meat (3 oz/85 g) / 2.5 g  Choose: Chicken, skin removed, light meat (3 oz/85 g) / 1 g Dairy / Saturated Fat (g)  Avoid: Whole milk (1 cup) / 5 g  Choose: Low-fat milk, 2% (1 cup) / 3 g  Choose: Low-fat milk, 1% (1 cup) / 1.5 g  Choose: Skim milk (1 cup) / 0.3 g  Avoid: Hard cheese (1 oz/28 g) / 6 g  Choose: Skim milk cheese (1 oz/28 g) / 2 to 3 g  Avoid: Cottage cheese, 4% fat (1 cup) / 6.5 g  Choose: Low-fat cottage cheese, 1% fat (1 cup) / 1.5 g  Avoid: Ice cream (1 cup) / 9 g  Choose: Sherbet (1 cup) / 2.5 g  Choose: Nonfat frozen yogurt (1 cup) / 0.3 g  Choose: Frozen fruit bar / trace  Avoid: Whipped cream (1 tbs) / 3.5 g  Choose: Nondairy whipped topping (1 tbs) / 1 g Condiments / Saturated Fat (g)  Avoid: Mayonnaise (1 tbs) / 2 g  Choose: Low-fat mayonnaise (1 tbs) / 1 g  Avoid: Butter (1 tbs) / 7 g  Choose: Extra light margarine (1 tbs) / 1 g  Avoid: Coconut oil (1 tbs) / 11.8 g  Choose: Olive oil (1 tbs) / 1.8 g  Choose: Corn oil (1 tbs) / 1.7 g  Choose: Safflower oil (1 tbs) / 1.2 g  Choose: Sunflower oil (1 tbs) / 1.4 g  Choose: Soybean oil (1 tbs) / 2.4 g  Choose: Canola oil (1 tbs) / 1 g Document Released: 05/25/2005 Document Revised: 09/19/2012 Document Reviewed: 08/23/2013 [employer] Patient Information 2015 [employer]. This information is not intended to replace advice given to you by your health care provider. Make sure you discuss any questions you have with your health care provider.  

## 2014-10-24 NOTE — Progress Notes (Signed)
Subjective:    Patient ID: Tyler Duran, male    DOB: 1970-10-08, 44 y.o.   MRN: [id number]  Patient here today for follow up of chronic medical problems. No complaints today   Diabetes He presents for his follow-up diabetic visit. He has type 2 diabetes mellitus. No MedicAlert identification noted. His disease course has been stable. Pertinent negatives for hypoglycemia include no headaches. Pertinent negatives for diabetes include no chest pain, no polydipsia, no polyphagia and no polyuria. There are no hypoglycemic complications. Symptoms are stable. There are no diabetic complications. Risk factors for coronary artery disease include diabetes mellitus, dyslipidemia, hypertension, male sex and obesity. Current diabetic treatment includes oral agent (dual therapy). He is compliant with treatment most of the time. His weight is stable. When asked about meal planning, he reported none. He has not had a previous visit with a dietitian. He rarely participates in exercise. There is no change in his home blood glucose trend. His breakfast blood glucose is taken between 9-10 am. His breakfast blood glucose range is generally 140-180 mg/dl. His highest blood glucose is >200 mg/dl. His overall blood glucose range is 140-180 mg/dl. (Does not check blood sugars daily) An ACE inhibitor/angiotensin II receptor blocker is being taken. He does not see a podiatrist.Eye exam is not current.  Hypertension This is a chronic problem. The current episode started more than 1 year ago. The problem is unchanged. The problem is controlled. Pertinent negatives include no chest pain, headaches, neck pain, palpitations or shortness of breath. Risk factors for coronary artery disease include diabetes mellitus, dyslipidemia, male gender and obesity. Past treatments include ACE inhibitors and diuretics. The current treatment provides moderate improvement. Compliance problems include diet and exercise.  Hypertensive end-organ damage  includes a thyroid problem.  Hyperlipidemia This is a chronic problem. The current episode started more than 1 year ago. The problem is controlled. Recent lipid tests were reviewed and are normal. Exacerbating diseases include diabetes and obesity. He has no history of hypothyroidism. Pertinent negatives include no chest pain or shortness of breath. Current antihyperlipidemic treatment includes statins. The current treatment provides mild improvement of lipids. Compliance problems include adherence to diet and adherence to exercise.  Risk factors for coronary artery disease include diabetes mellitus, dyslipidemia, hypertension, male sex and obesity.  Thyroid Problem Visit type: hypothyroidism. Patient reports no palpitations. The symptoms have been stable. His past medical history is significant for diabetes and hyperlipidemia.      Review of Systems  Constitutional: Negative.   Respiratory: Negative for shortness of breath.   Cardiovascular: Negative for chest pain and palpitations.  Endocrine: Negative for polydipsia, polyphagia and polyuria.  Musculoskeletal: Negative for neck pain.  Neurological: Negative for headaches.  All other systems reviewed and are negative.      Objective:   Physical Exam  Constitutional: He is oriented to person, place, and time. He appears well-developed and well-nourished.  HENT:  Head: Normocephalic.  Right Ear: External ear normal.  Left Ear: External ear normal.  Nose: Nose normal.  Mouth/Throat: Oropharynx is clear and moist.  Eyes: EOM are normal. Pupils are equal, round, and reactive to light.  Neck: Normal range of motion. Neck supple. No JVD present. No thyromegaly present.  Cardiovascular: Normal rate, regular rhythm, normal heart sounds and intact distal pulses.  Exam reveals no gallop and no friction rub.   No murmur heard. Pulmonary/Chest: Effort normal and breath sounds normal. No respiratory distress. He has no wheezes. He has no rales.  He exhibits  no tenderness.  Abdominal: Soft. Bowel sounds are normal. He exhibits no mass. There is no tenderness.  Musculoskeletal: Normal range of motion. He exhibits no edema.  Lymphadenopathy:    He has no cervical adenopathy.  Neurological: He is alert and oriented to person, place, and time. No cranial nerve deficit.  Skin: Skin is warm and dry.  Psychiatric: He has a normal mood and affect. His behavior is normal. Judgment and thought content normal.   BP 116/85 mmHg  Pulse 80  Temp(Src) 97.1 F (36.2 C) (Oral)  Ht 6' (1.829 m)  Wt 275 lb (124.739 kg)  BMI 37.29 kg/m2   Results for orders placed or performed in visit on 10/24/14  POCT glycosylated hemoglobin (Hb A1C)  Result Value Ref Range   Hemoglobin A1C 7.2        Assessment & Plan:  1. Type 2 diabetes mellitus with hyperosmolarity without coma Continue  To count cabs - POCT glycosylated hemoglobin (Hb A1C) - metFORMIN (GLUCOPHAGE) 500 MG tablet; TAKE 1 TABLET (500 MG TOTAL) BY MOUTH DAILY WITH BREAKFAST.  Dispense: 90 tablet; Refill: 1 - glimepiride (AMARYL) 2 MG tablet; TAKE 1 TABLET (2 MG TOTAL) BY MOUTH DAILY BEFORE BREAKFAST.  Dispense: 90 tablet; Refill: 1  2. Essential hypertension Do not add salt to diet - CMP14+EGFR - lisinopril-hydrochlorothiazide (PRINZIDE,ZESTORETIC) 10-12.5 MG per tablet; Take 1 tablet by mouth daily.  Dispense: 90 tablet; Refill: 1  3. Hyperlipidemia with target LDL less than 100 Low fat diet - NMR, lipoprofile - atorvastatin (LIPITOR) 40 MG tablet; TAKE 1 TABLET (40 MG TOTAL) BY MOUTH DAILY.  Dispense: 90 tablet; Refill: 1  4. Hypothyroidism, unspecified hypothyroidism type - Thyroid Panel With TSH - levothyroxine (SYNTHROID, LEVOTHROID) 125 MCG tablet; Take 1 tablet (125 mcg total) by mouth daily before breakfast.  Dispense: 90 tablet; Refill: 1    Labs pending Health maintenance reviewed Diet and exercise encouraged Continue all meds Follow up  In 3 month    [place], FNP

## 2014-10-25 LAB — CMP14+EGFR
A/G RATIO: 1.6 (ref 1.1–2.5)
ALBUMIN: 4 g/dL (ref 3.5–5.5)
ALT: 31 IU/L (ref 0–44)
AST: 21 IU/L (ref 0–40)
Alkaline Phosphatase: 98 IU/L (ref 39–117)
BUN/Creatinine Ratio: 14 (ref 9–20)
BUN: 15 mg/dL (ref 6–24)
Bilirubin Total: 0.4 mg/dL (ref 0.0–1.2)
CO2: 22 mmol/L (ref 18–29)
CREATININE: 1.09 mg/dL (ref 0.76–1.27)
Calcium: 9.2 mg/dL (ref 8.7–10.2)
Chloride: 102 mmol/L (ref 97–108)
GFR calc non Af Amer: 82 mL/min/{1.73_m2} (ref >59)
GFR, EST AFRICAN AMERICAN: 94 mL/min/{1.73_m2} (ref >59)
GLOBULIN, TOTAL: 2.5 g/dL (ref 1.5–4.5)
Glucose: 120 mg/dL — ABNORMAL HIGH (ref 65–99)
Potassium: 4.4 mmol/L (ref 3.5–5.2)
SODIUM: 142 mmol/L (ref 134–144)
Total Protein: 6.5 g/dL (ref 6.0–8.5)

## 2014-10-25 LAB — NMR, LIPOPROFILE
CHOLESTEROL: 141 mg/dL (ref 100–199)
HDL CHOLESTEROL BY NMR: 30 mg/dL — AB (ref >39)
HDL PARTICLE NUMBER: 22.8 umol/L — AB (ref >=30.5)
LDL PARTICLE NUMBER: 922 nmol/L (ref <1000)
LDL SIZE: 20 nm (ref >20.5)
LDL-C: 70 mg/dL (ref 0–99)
LP-IR SCORE: 66 — AB (ref <=45)
Small LDL Particle Number: 631 nmol/L — ABNORMAL HIGH (ref <=527)
TRIGLYCERIDES BY NMR: 203 mg/dL — AB (ref 0–149)

## 2015-02-04 ENCOUNTER — Encounter: Payer: Self-pay | Admitting: Nurse Practitioner

## 2015-02-04 ENCOUNTER — Ambulatory Visit (INDEPENDENT_AMBULATORY_CARE_PROVIDER_SITE_OTHER): Payer: BLUE CROSS/BLUE SHIELD | Admitting: Nurse Practitioner

## 2015-02-04 VITALS — BP 124/81 | HR 95 | Temp 97.3°F | Ht 72.0 in | Wt 279.0 lb

## 2015-02-04 DIAGNOSIS — E039 Hypothyroidism, unspecified: Secondary | ICD-10-CM | POA: Diagnosis not present

## 2015-02-04 DIAGNOSIS — E1100 Type 2 diabetes mellitus with hyperosmolarity without nonketotic hyperglycemic-hyperosmolar coma (NKHHC): Secondary | ICD-10-CM | POA: Diagnosis not present

## 2015-02-04 DIAGNOSIS — M79644 Pain in right finger(s): Secondary | ICD-10-CM

## 2015-02-04 DIAGNOSIS — I10 Essential (primary) hypertension: Secondary | ICD-10-CM | POA: Diagnosis not present

## 2015-02-04 DIAGNOSIS — E785 Hyperlipidemia, unspecified: Secondary | ICD-10-CM

## 2015-02-04 LAB — POCT GLYCOSYLATED HEMOGLOBIN (HGB A1C): Hemoglobin A1C: 8.5

## 2015-02-04 LAB — POCT UA - MICROALBUMIN: Microalbumin Ur, POC: 50 mg/L

## 2015-02-04 MED ORDER — METFORMIN HCL 1000 MG PO TABS: 1000.0000 mg | ORAL_TABLET | Freq: Two times a day (BID) | ORAL | Status: DC

## 2015-02-04 NOTE — Progress Notes (Signed)
Subjective:    Patient ID: Tyler Duran, male    DOB: 09-10-1970, 44 y.o.   MRN: [id number]  Patient here today for follow up of chronic medical problems.  * His only complaint today is numbness tingling and pain in right index ring and middle finger. Started about 6 weeks ago and is affecting his grip slightly. Wakes up at night with fingers feeling ike they were asleep.   Diabetes He presents for his follow-up diabetic visit. He has type 2 diabetes mellitus. No MedicAlert identification noted. His disease course has been stable. Pertinent negatives for hypoglycemia include no headaches. Pertinent negatives for diabetes include no chest pain, no polydipsia, no polyphagia and no polyuria. There are no hypoglycemic complications. Symptoms are stable. There are no diabetic complications. Risk factors for coronary artery disease include diabetes mellitus, dyslipidemia, hypertension, male sex and obesity. Current diabetic treatment includes oral agent (dual therapy). He is compliant with treatment most of the time. His weight is stable. When asked about meal planning, he reported none. He has not had a previous visit with a dietitian. He rarely participates in exercise. There is no change in his home blood glucose trend. His breakfast blood glucose is taken between 9-10 am. His breakfast blood glucose range is generally 140-180 mg/dl. His highest blood glucose is >200 mg/dl. His overall blood glucose range is 140-180 mg/dl. (Does not check blood sugars daily) An ACE inhibitor/angiotensin II receptor blocker is being taken. He does not see a podiatrist.Eye exam is not current.  Hypertension This is a chronic problem. The current episode started more than 1 year ago. The problem is unchanged. The problem is controlled. Pertinent negatives include no chest pain, headaches, neck pain, palpitations or shortness of breath. Risk factors for coronary artery disease include diabetes mellitus, dyslipidemia, male  gender and obesity. Past treatments include ACE inhibitors and diuretics. The current treatment provides moderate improvement. Compliance problems include diet and exercise.  Hypertensive end-organ damage includes a thyroid problem.  Hyperlipidemia This is a chronic problem. The current episode started more than 1 year ago. The problem is controlled. Recent lipid tests were reviewed and are normal. Exacerbating diseases include diabetes and obesity. He has no history of hypothyroidism. Pertinent negatives include no chest pain or shortness of breath. Current antihyperlipidemic treatment includes statins. The current treatment provides mild improvement of lipids. Compliance problems include adherence to diet and adherence to exercise.  Risk factors for coronary artery disease include diabetes mellitus, dyslipidemia, hypertension, male sex and obesity.  Thyroid Problem Visit type: hypothyroidism. Patient reports no palpitations. The symptoms have been stable. His past medical history is significant for diabetes and hyperlipidemia.      Review of Systems  Constitutional: Negative.   Respiratory: Negative for shortness of breath.   Cardiovascular: Negative for chest pain and palpitations.  Endocrine: Negative for polydipsia, polyphagia and polyuria.  Musculoskeletal: Negative for neck pain.  Neurological: Negative for headaches.  All other systems reviewed and are negative.      Objective:   Physical Exam  Constitutional: He is oriented to person, place, and time. He appears well-developed and well-nourished.  HENT:  Head: Normocephalic.  Right Ear: External ear normal.  Left Ear: External ear normal.  Nose: Nose normal.  Mouth/Throat: Oropharynx is clear and moist.  Eyes: EOM are normal. Pupils are equal, round, and reactive to light.  Neck: Normal range of motion. Neck supple. No JVD present. No thyromegaly present.  Cardiovascular: Normal rate, regular rhythm, normal heart sounds and  intact distal pulses.  Exam reveals no gallop and no friction rub.   No murmur heard. Pulmonary/Chest: Effort normal and breath sounds normal. No respiratory distress. He has no wheezes. He has no rales. He exhibits no tenderness.  Abdominal: Soft. Bowel sounds are normal. He exhibits no mass. There is no tenderness.  Musculoskeletal: Normal range of motion. He exhibits no edema.  Negative phalen Negative tinel Grips equal bil  Lymphadenopathy:    He has no cervical adenopathy.  Neurological: He is alert and oriented to person, place, and time. No cranial nerve deficit.  Skin: Skin is warm and dry.  Psychiatric: He has a normal mood and affect. His behavior is normal. Judgment and thought content normal.   BP 124/81 mmHg  Pulse 95  Temp(Src) 97.3 F (36.3 C) (Oral)  Ht 6' (1.829 m)  Wt 279 lb (126.554 kg)  BMI 37.83 kg/m2   Results for orders placed or performed in visit on 02/04/15  POCT glycosylated hemoglobin (Hb A1C)  Result Value Ref Range   Hemoglobin A1C 8.5         Assessment & Plan:  1. Type 2 diabetes mellitus with hyperosmolarity without coma Stricter carb counting - POCT glycosylated hemoglobin (Hb A1C) - POCT UA - Microalbumin  2. Essential hypertension Do not add salt to diet - CMP14+EGFR  3. Hyperlipidemia with target LDL less than 100 Low fat diet and exercise encouraged - Lipid panel  4. Hypothyroidism, unspecified hypothyroidism type  5. Finger pain, right - referral to ortho   Labs pending Health maintenance reviewed Diet and exercise encouraged Continue all meds Follow up  In 3 months   [place], FNP

## 2015-02-04 NOTE — Patient Instructions (Signed)

## 2015-02-04 NOTE — Addendum Note (Signed)
Addended by: Wyline Mood on: 02/04/2015 08:48 AM   Modules accepted: Orders

## 2015-02-05 LAB — LIPID PANEL
CHOLESTEROL TOTAL: 148 mg/dL (ref 100–199)
Chol/HDL Ratio: 4.1 ratio units (ref 0.0–5.0)
HDL: 36 mg/dL — ABNORMAL LOW (ref >39)
LDL CALC: 66 mg/dL (ref 0–99)
TRIGLYCERIDES: 231 mg/dL — AB (ref 0–149)
VLDL Cholesterol Cal: 46 mg/dL — ABNORMAL HIGH (ref 5–40)

## 2015-02-05 LAB — CMP14+EGFR
ALT: 38 IU/L (ref 0–44)
AST: 18 IU/L (ref 0–40)
Albumin/Globulin Ratio: 1.4 (ref 1.1–2.5)
Albumin: 4.1 g/dL (ref 3.5–5.5)
Alkaline Phosphatase: 116 IU/L (ref 39–117)
BUN/Creatinine Ratio: 13 (ref 9–20)
BUN: 14 mg/dL (ref 6–24)
Bilirubin Total: 0.4 mg/dL (ref 0.0–1.2)
CHLORIDE: 99 mmol/L (ref 97–108)
CO2: 23 mmol/L (ref 18–29)
CREATININE: 1.04 mg/dL (ref 0.76–1.27)
Calcium: 9.6 mg/dL (ref 8.7–10.2)
GFR calc Af Amer: 100 mL/min/{1.73_m2} (ref >59)
GFR calc non Af Amer: 86 mL/min/{1.73_m2} (ref >59)
GLOBULIN, TOTAL: 2.9 g/dL (ref 1.5–4.5)
GLUCOSE: 245 mg/dL — AB (ref 65–99)
Potassium: 4.3 mmol/L (ref 3.5–5.2)
SODIUM: 139 mmol/L (ref 134–144)
Total Protein: 7 g/dL (ref 6.0–8.5)

## 2015-02-05 LAB — MICROALBUMIN, URINE: Microalbumin, Urine: 14.9 ug/mL

## 2015-05-20 ENCOUNTER — Encounter: Payer: Self-pay | Admitting: Nurse Practitioner

## 2015-05-20 ENCOUNTER — Ambulatory Visit (INDEPENDENT_AMBULATORY_CARE_PROVIDER_SITE_OTHER): Payer: BLUE CROSS/BLUE SHIELD | Admitting: Nurse Practitioner

## 2015-05-20 DIAGNOSIS — E039 Hypothyroidism, unspecified: Secondary | ICD-10-CM

## 2015-05-20 DIAGNOSIS — I10 Essential (primary) hypertension: Secondary | ICD-10-CM

## 2015-05-20 DIAGNOSIS — E119 Type 2 diabetes mellitus without complications: Secondary | ICD-10-CM

## 2015-05-20 DIAGNOSIS — Z6837 Body mass index (BMI) 37.0-37.9, adult: Secondary | ICD-10-CM | POA: Diagnosis not present

## 2015-05-20 DIAGNOSIS — E785 Hyperlipidemia, unspecified: Secondary | ICD-10-CM

## 2015-05-20 LAB — POCT GLYCOSYLATED HEMOGLOBIN (HGB A1C): Hemoglobin A1C: 6.7

## 2015-05-20 MED ORDER — ATORVASTATIN CALCIUM 40 MG PO TABS: ORAL_TABLET | ORAL | Status: DC

## 2015-05-20 MED ORDER — GLIMEPIRIDE 2 MG PO TABS: ORAL_TABLET | ORAL | Status: DC

## 2015-05-20 MED ORDER — LEVOTHYROXINE SODIUM 125 MCG PO TABS: 125.0000 ug | ORAL_TABLET | Freq: Every day | ORAL | Status: DC

## 2015-05-20 MED ORDER — METFORMIN HCL 1000 MG PO TABS: 1000.0000 mg | ORAL_TABLET | Freq: Two times a day (BID) | ORAL | Status: DC

## 2015-05-20 NOTE — Progress Notes (Signed)
Subjective:    Patient ID: Tyler Duran, male    DOB: [birth year], 44 y.o.   MRN: [id number]  Patient here today for follow up of chronic medical problems.    Diabetes He presents for his follow-up diabetic visit. He has type 2 diabetes mellitus. No MedicAlert identification noted. His disease course has been stable. Pertinent negatives for hypoglycemia include no headaches. Pertinent negatives for diabetes include no chest pain, no polydipsia, no polyphagia and no polyuria. There are no hypoglycemic complications. Symptoms are stable. There are no diabetic complications. Risk factors for coronary artery disease include diabetes mellitus, dyslipidemia, hypertension, male sex and obesity. Current diabetic treatment includes oral agent (dual therapy). He is compliant with treatment most of the time. His weight is stable. When asked about meal planning, he reported none. He has not had a previous visit with a dietitian. He rarely participates in exercise. There is no change in his home blood glucose trend. His breakfast blood glucose is taken between 9-10 am. His breakfast blood glucose range is generally 140-180 mg/dl. His highest blood glucose is >200 mg/dl. His overall blood glucose range is 140-180 mg/dl. (Does not check blood sugars daily) An ACE inhibitor/angiotensin II receptor blocker is being taken. He does not see a podiatrist.Eye exam is not current.  Hypertension This is a chronic problem. The current episode started more than 1 year ago. The problem is unchanged. The problem is controlled. Pertinent negatives include no chest pain, headaches, neck pain, palpitations or shortness of breath. Risk factors for coronary artery disease include diabetes mellitus, dyslipidemia, male gender and obesity. Past treatments include ACE inhibitors and diuretics. The current treatment provides moderate improvement. Compliance problems include diet and exercise.  Hypertensive end-organ damage includes a  thyroid problem.  Hyperlipidemia This is a chronic problem. The current episode started more than 1 year ago. The problem is controlled. Recent lipid tests were reviewed and are normal. Exacerbating diseases include diabetes and obesity. He has no history of hypothyroidism. Pertinent negatives include no chest pain or shortness of breath. Current antihyperlipidemic treatment includes statins. The current treatment provides mild improvement of lipids. Compliance problems include adherence to diet and adherence to exercise.  Risk factors for coronary artery disease include diabetes mellitus, dyslipidemia, hypertension, male sex and obesity.  Thyroid Problem Visit type: hypothyroidism. Patient reports no palpitations. The symptoms have been stable. His past medical history is significant for diabetes and hyperlipidemia.      Review of Systems  Constitutional: Negative.   Respiratory: Negative for shortness of breath.   Cardiovascular: Negative for chest pain and palpitations.  Endocrine: Negative for polydipsia, polyphagia and polyuria.  Musculoskeletal: Negative for neck pain.  Neurological: Negative for headaches.  All other systems reviewed and are negative.      Objective:   Physical Exam  Constitutional: He is oriented to person, place, and time. He appears well-developed and well-nourished.  HENT:  Head: Normocephalic.  Right Ear: External ear normal.  Left Ear: External ear normal.  Nose: Nose normal.  Mouth/Throat: Oropharynx is clear and moist.  Eyes: EOM are normal. Pupils are equal, round, and reactive to light.  Neck: Normal range of motion. Neck supple. No JVD present. No thyromegaly present.  Cardiovascular: Normal rate, regular rhythm, normal heart sounds and intact distal pulses.  Exam reveals no gallop and no friction rub.   No murmur heard. Pulmonary/Chest: Effort normal and breath sounds normal. No respiratory distress. He has no wheezes. He has no rales. He exhibits  no tenderness.  Abdominal: Soft. Bowel sounds are normal. He exhibits no mass. There is no tenderness.  Musculoskeletal: Normal range of motion. He exhibits no edema.  Negative phalen Negative tinel Grips equal bil  Lymphadenopathy:    He has no cervical adenopathy.  Neurological: He is alert and oriented to person, place, and time. No cranial nerve deficit.  Skin: Skin is warm and dry.  Psychiatric: He has a normal mood and affect. His behavior is normal. Judgment and thought content normal.   BP 122/67 mmHg  Pulse 82  Temp(Src) 98.1 F (36.7 C) (Oral)  Ht 6' (1.829 m)  Wt 279 lb 4 oz (126.667 kg)  BMI 37.86 kg/m2   Results for orders placed or performed in visit on 05/20/15  POCT glycosylated hemoglobin (Hb A1C)  Result Value Ref Range   Hemoglobin A1C 6.7         Assessment & Plan:  1. Essential hypertension Do not add saltt o diet - CMP14+EGFR  2. Type 2 diabetes mellitus without complication, without long-term current use of insulin (HCC) Continue to watch carbs oin diet - POCT glycosylated hemoglobin (Hb A1C) - metFORMIN (GLUCOPHAGE) 1000 MG tablet; Take 1 tablet (1,000 mg total) by mouth 2 (two) times daily with a meal.  Dispense: 180 tablet; Refill: 3 - glimepiride (AMARYL) 2 MG tablet; TAKE 1 TABLET (2 MG TOTAL) BY MOUTH DAILY BEFORE BREAKFAST.  Dispense: 90 tablet; Refill: 1  3. Hypothyroidism, unspecified hypothyroidism type - levothyroxine (SYNTHROID, LEVOTHROID) 125 MCG tablet; Take 1 tablet (125 mcg total) by mouth daily before breakfast.  Dispense: 90 tablet; Refill: 1  4. Hyperlipidemia with target LDL less than 100 Low fta diet - atorvastatin (LIPITOR) 40 MG tablet; TAKE 1 TABLET (40 MG TOTAL) BY MOUTH DAILY.  Dispense: 90 tablet; Refill: 1 - Lipid panel  5. BMI 37.0-37.9, adult Discussed diet and exercise for person with BMI >25 Will recheck weight in 3-6 months      Labs pending Health maintenance reviewed Diet and exercise  encouraged Continue all meds Follow up  In 3 months   [place], FNP

## 2015-05-20 NOTE — Patient Instructions (Signed)

## 2015-05-21 LAB — CMP14+EGFR
A/G RATIO: 1.8 (ref 1.1–2.5)
ALK PHOS: 109 IU/L (ref 39–117)
ALT: 40 IU/L (ref 0–44)
AST: 20 IU/L (ref 0–40)
Albumin: 4.2 g/dL (ref 3.5–5.5)
BILIRUBIN TOTAL: 0.4 mg/dL (ref 0.0–1.2)
BUN/Creatinine Ratio: 14 (ref 9–20)
BUN: 14 mg/dL (ref 6–24)
CHLORIDE: 99 mmol/L (ref 96–106)
CO2: 27 mmol/L (ref 18–29)
Calcium: 9.4 mg/dL (ref 8.7–10.2)
Creatinine, Ser: 0.97 mg/dL (ref 0.76–1.27)
GFR calc non Af Amer: 93 mL/min/{1.73_m2} (ref >59)
GFR, EST AFRICAN AMERICAN: 108 mL/min/{1.73_m2} (ref >59)
Globulin, Total: 2.4 g/dL (ref 1.5–4.5)
Glucose: 139 mg/dL — ABNORMAL HIGH (ref 65–99)
POTASSIUM: 4.7 mmol/L (ref 3.5–5.2)
Sodium: 140 mmol/L (ref 134–144)
Total Protein: 6.6 g/dL (ref 6.0–8.5)

## 2015-05-21 LAB — LIPID PANEL
CHOLESTEROL TOTAL: 123 mg/dL (ref 100–199)
Chol/HDL Ratio: 3.6 ratio units (ref 0.0–5.0)
HDL: 34 mg/dL — ABNORMAL LOW (ref >39)
LDL Calculated: 53 mg/dL (ref 0–99)
Triglycerides: 178 mg/dL — ABNORMAL HIGH (ref 0–149)
VLDL CHOLESTEROL CAL: 36 mg/dL (ref 5–40)

## 2015-06-28 ENCOUNTER — Other Ambulatory Visit: Payer: BLUE CROSS/BLUE SHIELD

## 2015-06-28 ENCOUNTER — Ambulatory Visit (INDEPENDENT_AMBULATORY_CARE_PROVIDER_SITE_OTHER): Payer: BLUE CROSS/BLUE SHIELD | Admitting: Family Medicine

## 2015-06-28 ENCOUNTER — Ambulatory Visit (INDEPENDENT_AMBULATORY_CARE_PROVIDER_SITE_OTHER): Payer: BLUE CROSS/BLUE SHIELD

## 2015-06-28 ENCOUNTER — Other Ambulatory Visit: Payer: Self-pay | Admitting: Family Medicine

## 2015-06-28 VITALS — BP 132/82 | HR 97 | Temp 96.9°F | Ht 72.0 in | Wt 276.0 lb

## 2015-06-28 DIAGNOSIS — M25572 Pain in left ankle and joints of left foot: Secondary | ICD-10-CM

## 2015-06-28 DIAGNOSIS — R52 Pain, unspecified: Secondary | ICD-10-CM

## 2015-06-28 MED ORDER — INDOMETHACIN 50 MG PO CAPS: 50.0000 mg | ORAL_CAPSULE | Freq: Three times a day (TID) | ORAL | Status: DC | PRN

## 2015-06-28 NOTE — Patient Instructions (Signed)
Great to meet you!  I am treating you for a sprain  Rest as much as possible Use an ankle sleeve and good supportive shoes Take indomethacin 3 times daily with meals for 5 days, then as needed

## 2015-06-28 NOTE — Progress Notes (Addendum)
   HPI  Patient presents today here with left lateral ankle pain.  Patient's (have mild ankle pain intermittently for about 3 days This morning he woke up with excruciating pain that he couldn't walk on. It's gradually gotten better throughout the day He has no history of injury He does have gout, however this is very different.  He manages [employer], coaches basketball, and is also a Airline pilot. He does not remember any injuries.  PMH: Smoking status noted ROS: Per HPI  Objective: BP 132/82 mmHg  Pulse 97  Temp(Src) 96.9 F (36.1 C) (Oral)  Ht 6' (1.829 m)  Wt 276 lb (125.193 kg)  BMI 37.42 kg/m2 Gen: NAD, alert, cooperative with exam HEENT: NCAT Neuro: Alert and oriented, No gross deficits MSK:  Left lateral tenderness to palpation over the anterior lateral ligaments, mild tenderness with inversion No joint laxity Walking with a limp  Assessment and plan:  # Ankle pain Treating as an overuse injury Rest, compression, scheduled NSAIDs 1 week Refer to sports medicine if not improving within a week or 2   Meds ordered this encounter  Medications  . indomethacin (INDOCIN) 50 MG capsule    Sig: Take 1 capsule (50 mg total) by mouth 3 (three) times daily with meals as needed.    Dispense:  30 capsule    Refill:  [place], MD [facility] 06/28/2015, 5:19 PM

## 2015-08-23 IMAGING — CT CT ABD-PELV W/ CM
2 of 5 series · 16 of 46 positions shown, 18 images · IV contrast (Omnipaque 300)
Comparison: 07/10/2013

CLINICAL DATA: Left lower quadrant abdominal pain for 1 day. Prior
appendectomy, cholecystectomy.

EXAM:
CT ABDOMEN AND PELVIS WITH CONTRAST
TECHNIQUE: Multidetector CT imaging of the abdomen and pelvis was performed
using the standard protocol following bolus administration of
intravenous contrast.
CONTRAST:  50mL OMNIPAQUE IOHEXOL 300 MG/ML SOLN, 100mL OMNIPAQUE
IOHEXOL 300 MG/ML SOLN

[Series 2: abd_pel_with 5.0 b40f · axial · 0.80mm/px · z∈[+532,+978]mm · 13 of 101 slices shown, 15 images]
[im 6/101  soft-tissue]
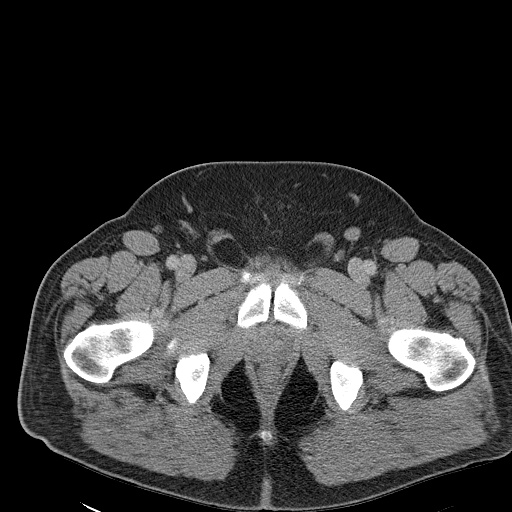
[im 6/101  bone]
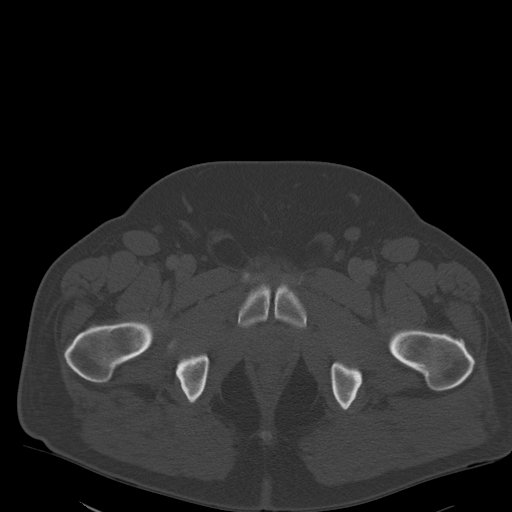
[im 12/101  soft-tissue]
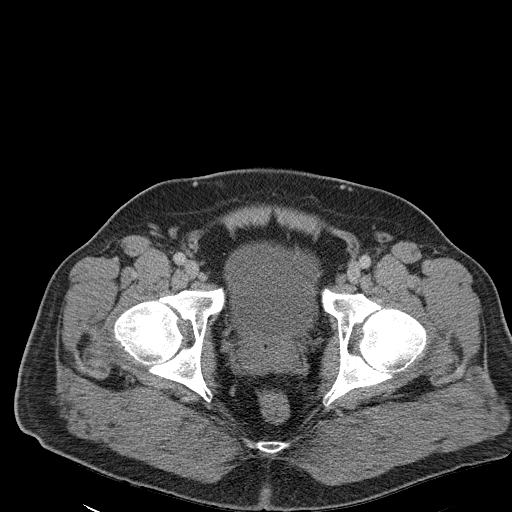
[im 24/101  soft-tissue]
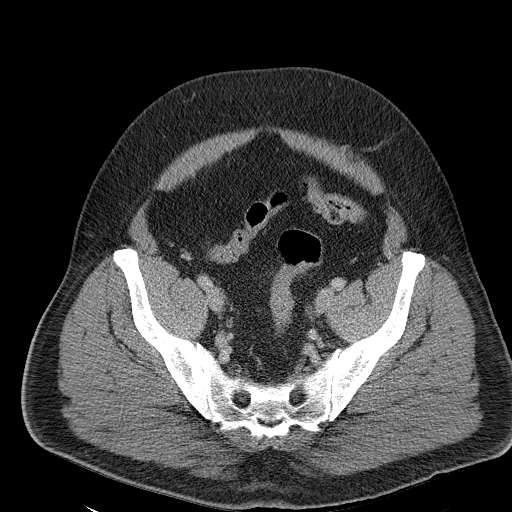
[im 30/101  soft-tissue]
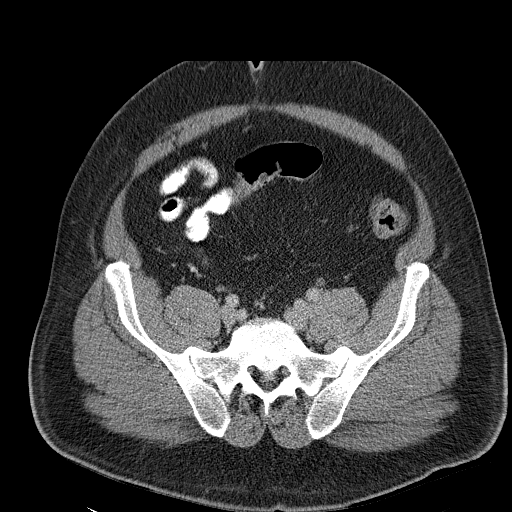
[im 36/101  soft-tissue]
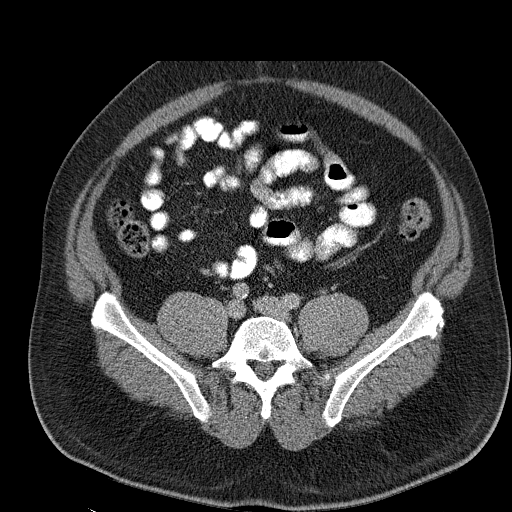
[im 42/101  soft-tissue]
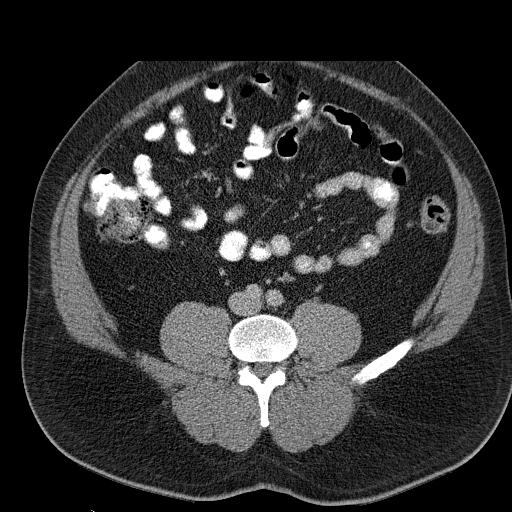
[im 53/101  soft-tissue]
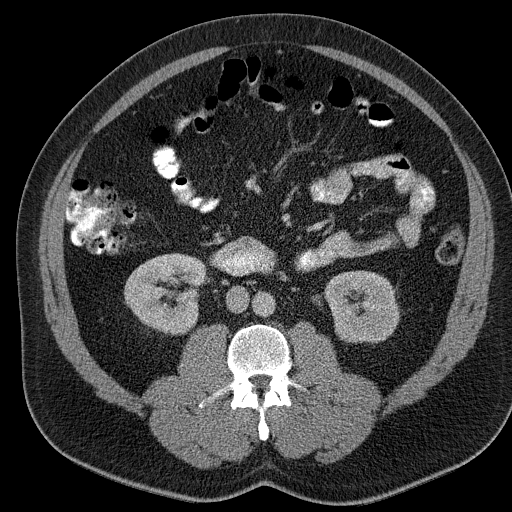
[im 59/101  soft-tissue]
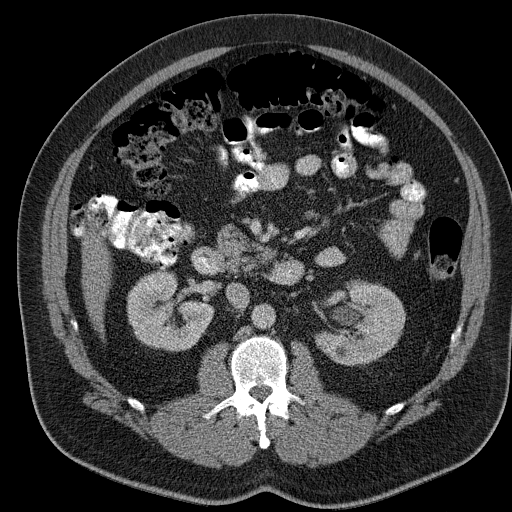
[im 65/101  soft-tissue]
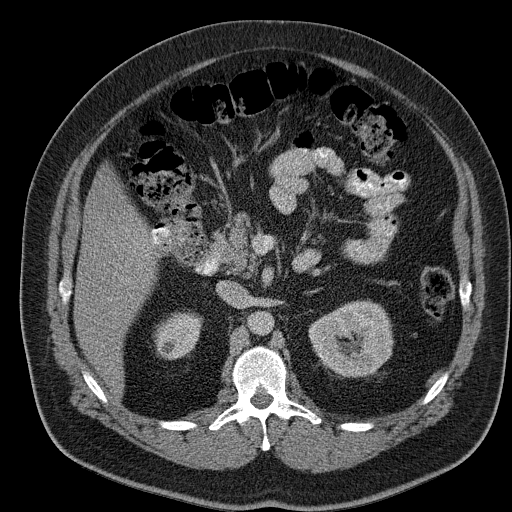
[im 65/101  bone]
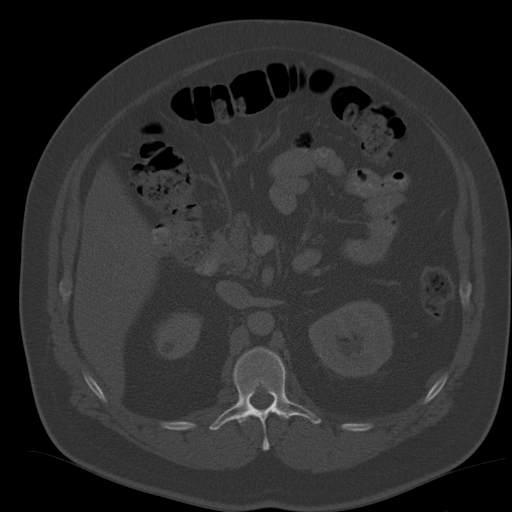
[im 71/101  soft-tissue]
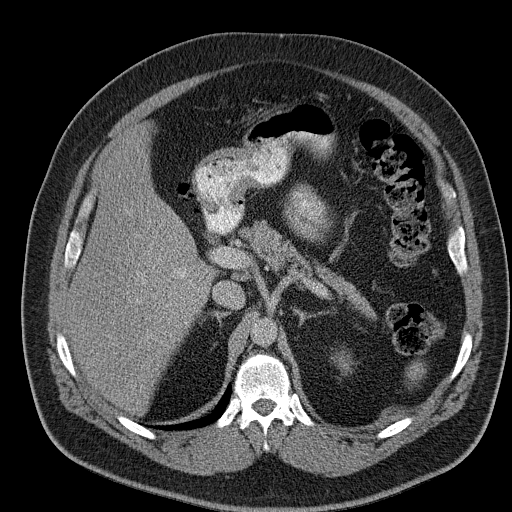
[im 77/101  soft-tissue]
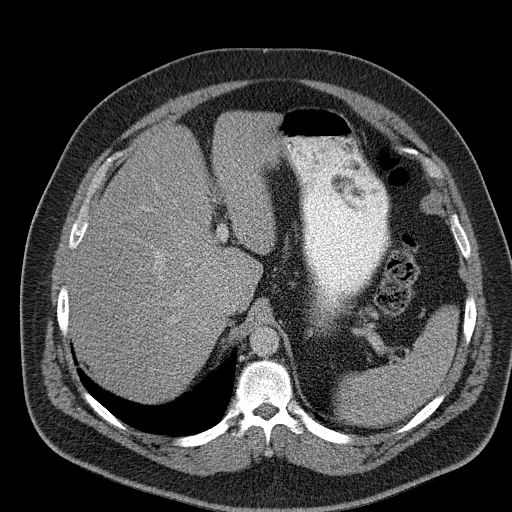
[im 89/101  soft-tissue]
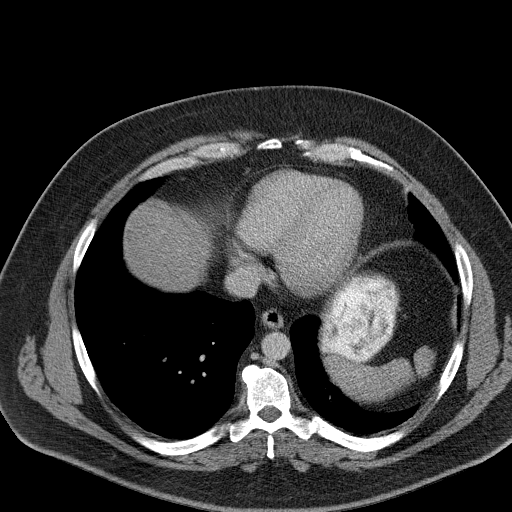
[im 95/101  soft-tissue]
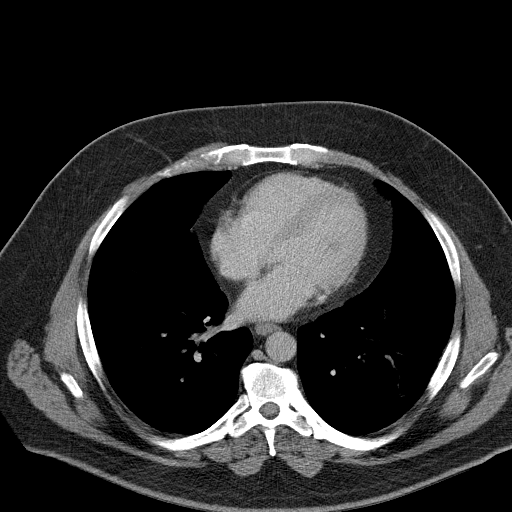

[Series 3: abd_pel_with 3.0 spo cor · coronal · 0.81mm/px · 3 of 139 slices shown]
[im 47/139  soft-tissue]
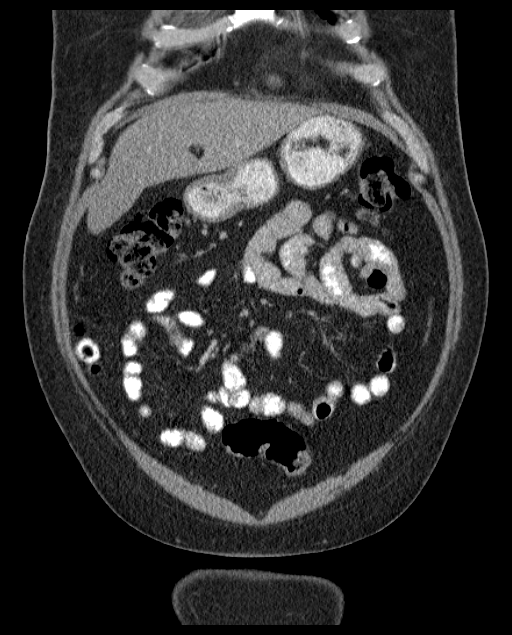
[im 62/139  soft-tissue]
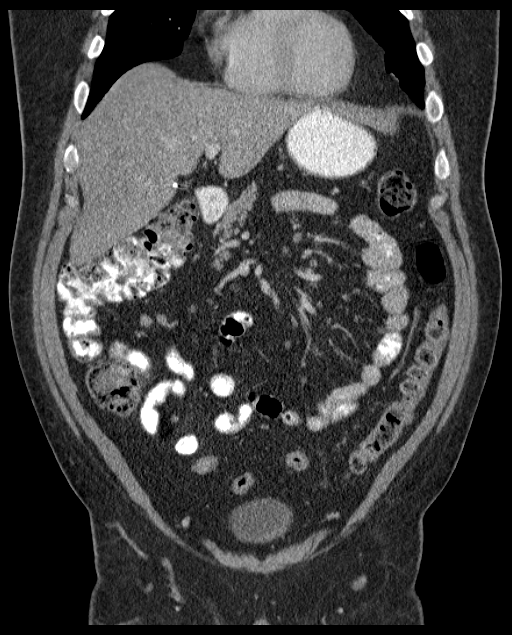
[im 77/139  soft-tissue]
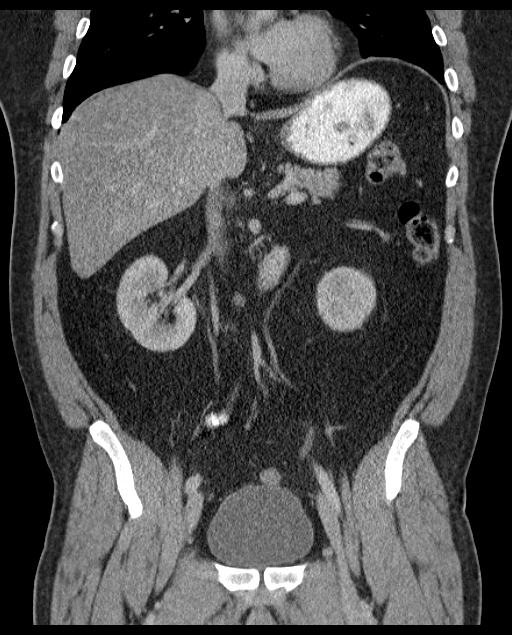

[16 of 46 positions shown; findings below may reference images not displayed]

FINDINGS: Curvilinear left lower lobe scarring reidentified. Lung bases
otherwise clear.

Hepatic hypodensity is compatible with steatosis. Cholecystectomy
clips are noted. Pancreas, adrenal glands, and spleen are normal.
Bilateral too small to characterize renal cortical hypodensities are
noted. There is mild left hydroureteronephrosis to the level of a 5
mm distal left ureteral calculus image 83. Bladder and right ureter
are unremarkable. No perinephric fluid collection.

No free air or fluid. No lymphadenopathy. No bowel wall thickening
or focal segmental dilatation is identified. Bilateral fat
containing inguinal hernias are noted. Mild atheromatous aortic
calcification without aneurysm. No acute osseous abnormality.
IMPRESSION: 5 mm distal left ureteral calculus producing mild proximal left
hydroureteronephrosis.

## 2015-09-04 ENCOUNTER — Ambulatory Visit (INDEPENDENT_AMBULATORY_CARE_PROVIDER_SITE_OTHER): Payer: BLUE CROSS/BLUE SHIELD | Admitting: Nurse Practitioner

## 2015-09-04 ENCOUNTER — Encounter: Payer: Self-pay | Admitting: Nurse Practitioner

## 2015-09-04 VITALS — BP 115/77 | HR 82 | Temp 97.1°F | Ht 72.0 in | Wt 275.0 lb

## 2015-09-04 DIAGNOSIS — I10 Essential (primary) hypertension: Secondary | ICD-10-CM

## 2015-09-04 DIAGNOSIS — E785 Hyperlipidemia, unspecified: Secondary | ICD-10-CM

## 2015-09-04 DIAGNOSIS — E119 Type 2 diabetes mellitus without complications: Secondary | ICD-10-CM

## 2015-09-04 DIAGNOSIS — E039 Hypothyroidism, unspecified: Secondary | ICD-10-CM

## 2015-09-04 LAB — CMP14+EGFR
A/G RATIO: 1.7 (ref 1.2–2.2)
ALT: 51 IU/L — AB (ref 0–44)
AST: 26 IU/L (ref 0–40)
Albumin: 4.6 g/dL (ref 3.5–5.5)
Alkaline Phosphatase: 89 IU/L (ref 39–117)
BILIRUBIN TOTAL: 0.7 mg/dL (ref 0.0–1.2)
BUN/Creatinine Ratio: 16 (ref 9–20)
BUN: 18 mg/dL (ref 6–24)
CALCIUM: 10.1 mg/dL (ref 8.7–10.2)
CHLORIDE: 97 mmol/L (ref 96–106)
CO2: 25 mmol/L (ref 18–29)
Creatinine, Ser: 1.16 mg/dL (ref 0.76–1.27)
GFR, EST AFRICAN AMERICAN: 87 mL/min/{1.73_m2} (ref >59)
GFR, EST NON AFRICAN AMERICAN: 75 mL/min/{1.73_m2} (ref >59)
GLUCOSE: 90 mg/dL (ref 65–99)
Globulin, Total: 2.7 g/dL (ref 1.5–4.5)
POTASSIUM: 4.6 mmol/L (ref 3.5–5.2)
Sodium: 140 mmol/L (ref 134–144)
TOTAL PROTEIN: 7.3 g/dL (ref 6.0–8.5)

## 2015-09-04 LAB — LIPID PANEL
Chol/HDL Ratio: 3.8 ratio units (ref 0.0–5.0)
Cholesterol, Total: 127 mg/dL (ref 100–199)
HDL: 33 mg/dL — AB (ref >39)
LDL Calculated: 60 mg/dL (ref 0–99)
TRIGLYCERIDES: 170 mg/dL — AB (ref 0–149)
VLDL CHOLESTEROL CAL: 34 mg/dL (ref 5–40)

## 2015-09-04 LAB — BAYER DCA HB A1C WAIVED: HB A1C: 6.4 % (ref <7.0)

## 2015-09-04 MED ORDER — LEVOTHYROXINE SODIUM 125 MCG PO TABS: 125.0000 ug | ORAL_TABLET | Freq: Every day | ORAL | Status: DC

## 2015-09-04 MED ORDER — ATORVASTATIN CALCIUM 40 MG PO TABS: ORAL_TABLET | ORAL | Status: DC

## 2015-09-04 MED ORDER — LISINOPRIL-HYDROCHLOROTHIAZIDE 10-12.5 MG PO TABS: 1.0000 | ORAL_TABLET | Freq: Every day | ORAL | Status: DC

## 2015-09-04 NOTE — Progress Notes (Signed)
Subjective:    Patient ID: Wrigley Plasencia, male    DOB: 1971/04/30, 45 y.o.   MRN: [id number]  Patient here today for follow up of chronic medical problems.  Outpatient Encounter Prescriptions as of 09/04/2015  Medication Sig  . atorvastatin (LIPITOR) 40 MG tablet TAKE 1 TABLET (40 MG TOTAL) BY MOUTH DAILY.  Marland Kitchen glimepiride (AMARYL) 2 MG tablet TAKE 1 TABLET (2 MG TOTAL) BY MOUTH DAILY BEFORE BREAKFAST.  Marland Kitchen glucose blood (ONETOUCH VERIO) test strip Check blood sugar every morning fasting and prn  Dx 250.02  . indomethacin (INDOCIN) 50 MG capsule Take 1 capsule (50 mg total) by mouth 3 (three) times daily with meals as needed.  Marland Kitchen levothyroxine (SYNTHROID, LEVOTHROID) 125 MCG tablet Take 1 tablet (125 mcg total) by mouth daily before breakfast.  . lisinopril-hydrochlorothiazide (PRINZIDE,ZESTORETIC) 10-12.5 MG per tablet Take 1 tablet by mouth daily.  . metFORMIN (GLUCOPHAGE) 1000 MG tablet Take 1 tablet (1,000 mg total) by mouth 2 (two) times daily with a meal.   No facility-administered encounter medications on file as of 09/04/2015.      Diabetes He presents for his follow-up diabetic visit. He has type 2 diabetes mellitus. No MedicAlert identification noted. His disease course has been stable. Pertinent negatives for hypoglycemia include no headaches. Pertinent negatives for diabetes include no chest pain, no polydipsia, no polyphagia and no polyuria. There are no hypoglycemic complications. Symptoms are stable. There are no diabetic complications. Risk factors for coronary artery disease include diabetes mellitus, dyslipidemia, hypertension, male sex and obesity. Current diabetic treatment includes oral agent (dual therapy). He is compliant with treatment most of the time. His weight is stable. When asked about meal planning, he reported none. He has not had a previous visit with a dietitian. He rarely participates in exercise. There is no change in his home blood glucose trend. His breakfast  blood glucose is taken between 9-10 am. His breakfast blood glucose range is generally 140-180 mg/dl. His highest blood glucose is >200 mg/dl. His overall blood glucose range is 140-180 mg/dl. (Does not check blood sugars daily) An ACE inhibitor/angiotensin II receptor blocker is being taken. He does not see a podiatrist.Eye exam is not current.  Hypertension This is a chronic problem. The current episode started more than 1 year ago. The problem is unchanged. The problem is controlled. Pertinent negatives include no chest pain, headaches, neck pain, palpitations or shortness of breath. Risk factors for coronary artery disease include diabetes mellitus, dyslipidemia, male gender and obesity. Past treatments include ACE inhibitors and diuretics. The current treatment provides moderate improvement. Compliance problems include diet and exercise.  Hypertensive end-organ damage includes a thyroid problem.  Hyperlipidemia This is a chronic problem. The current episode started more than 1 year ago. The problem is controlled. Recent lipid tests were reviewed and are normal. Exacerbating diseases include diabetes and obesity. He has no history of hypothyroidism. Pertinent negatives include no chest pain or shortness of breath. Current antihyperlipidemic treatment includes statins. The current treatment provides mild improvement of lipids. Compliance problems include adherence to diet and adherence to exercise.  Risk factors for coronary artery disease include diabetes mellitus, dyslipidemia, hypertension, male sex and obesity.  Thyroid Problem Visit type: hypothyroidism. Patient reports no palpitations. The symptoms have been stable. His past medical history is significant for diabetes and hyperlipidemia.      Review of Systems  Constitutional: Negative.   Respiratory: Negative for shortness of breath.   Cardiovascular: Negative for chest pain and palpitations.  Endocrine: Negative  for polydipsia, polyphagia  and polyuria.  Musculoskeletal: Negative for neck pain.  Neurological: Negative for headaches.  All other systems reviewed and are negative.      Objective:   Physical Exam  Constitutional: He is oriented to person, place, and time. He appears well-developed and well-nourished.  HENT:  Head: Normocephalic.  Right Ear: External ear normal.  Left Ear: External ear normal.  Nose: Nose normal.  Mouth/Throat: Oropharynx is clear and moist.  Eyes: EOM are normal. Pupils are equal, round, and reactive to light.  Neck: Normal range of motion. Neck supple. No JVD present. No thyromegaly present.  Cardiovascular: Normal rate, regular rhythm, normal heart sounds and intact distal pulses.  Exam reveals no gallop and no friction rub.   No murmur heard. Pulmonary/Chest: Effort normal and breath sounds normal. No respiratory distress. He has no wheezes. He has no rales. He exhibits no tenderness.  Abdominal: Soft. Bowel sounds are normal. He exhibits no mass. There is no tenderness.  Musculoskeletal: Normal range of motion. He exhibits no edema.  Negative phalen Negative tinel Grips equal bil  Lymphadenopathy:    He has no cervical adenopathy.  Neurological: He is alert and oriented to person, place, and time. No cranial nerve deficit.  Skin: Skin is warm and dry.  Psychiatric: He has a normal mood and affect. His behavior is normal. Judgment and thought content normal.   BP 115/77 mmHg  Pulse 82  Temp(Src) 97.1 F (36.2 C) (Oral)  Ht 6' (1.829 m)  Wt 275 lb (124.739 kg)  BMI 37.29 kg/m2   Hgba1c- 6.4%      Assessment & Plan:  1. Essential hypertension Do not add salt to diet - CMP14+EGFR - lisinopril-hydrochlorothiazide (PRINZIDE,ZESTORETIC) 10-12.5 MG tablet; Take 1 tablet by mouth daily.  Dispense: 90 tablet; Refill: 1  2. Type 2 diabetes mellitus without complication, without long-term current use of insulin (HCC) Continue  To count carbs - Bayer DCA Hb A1c Waived  3.  Hyperlipidemia with target LDL less than 100 Low fat diet - Lipid panel - atorvastatin (LIPITOR) 40 MG tablet; TAKE 1 TABLET (40 MG TOTAL) BY MOUTH DAILY.  Dispense: 90 tablet; Refill: 1  4. Morbid obesity, unspecified obesity type ([place]) Discussed diet and exercise for person with BMI >25 Will recheck weight in 3-6 months  5. Hypothyroidism, unspecified hypothyroidism type - levothyroxine (SYNTHROID, LEVOTHROID) 125 MCG tablet; Take 1 tablet (125 mcg total) by mouth daily before breakfast.  Dispense: 90 tablet; Refill: 1    Labs pending Health maintenance reviewed Diet and exercise encouraged Continue all meds Follow up  In 3 month   [place], FNP

## 2015-09-04 NOTE — Patient Instructions (Signed)

## 2015-12-16 ENCOUNTER — Encounter: Payer: Self-pay | Admitting: Nurse Practitioner

## 2015-12-16 ENCOUNTER — Ambulatory Visit (INDEPENDENT_AMBULATORY_CARE_PROVIDER_SITE_OTHER): Payer: BLUE CROSS/BLUE SHIELD | Admitting: Nurse Practitioner

## 2015-12-16 VITALS — BP 110/73 | HR 94 | Temp 97.7°F | Ht 72.0 in | Wt 276.0 lb

## 2015-12-16 DIAGNOSIS — E785 Hyperlipidemia, unspecified: Secondary | ICD-10-CM

## 2015-12-16 DIAGNOSIS — E039 Hypothyroidism, unspecified: Secondary | ICD-10-CM

## 2015-12-16 DIAGNOSIS — E119 Type 2 diabetes mellitus without complications: Secondary | ICD-10-CM | POA: Diagnosis not present

## 2015-12-16 DIAGNOSIS — I10 Essential (primary) hypertension: Secondary | ICD-10-CM | POA: Diagnosis not present

## 2015-12-16 LAB — BAYER DCA HB A1C WAIVED: HB A1C: 6.8 % (ref <7.0)

## 2015-12-16 MED ORDER — GLIMEPIRIDE 2 MG PO TABS: ORAL_TABLET | ORAL | Status: DC

## 2015-12-16 MED ORDER — LISINOPRIL-HYDROCHLOROTHIAZIDE 10-12.5 MG PO TABS: 1.0000 | ORAL_TABLET | Freq: Every day | ORAL | Status: DC

## 2015-12-16 MED ORDER — METFORMIN HCL 1000 MG PO TABS: 1000.0000 mg | ORAL_TABLET | Freq: Two times a day (BID) | ORAL | Status: DC

## 2015-12-16 MED ORDER — LEVOTHYROXINE SODIUM 125 MCG PO TABS: 125.0000 ug | ORAL_TABLET | Freq: Every day | ORAL | Status: DC

## 2015-12-16 MED ORDER — ATORVASTATIN CALCIUM 40 MG PO TABS: ORAL_TABLET | ORAL | Status: DC

## 2015-12-16 NOTE — Patient Instructions (Signed)
Diabetes and Foot Care Diabetes may cause you to have problems because of poor blood supply (circulation) to your feet and legs. This may cause the skin on your feet to become thinner, break easier, and heal more slowly. Your skin may become dry, and the skin may peel and crack. You may also have nerve damage in your legs and feet causing decreased feeling in them. You may not notice minor injuries to your feet that could lead to infections or more serious problems. Taking care of your feet is one of the most important things you can do for yourself.  HOME CARE INSTRUCTIONS  Wear shoes at all times, even in the house. Do not go barefoot. Bare feet are easily injured.  Check your feet daily for blisters, cuts, and redness. If you cannot see the bottom of your feet, use a mirror or ask someone for help.  Wash your feet with warm water (do not use hot water) and mild soap. Then pat your feet and the areas between your toes until they are completely dry. Do not soak your feet as this can dry your skin.  Apply a moisturizing lotion or petroleum jelly (that does not contain alcohol and is unscented) to the skin on your feet and to dry, brittle toenails. Do not apply lotion between your toes.  Trim your toenails straight across. Do not dig under them or around the cuticle. File the edges of your nails with an emery board or nail file.  Do not cut corns or calluses or try to remove them with medicine.  Wear clean socks or stockings every day. Make sure they are not too tight. Do not wear knee-high stockings since they may decrease blood flow to your legs.  Wear shoes that fit properly and have enough cushioning. To break in new shoes, wear them for just a few hours a day. This prevents you from injuring your feet. Always look in your shoes before you put them on to be sure there are no objects inside.  Do not cross your legs. This may decrease the blood flow to your feet.  If you find a minor scrape,  cut, or break in the skin on your feet, keep it and the skin around it clean and dry. These areas may be cleansed with mild soap and water. Do not cleanse the area with peroxide, alcohol, or iodine.  When you remove an adhesive bandage, be sure not to damage the skin around it.  If you have a wound, look at it several times a day to make sure it is healing.  Do not use heating pads or hot water bottles. They may burn your skin. If you have lost feeling in your feet or legs, you may not know it is happening until it is too late.  Make sure your health care provider performs a complete foot exam at least annually or more often if you have foot problems. Report any cuts, sores, or bruises to your health care provider immediately. SEEK MEDICAL CARE IF:   You have an injury that is not healing.  You have cuts or breaks in the skin.  You have an ingrown nail.  You notice redness on your legs or feet.  You feel burning or tingling in your legs or feet.  You have pain or cramps in your legs and feet.  Your legs or feet are numb.  Your feet always feel cold. SEEK IMMEDIATE MEDICAL CARE IF:   There is increasing redness,   swelling, or pain in or around a wound.  There is a red line that goes up your leg.  Pus is coming from a wound.  You develop a fever or as directed by your health care provider.  You notice a bad smell coming from an ulcer or wound.   This information is not intended to replace advice given to you by your health care provider. Make sure you discuss any questions you have with your health care provider.   Document Released: 05/22/2000 Document Revised: 01/25/2013 Document Reviewed: 11/01/2012 [employer] Interactive Patient Education 2016 [employer]  

## 2015-12-16 NOTE — Progress Notes (Signed)
Subjective:    Patient ID: Tyler Duran, male    DOB: 1971-04-11, 45 y.o.   MRN: [id number]  Patient here today for follow up of chronic medical problems.  Outpatient Encounter Prescriptions as of 12/16/2015  Medication Sig  . atorvastatin (LIPITOR) 40 MG tablet TAKE 1 TABLET (40 MG TOTAL) BY MOUTH DAILY.  Marland Kitchen glimepiride (AMARYL) 2 MG tablet TAKE 1 TABLET (2 MG TOTAL) BY MOUTH DAILY BEFORE BREAKFAST.  Marland Kitchen glucose blood (ONETOUCH VERIO) test strip Check blood sugar every morning fasting and prn  Dx 250.02  . indomethacin (INDOCIN) 50 MG capsule Take 1 capsule (50 mg total) by mouth 3 (three) times daily with meals as needed.  Marland Kitchen levothyroxine (SYNTHROID, LEVOTHROID) 125 MCG tablet Take 1 tablet (125 mcg total) by mouth daily before breakfast.  . lisinopril-hydrochlorothiazide (PRINZIDE,ZESTORETIC) 10-12.5 MG tablet Take 1 tablet by mouth daily.  . metFORMIN (GLUCOPHAGE) 1000 MG tablet Take 1 tablet (1,000 mg total) by mouth 2 (two) times daily with a meal.   No facility-administered encounter medications on file as of 12/16/2015.      Diabetes He presents for his follow-up diabetic visit. He has type 2 diabetes mellitus. No MedicAlert identification noted. His disease course has been stable. Pertinent negatives for hypoglycemia include no headaches. Pertinent negatives for diabetes include no chest pain, no polydipsia, no polyphagia and no polyuria. There are no hypoglycemic complications. Symptoms are stable. There are no diabetic complications. Risk factors for coronary artery disease include diabetes mellitus, dyslipidemia, hypertension, male sex and obesity. Current diabetic treatment includes oral agent (dual therapy). He is compliant with treatment most of the time. His weight is stable. When asked about meal planning, he reported none. He has not had a previous visit with a dietitian. He rarely participates in exercise. There is no change in his home blood glucose trend. His breakfast blood  glucose is taken between 9-10 am. His breakfast blood glucose range is generally 140-180 mg/dl. His highest blood glucose is >200 mg/dl. His overall blood glucose range is 140-180 mg/dl. (Does not check blood sugars daily) An ACE inhibitor/angiotensin II receptor blocker is being taken. He does not see a podiatrist.Eye exam is not current.  Hypertension This is a chronic problem. The current episode started more than 1 year ago. The problem is unchanged. The problem is controlled. Pertinent negatives include no chest pain, headaches, neck pain, palpitations or shortness of breath. Risk factors for coronary artery disease include diabetes mellitus, dyslipidemia, male gender and obesity. Past treatments include ACE inhibitors and diuretics. The current treatment provides moderate improvement. Compliance problems include diet and exercise.  Hypertensive end-organ damage includes a thyroid problem.  Hyperlipidemia This is a chronic problem. The current episode started more than 1 year ago. The problem is controlled. Recent lipid tests were reviewed and are normal. Exacerbating diseases include diabetes and obesity. He has no history of hypothyroidism. Pertinent negatives include no chest pain or shortness of breath. Current antihyperlipidemic treatment includes statins. The current treatment provides mild improvement of lipids. Compliance problems include adherence to diet and adherence to exercise.  Risk factors for coronary artery disease include diabetes mellitus, dyslipidemia, hypertension, male sex and obesity.  Thyroid Problem Visit type: hypothyroidism. Patient reports no palpitations. The symptoms have been stable. His past medical history is significant for diabetes and hyperlipidemia.      Review of Systems  Constitutional: Negative.   Respiratory: Negative for shortness of breath.   Cardiovascular: Negative for chest pain and palpitations.  Endocrine: Negative for  polydipsia, polyphagia and  polyuria.  Musculoskeletal: Negative for neck pain.  Neurological: Negative for headaches.  All other systems reviewed and are negative.      Objective:   Physical Exam  Constitutional: He is oriented to person, place, and time. He appears well-developed and well-nourished.  HENT:  Head: Normocephalic.  Right Ear: External ear normal.  Left Ear: External ear normal.  Nose: Nose normal.  Mouth/Throat: Oropharynx is clear and moist.  Eyes: EOM are normal. Pupils are equal, round, and reactive to light.  Neck: Normal range of motion. Neck supple. No JVD present. No thyromegaly present.  Cardiovascular: Normal rate, regular rhythm, normal heart sounds and intact distal pulses.  Exam reveals no gallop and no friction rub.   No murmur heard. Pulmonary/Chest: Effort normal and breath sounds normal. No respiratory distress. He has no wheezes. He has no rales. He exhibits no tenderness.  Abdominal: Soft. Bowel sounds are normal. He exhibits no mass. There is no tenderness.  Musculoskeletal: Normal range of motion. He exhibits no edema.  Negative phalen Negative tinel Grips equal bil  Lymphadenopathy:    He has no cervical adenopathy.  Neurological: He is alert and oriented to person, place, and time. No cranial nerve deficit.  Skin: Skin is warm and dry.  Psychiatric: He has a normal mood and affect. His behavior is normal. Judgment and thought content normal.   BP 110/73 mmHg  Pulse 94  Temp(Src) 97.7 F (36.5 C) (Oral)  Ht 6' (1.829 m)  Wt 276 lb (125.193 kg)  BMI 37.42 kg/m2   Hgba1c- 6.8% up from 6.4% at last visit      Assessment & Plan:  1. Essential hypertension Do not add salt odiet - lisinopril-hydrochlorothiazide (PRINZIDE,ZESTORETIC) 10-12.5 MG tablet; Take 1 tablet by mouth daily.  Dispense: 90 tablet; Refill: 1 - CMP14+EGFR - CMP14+EGFR  2. Type 2 diabetes mellitus without complication, without long-term current use of insulin (HCC) Continue to watch carbs in  diet - metFORMIN (GLUCOPHAGE) 1000 MG tablet; Take 1 tablet (1,000 mg total) by mouth 2 (two) times daily with a meal.  Dispense: 180 tablet; Refill: 3 - glimepiride (AMARYL) 2 MG tablet; TAKE 1 TABLET (2 MG TOTAL) BY MOUTH DAILY BEFORE BREAKFAST.  Dispense: 90 tablet; Refill: 1 - Bayer DCA Hb A1c Waived  3. Hypothyroidism, unspecified hypothyroidism type - levothyroxine (SYNTHROID, LEVOTHROID) 125 MCG tablet; Take 1 tablet (125 mcg total) by mouth daily before breakfast.  Dispense: 90 tablet; Refill: 1 - Thyroid Panel With TSH  4. Hyperlipidemia with target LDL less than 100 Low fat diet - atorvastatin (LIPITOR) 40 MG tablet; TAKE 1 TABLET (40 MG TOTAL) BY MOUTH DAILY.  Dispense: 90 tablet; Refill: 1 - Lipid panel - Lipid panel  5. Morbid obesity, unspecified obesity type ([place]) Discussed diet and exercise for person with BMI >25 Will recheck weight in 3-6 months     Labs pending Health maintenance reviewed Diet and exercise encouraged Continue all meds Follow up  In 3 month   [place], FNP

## 2015-12-17 LAB — THYROID PANEL WITH TSH
FREE THYROXINE INDEX: 1.9 (ref 1.2–4.9)
T3 UPTAKE RATIO: 27 % (ref 24–39)
T4, Total: 7.2 ug/dL (ref 4.5–12.0)
TSH: 4.26 u[IU]/mL (ref 0.450–4.500)

## 2015-12-17 LAB — CMP14+EGFR
ALK PHOS: 92 IU/L (ref 39–117)
ALT: 27 IU/L (ref 0–44)
AST: 16 IU/L (ref 0–40)
Albumin/Globulin Ratio: 1.8 (ref 1.2–2.2)
Albumin: 4.4 g/dL (ref 3.5–5.5)
BILIRUBIN TOTAL: 0.4 mg/dL (ref 0.0–1.2)
BUN/Creatinine Ratio: 16 (ref 9–20)
BUN: 17 mg/dL (ref 6–24)
CHLORIDE: 99 mmol/L (ref 96–106)
CO2: 21 mmol/L (ref 18–29)
CREATININE: 1.08 mg/dL (ref 0.76–1.27)
Calcium: 9.6 mg/dL (ref 8.7–10.2)
GFR calc Af Amer: 95 mL/min/{1.73_m2} (ref >59)
GFR calc non Af Amer: 82 mL/min/{1.73_m2} (ref >59)
GLUCOSE: 101 mg/dL — AB (ref 65–99)
Globulin, Total: 2.4 g/dL (ref 1.5–4.5)
Potassium: 4.5 mmol/L (ref 3.5–5.2)
Sodium: 143 mmol/L (ref 134–144)
Total Protein: 6.8 g/dL (ref 6.0–8.5)

## 2015-12-17 LAB — LIPID PANEL
CHOL/HDL RATIO: 3.5 ratio (ref 0.0–5.0)
CHOLESTEROL TOTAL: 120 mg/dL (ref 100–199)
HDL: 34 mg/dL — AB (ref >39)
LDL CALC: 58 mg/dL (ref 0–99)
TRIGLYCERIDES: 141 mg/dL (ref 0–149)
VLDL Cholesterol Cal: 28 mg/dL (ref 5–40)

## 2015-12-24 NOTE — Progress Notes (Signed)
Labs were drawn when patient was here in office.

## 2016-04-07 ENCOUNTER — Ambulatory Visit: Payer: BLUE CROSS/BLUE SHIELD | Admitting: Nurse Practitioner

## 2016-04-17 ENCOUNTER — Encounter: Payer: Self-pay | Admitting: Nurse Practitioner

## 2016-04-17 ENCOUNTER — Ambulatory Visit (INDEPENDENT_AMBULATORY_CARE_PROVIDER_SITE_OTHER): Payer: BLUE CROSS/BLUE SHIELD | Admitting: Nurse Practitioner

## 2016-04-17 VITALS — BP 128/78 | HR 74 | Temp 98.0°F | Ht 72.0 in | Wt 274.0 lb

## 2016-04-17 DIAGNOSIS — E785 Hyperlipidemia, unspecified: Secondary | ICD-10-CM | POA: Diagnosis not present

## 2016-04-17 DIAGNOSIS — I10 Essential (primary) hypertension: Secondary | ICD-10-CM | POA: Diagnosis not present

## 2016-04-17 DIAGNOSIS — E039 Hypothyroidism, unspecified: Secondary | ICD-10-CM | POA: Diagnosis not present

## 2016-04-17 DIAGNOSIS — E119 Type 2 diabetes mellitus without complications: Secondary | ICD-10-CM

## 2016-04-17 LAB — BAYER DCA HB A1C WAIVED: HB A1C (BAYER DCA - WAIVED): 6.5 % (ref <7.0)

## 2016-04-17 MED ORDER — LISINOPRIL-HYDROCHLOROTHIAZIDE 10-12.5 MG PO TABS: 1.0000 | ORAL_TABLET | Freq: Every day | ORAL | 1 refills | Status: DC

## 2016-04-17 MED ORDER — LEVOTHYROXINE SODIUM 125 MCG PO TABS: 125.0000 ug | ORAL_TABLET | Freq: Every day | ORAL | 1 refills | Status: DC

## 2016-04-17 MED ORDER — GLIMEPIRIDE 2 MG PO TABS: ORAL_TABLET | ORAL | 1 refills | Status: DC

## 2016-04-17 MED ORDER — METFORMIN HCL 1000 MG PO TABS: 1000.0000 mg | ORAL_TABLET | Freq: Two times a day (BID) | ORAL | 3 refills | Status: DC

## 2016-04-17 MED ORDER — ATORVASTATIN CALCIUM 40 MG PO TABS: ORAL_TABLET | ORAL | 1 refills | Status: DC

## 2016-04-17 NOTE — Patient Instructions (Signed)

## 2016-04-17 NOTE — Progress Notes (Signed)
Subjective:    Patient ID: Tyler Duran, male    DOB: 05-10-1971, 45 y.o.   MRN: [id number]  Patient here today for follow up of chronic medical problems.  Outpatient Encounter Prescriptions as of 04/17/2016  Medication Sig  . atorvastatin (LIPITOR) 40 MG tablet TAKE 1 TABLET (40 MG TOTAL) BY MOUTH DAILY.  Marland Kitchen glimepiride (AMARYL) 2 MG tablet TAKE 1 TABLET (2 MG TOTAL) BY MOUTH DAILY BEFORE BREAKFAST.  Marland Kitchen glucose blood (ONETOUCH VERIO) test strip Check blood sugar every morning fasting and prn  Dx 250.02  . indomethacin (INDOCIN) 50 MG capsule Take 1 capsule (50 mg total) by mouth 3 (three) times daily with meals as needed.  Marland Kitchen levothyroxine (SYNTHROID, LEVOTHROID) 125 MCG tablet Take 1 tablet (125 mcg total) by mouth daily before breakfast.  . lisinopril-hydrochlorothiazide (PRINZIDE,ZESTORETIC) 10-12.5 MG tablet Take 1 tablet by mouth daily.  . metFORMIN (GLUCOPHAGE) 1000 MG tablet Take 1 tablet (1,000 mg total) by mouth 2 (two) times daily with a meal.   No facility-administered encounter medications on file as of 04/17/2016.       Diabetes  He presents for his follow-up diabetic visit. He has type 2 diabetes mellitus. No MedicAlert identification noted. His disease course has been stable. Pertinent negatives for hypoglycemia include no headaches. Pertinent negatives for diabetes include no chest pain, no polydipsia, no polyphagia and no polyuria. There are no hypoglycemic complications. Symptoms are stable. There are no diabetic complications. Risk factors for coronary artery disease include diabetes mellitus, dyslipidemia, hypertension, male sex and obesity. Current diabetic treatment includes oral agent (dual therapy). He is compliant with treatment most of the time. His weight is stable. When asked about meal planning, he reported none. He has not had a previous visit with a dietitian. He rarely participates in exercise. There is no change in his home blood glucose trend. His breakfast  blood glucose is taken between 9-10 am. His breakfast blood glucose range is generally 140-180 mg/dl. His highest blood glucose is >200 mg/dl. His overall blood glucose range is 140-180 mg/dl. (Does not check blood sugars daily) An ACE inhibitor/angiotensin II receptor blocker is being taken. He does not see a podiatrist.Eye exam is not current.  Hypertension  This is a chronic problem. The current episode started more than 1 year ago. The problem is unchanged. The problem is controlled. Pertinent negatives include no chest pain, headaches, neck pain, palpitations or shortness of breath. Risk factors for coronary artery disease include diabetes mellitus, dyslipidemia, male gender and obesity. Past treatments include ACE inhibitors and diuretics. The current treatment provides moderate improvement. Compliance problems include diet and exercise.  Hypertensive end-organ damage includes a thyroid problem.  Hyperlipidemia  This is a chronic problem. The current episode started more than 1 year ago. The problem is controlled. Recent lipid tests were reviewed and are normal. Exacerbating diseases include diabetes and obesity. He has no history of hypothyroidism. Pertinent negatives include no chest pain or shortness of breath. Current antihyperlipidemic treatment includes statins. The current treatment provides mild improvement of lipids. Compliance problems include adherence to diet and adherence to exercise.  Risk factors for coronary artery disease include diabetes mellitus, dyslipidemia, hypertension, male sex and obesity.  Thyroid Problem  Visit type: hypothyroidism. Patient reports no palpitations. The symptoms have been stable. His past medical history is significant for diabetes and hyperlipidemia.      Review of Systems  Constitutional: Negative.   Respiratory: Negative for shortness of breath.   Cardiovascular: Negative for chest pain and  palpitations.  Endocrine: Negative for polydipsia,  polyphagia and polyuria.  Musculoskeletal: Negative for neck pain.  Neurological: Negative for headaches.  All other systems reviewed and are negative.      Objective:   Physical Exam  Constitutional: He is oriented to person, place, and time. He appears well-developed and well-nourished.  HENT:  Head: Normocephalic.  Right Ear: External ear normal.  Left Ear: External ear normal.  Nose: Nose normal.  Mouth/Throat: Oropharynx is clear and moist.  Eyes: EOM are normal. Pupils are equal, round, and reactive to light.  Neck: Normal range of motion. Neck supple. No JVD present. No thyromegaly present.  Cardiovascular: Normal rate, regular rhythm, normal heart sounds and intact distal pulses.  Exam reveals no gallop and no friction rub.   No murmur heard. Pulmonary/Chest: Effort normal and breath sounds normal. No respiratory distress. He has no wheezes. He has no rales. He exhibits no tenderness.  Abdominal: Soft. Bowel sounds are normal. He exhibits no mass. There is no tenderness.  Abdomen obese  Musculoskeletal: Normal range of motion. He exhibits no edema.  Lymphadenopathy:    He has no cervical adenopathy.  Neurological: He is alert and oriented to person, place, and time. No cranial nerve deficit.  No tingling and numbness   Skin: Skin is warm and dry.  Feet- no callus, -ve monofilament test  Psychiatric: He has a normal mood and affect. His behavior is normal. Judgment and thought content normal.   BP 128/78   Pulse 74   Temp 98 F (36.7 C) (Oral)   Ht 6' (1.829 m)   Wt 274 lb (124.3 kg)   BMI 37.16 kg/m    Hgba1c- 6.5% down from 6.8% at last visit    Assessment & Plan:  1. Essential hypertension Low salt diet, do not add extra salt to meals - CMP14+EGFR - lisinopril-hydrochlorothiazide (PRINZIDE,ZESTORETIC) 10-12.5 MG tablet; Take 1 tablet by mouth daily.  Dispense: 90 tablet; Refill: 1  2. Type 2 diabetes mellitus without complication, without long-term current  use of insulin (HCC) Low carb diet - Bayer DCA Hb A1c Waived - Microalbumin / creatinine urine ratio - glimepiride (AMARYL) 2 MG tablet; TAKE 1 TABLET (2 MG TOTAL) BY MOUTH DAILY BEFORE BREAKFAST.  Dispense: 90 tablet; Refill: 1 - metFORMIN (GLUCOPHAGE) 1000 MG tablet; Take 1 tablet (1,000 mg total) by mouth 2 (two) times daily with a meal.  Dispense: 180 tablet; Refill: 3  3. Hyperlipidemia with target LDL less than 100 Low fat diet - Lipid panel - atorvastatin (LIPITOR) 40 MG tablet; TAKE 1 TABLET (40 MG TOTAL) BY MOUTH DAILY.  Dispense: 90 tablet; Refill: 1  4. Morbid obesity ([place]) Weight loss and AHA exercise recommendation discussed  5. Hypothyroidism, unspecified type  - levothyroxine (SYNTHROID, LEVOTHROID) 125 MCG tablet; Take 1 tablet (125 mcg total) by mouth daily before breakfast.  Dispense: 90 tablet; Refill: 1     Labs pending Health maintenance reviewed Diet and exercise encouraged Continue all meds Follow up  in 3 months    Jari Favre, RN, BSN, AGNP Student  [place], [place]

## 2016-04-18 LAB — LIPID PANEL
CHOLESTEROL TOTAL: 189 mg/dL (ref 100–199)
Chol/HDL Ratio: 5.6 ratio units — ABNORMAL HIGH (ref 0.0–5.0)
HDL: 34 mg/dL — ABNORMAL LOW (ref >39)
LDL CALC: 106 mg/dL — AB (ref 0–99)
Triglycerides: 245 mg/dL — ABNORMAL HIGH (ref 0–149)
VLDL Cholesterol Cal: 49 mg/dL — ABNORMAL HIGH (ref 5–40)

## 2016-04-18 LAB — CMP14+EGFR
ALT: 30 IU/L (ref 0–44)
AST: 16 IU/L (ref 0–40)
Albumin/Globulin Ratio: 1.4 (ref 1.2–2.2)
Albumin: 4 g/dL (ref 3.5–5.5)
Alkaline Phosphatase: 87 IU/L (ref 39–117)
BUN/Creatinine Ratio: 13 (ref 9–20)
BUN: 14 mg/dL (ref 6–24)
Bilirubin Total: 0.3 mg/dL (ref 0.0–1.2)
CO2: 26 mmol/L (ref 18–29)
Calcium: 9.1 mg/dL (ref 8.7–10.2)
Chloride: 102 mmol/L (ref 96–106)
Creatinine, Ser: 1.09 mg/dL (ref 0.76–1.27)
GFR calc Af Amer: 93 mL/min/1.73
GFR calc non Af Amer: 80 mL/min/1.73
Globulin, Total: 2.8 g/dL (ref 1.5–4.5)
Glucose: 130 mg/dL — ABNORMAL HIGH (ref 65–99)
Potassium: 4.2 mmol/L (ref 3.5–5.2)
Sodium: 142 mmol/L (ref 134–144)
Total Protein: 6.8 g/dL (ref 6.0–8.5)

## 2016-04-18 LAB — MICROALBUMIN / CREATININE URINE RATIO
Creatinine, Urine: 186 mg/dL
MICROALB/CREAT RATIO: 2.8 mg/g{creat} (ref 0.0–30.0)
MICROALBUM., U, RANDOM: 5.2 ug/mL

## 2016-07-31 ENCOUNTER — Ambulatory Visit (INDEPENDENT_AMBULATORY_CARE_PROVIDER_SITE_OTHER): Payer: BLUE CROSS/BLUE SHIELD | Admitting: Nurse Practitioner

## 2016-07-31 ENCOUNTER — Encounter: Payer: Self-pay | Admitting: Nurse Practitioner

## 2016-07-31 VITALS — BP 115/79 | HR 87 | Temp 97.7°F | Ht 72.0 in | Wt 275.0 lb

## 2016-07-31 DIAGNOSIS — E785 Hyperlipidemia, unspecified: Secondary | ICD-10-CM

## 2016-07-31 DIAGNOSIS — I10 Essential (primary) hypertension: Secondary | ICD-10-CM | POA: Diagnosis not present

## 2016-07-31 DIAGNOSIS — E119 Type 2 diabetes mellitus without complications: Secondary | ICD-10-CM

## 2016-07-31 DIAGNOSIS — E039 Hypothyroidism, unspecified: Secondary | ICD-10-CM

## 2016-07-31 LAB — BAYER DCA HB A1C WAIVED: HB A1C (BAYER DCA - WAIVED): 7.1 % — ABNORMAL HIGH (ref <7.0)

## 2016-07-31 NOTE — Progress Notes (Signed)
Subjective:    Patient ID: Tyler Duran, male    DOB: 10-12-1970, 46 y.o.   MRN: [id number]  Patient here today for follow up of chronic medical problems. No changes since last visit. No complaints today.  Outpatient Encounter Prescriptions as of 07/31/2016  Medication Sig  . atorvastatin (LIPITOR) 40 MG tablet TAKE 1 TABLET (40 MG TOTAL) BY MOUTH DAILY.  Marland Kitchen glimepiride (AMARYL) 2 MG tablet TAKE 1 TABLET (2 MG TOTAL) BY MOUTH DAILY BEFORE BREAKFAST.  . indomethacin (INDOCIN) 50 MG capsule Take 1 capsule (50 mg total) by mouth 3 (three) times daily with meals as needed.  Marland Kitchen levothyroxine (SYNTHROID, LEVOTHROID) 125 MCG tablet Take 1 tablet (125 mcg total) by mouth daily before breakfast.  . lisinopril-hydrochlorothiazide (PRINZIDE,ZESTORETIC) 10-12.5 MG tablet Take 1 tablet by mouth daily.  . metFORMIN (GLUCOPHAGE) 1000 MG tablet Take 1 tablet (1,000 mg total) by mouth 2 (two) times daily with a meal.  . glucose blood (ONETOUCH VERIO) test strip Check blood sugar every morning fasting and prn  Dx 250.02   No facility-administered encounter medications on file as of 07/31/2016.       Diabetes  He presents for his follow-up diabetic visit. He has type 2 diabetes mellitus. No MedicAlert identification noted. His disease course has been stable. Pertinent negatives for hypoglycemia include no headaches. Pertinent negatives for diabetes include no chest pain, no polydipsia, no polyphagia and no polyuria. There are no hypoglycemic complications. Symptoms are stable. There are no diabetic complications. Risk factors for coronary artery disease include diabetes mellitus, dyslipidemia, hypertension, male sex and obesity. Current diabetic treatment includes oral agent (dual therapy). He is compliant with treatment most of the time. His weight is stable. When asked about meal planning, he reported none. He has not had a previous visit with a dietitian. He rarely participates in exercise. There is no change  in his home blood glucose trend. His breakfast blood glucose is taken between 9-10 am. His breakfast blood glucose range is generally 180-200 mg/dl. His highest blood glucose is >200 mg/dl. His overall blood glucose range is 180-200 mg/dl. (Does not check blood sugars daily) An ACE inhibitor/angiotensin II receptor blocker is being taken. He does not see a podiatrist.Eye exam is not current.  Hypertension  This is a chronic problem. The current episode started more than 1 year ago. The problem is unchanged. The problem is controlled. Pertinent negatives include no chest pain, headaches, neck pain, palpitations or shortness of breath. Risk factors for coronary artery disease include diabetes mellitus, dyslipidemia, male gender and obesity. Past treatments include ACE inhibitors and diuretics. The current treatment provides moderate improvement. Compliance problems include diet and exercise.  Identifiable causes of hypertension include a thyroid problem.  Hyperlipidemia  This is a chronic problem. The current episode started more than 1 year ago. The problem is controlled. Recent lipid tests were reviewed and are normal. Exacerbating diseases include diabetes and obesity. He has no history of hypothyroidism. Pertinent negatives include no chest pain or shortness of breath. Current antihyperlipidemic treatment includes statins. The current treatment provides mild improvement of lipids. Compliance problems include adherence to diet and adherence to exercise.  Risk factors for coronary artery disease include diabetes mellitus, dyslipidemia, hypertension, male sex and obesity.  Thyroid Problem  Visit type: hypothyroidism. Patient reports no palpitations. The symptoms have been stable. His past medical history is significant for diabetes and hyperlipidemia.      Review of Systems  Constitutional: Negative.   Respiratory: Negative for shortness of  breath.   Cardiovascular: Negative for chest pain and  palpitations.  Endocrine: Negative for polydipsia, polyphagia and polyuria.  Musculoskeletal: Negative for neck pain.  Neurological: Negative for headaches.  All other systems reviewed and are negative.      Objective:   Physical Exam  Constitutional: He is oriented to person, place, and time. He appears well-developed and well-nourished.  HENT:  Head: Normocephalic.  Right Ear: External ear normal.  Left Ear: External ear normal.  Nose: Nose normal.  Mouth/Throat: Oropharynx is clear and moist.  Eyes: EOM are normal. Pupils are equal, round, and reactive to light.  Neck: Normal range of motion. Neck supple. No JVD present. No thyromegaly present.  Cardiovascular: Normal rate, regular rhythm, normal heart sounds and intact distal pulses.  Exam reveals no gallop and no friction rub.   No murmur heard. Pulmonary/Chest: Effort normal and breath sounds normal. No respiratory distress. He has no wheezes. He has no rales. He exhibits no tenderness.  Abdominal: Soft. Bowel sounds are normal. He exhibits no mass. There is no tenderness.  Abdomen obese  Musculoskeletal: Normal range of motion. He exhibits no edema.  Lymphadenopathy:    He has no cervical adenopathy.  Neurological: He is alert and oriented to person, place, and time. No cranial nerve deficit.  No tingling and numbness   Skin: Skin is warm and dry.  Feet- no callus, -ve monofilament test  Psychiatric: He has a normal mood and affect. His behavior is normal. Judgment and thought content normal.   BP 115/79   Pulse 87   Temp 97.7 F (36.5 C) (Oral)   Ht 6' (1.829 m)   Wt 275 lb (124.7 kg)   BMI 37.30 kg/m    Hgba1c- 7.1% up from 6.5% at last visit    Assessment & Plan:  1. Essential hypertension Low sodium diet - CMP14+EGFR  2. Type 2 diabetes mellitus without complication, without long-term current use of insulin (HCC) Stricter carb counting - Bayer DCA Hb A1c Waived  3. Hypothyroidism, unspecified  type  4. Hyperlipidemia with target LDL less than 100 Low fat diet - Lipid panel  5. Morbid obesity ([place]) Discussed diet and exercise for person with BMI >25 Will recheck weight in 3-6 months   Encouraged to get eye exam Will think about pneumonia vaccine and will let me know at next visit Labs pending Health maintenance reviewed Diet and exercise encouraged Continue all meds Follow up  In 3 months  [place], FNP

## 2016-07-31 NOTE — Patient Instructions (Signed)
Carbohydrate Counting for Diabetes Mellitus, Adult Carbohydrate counting is a method for keeping track of how many carbohydrates you eat. Eating carbohydrates naturally increases the amount of sugar (glucose) in the blood. Counting how many carbohydrates you eat helps keep your blood glucose within normal limits, which helps you manage your diabetes (diabetes mellitus). It is important to know how many carbohydrates you can safely have in each meal. This is different for every person. A diet and nutrition specialist (registered dietitian) can help you make a meal plan and calculate how many carbohydrates you should have at each meal and snack. Carbohydrates are found in the following foods:  Grains, such as breads and cereals.  Dried beans and soy products.  Starchy vegetables, such as potatoes, peas, and corn.  Fruit and fruit juices.  Milk and yogurt.  Sweets and snack foods, such as cake, cookies, candy, chips, and soft drinks. How do I count carbohydrates? There are two ways to count carbohydrates in food. You can use either of the methods or a combination of both. Reading "Nutrition Facts" on packaged food  The "Nutrition Facts" list is included on the labels of almost all packaged foods and beverages in the [place] It includes:  The serving size.  Information about nutrients in each serving, including the grams (g) of carbohydrate per serving. To use the "Nutrition Facts":  Decide how many servings you will have.  Multiply the number of servings by the number of carbohydrates per serving.  The resulting number is the total amount of carbohydrates that you will be having. Learning standard serving sizes of other foods  When you eat foods containing carbohydrates that are not packaged or do not include "Nutrition Facts" on the label, you need to measure the servings in order to count the amount of carbohydrates:  Measure the foods that you will eat with a food scale or measuring  cup, if needed.  Decide how many standard-size servings you will eat.  Multiply the number of servings by 15. Most carbohydrate-rich foods have about 15 g of carbohydrates per serving.  For example, if you eat 8 oz (170 g) of strawberries, you will have eaten 2 servings and 30 g of carbohydrates (2 servings x 15 g = 30 g).  For foods that have more than one food mixed, such as soups and casseroles, you must count the carbohydrates in each food that is included. The following list contains standard serving sizes of common carbohydrate-rich foods. Each of these servings has about 15 g of carbohydrates:   hamburger bun or  English muffin.   oz (15 mL) syrup.   oz (14 g) jelly.  1 slice of bread.  1 six-inch tortilla.  3 oz (85 g) cooked rice or pasta.  4 oz (113 g) cooked dried beans.  4 oz (113 g) starchy vegetable, such as peas, corn, or potatoes.  4 oz (113 g) hot cereal.  4 oz (113 g) mashed potatoes or  of a large baked potato.  4 oz (113 g) canned or frozen fruit.  4 oz (120 mL) fruit juice.  4-6 crackers.  6 chicken nuggets.  6 oz (170 g) unsweetened dry cereal.  6 oz (170 g) plain fat-free yogurt or yogurt sweetened with artificial sweeteners.  8 oz (240 mL) milk.  8 oz (170 g) fresh fruit or one small piece of fruit.  24 oz (680 g) popped popcorn. Example of carbohydrate counting Sample meal  3 oz (85 g) chicken breast.  6 oz (  170 g) brown rice.  4 oz (113 g) corn.  8 oz (240 mL) milk.  8 oz (170 g) strawberries with sugar-free whipped topping. Carbohydrate calculation 1. Identify the foods that contain carbohydrates:  Rice.  Corn.  Milk.  Strawberries. 2. Calculate how many servings you have of each food:  2 servings rice.  1 serving corn.  1 serving milk.  1 serving strawberries. 3. Multiply each number of servings by 15 g:  2 servings rice x 15 g = 30 g.  1 serving corn x 15 g = 15 g.  1 serving milk x 15 g = 15  g.  1 serving strawberries x 15 g = 15 g. 4. Add together all of the amounts to find the total grams of carbohydrates eaten:  30 g + 15 g + 15 g + 15 g = 75 g of carbohydrates total. This information is not intended to replace advice given to you by your health care provider. Make sure you discuss any questions you have with your health care provider. Document Released: 05/25/2005 Document Revised: 12/13/2015 Document Reviewed: 11/06/2015 Elsevier Interactive Patient Education  2017 [employer] Inc.  

## 2016-08-01 LAB — CMP14+EGFR
ALT: 31 IU/L (ref 0–44)
AST: 16 IU/L (ref 0–40)
Albumin/Globulin Ratio: 1.7 (ref 1.2–2.2)
Albumin: 4.3 g/dL (ref 3.5–5.5)
Alkaline Phosphatase: 102 IU/L (ref 39–117)
BUN/Creatinine Ratio: 13 (ref 9–20)
BUN: 14 mg/dL (ref 6–24)
Bilirubin Total: 0.3 mg/dL (ref 0.0–1.2)
CO2: 25 mmol/L (ref 18–29)
Calcium: 9.5 mg/dL (ref 8.7–10.2)
Chloride: 100 mmol/L (ref 96–106)
Creatinine, Ser: 1.05 mg/dL (ref 0.76–1.27)
GFR calc Af Amer: 97 mL/min/{1.73_m2} (ref >59)
GFR calc non Af Amer: 84 mL/min/{1.73_m2} (ref >59)
Globulin, Total: 2.5 g/dL (ref 1.5–4.5)
Glucose: 86 mg/dL (ref 65–99)
Potassium: 4.6 mmol/L (ref 3.5–5.2)
Sodium: 141 mmol/L (ref 134–144)
Total Protein: 6.8 g/dL (ref 6.0–8.5)

## 2016-08-01 LAB — LIPID PANEL
CHOLESTEROL TOTAL: 117 mg/dL (ref 100–199)
Chol/HDL Ratio: 3.8 ratio units (ref 0.0–5.0)
HDL: 31 mg/dL — AB (ref >39)
LDL Calculated: 56 mg/dL (ref 0–99)
TRIGLYCERIDES: 152 mg/dL — AB (ref 0–149)
VLDL CHOLESTEROL CAL: 30 mg/dL (ref 5–40)

## 2016-09-19 ENCOUNTER — Other Ambulatory Visit: Payer: Self-pay | Admitting: Nurse Practitioner

## 2016-09-19 DIAGNOSIS — E119 Type 2 diabetes mellitus without complications: Secondary | ICD-10-CM

## 2016-09-19 DIAGNOSIS — E039 Hypothyroidism, unspecified: Secondary | ICD-10-CM

## 2016-09-23 IMAGING — CR DG FOOT COMPLETE 3+V*L*
3 series · 3 of 3 positions shown · non-contrast
Comparison: None.

CLINICAL DATA: Pain on top of foot.  No trauma.

EXAM:
LEFT FOOT - COMPLETE 3+ VIEW

[view not recorded (1 of 3)]
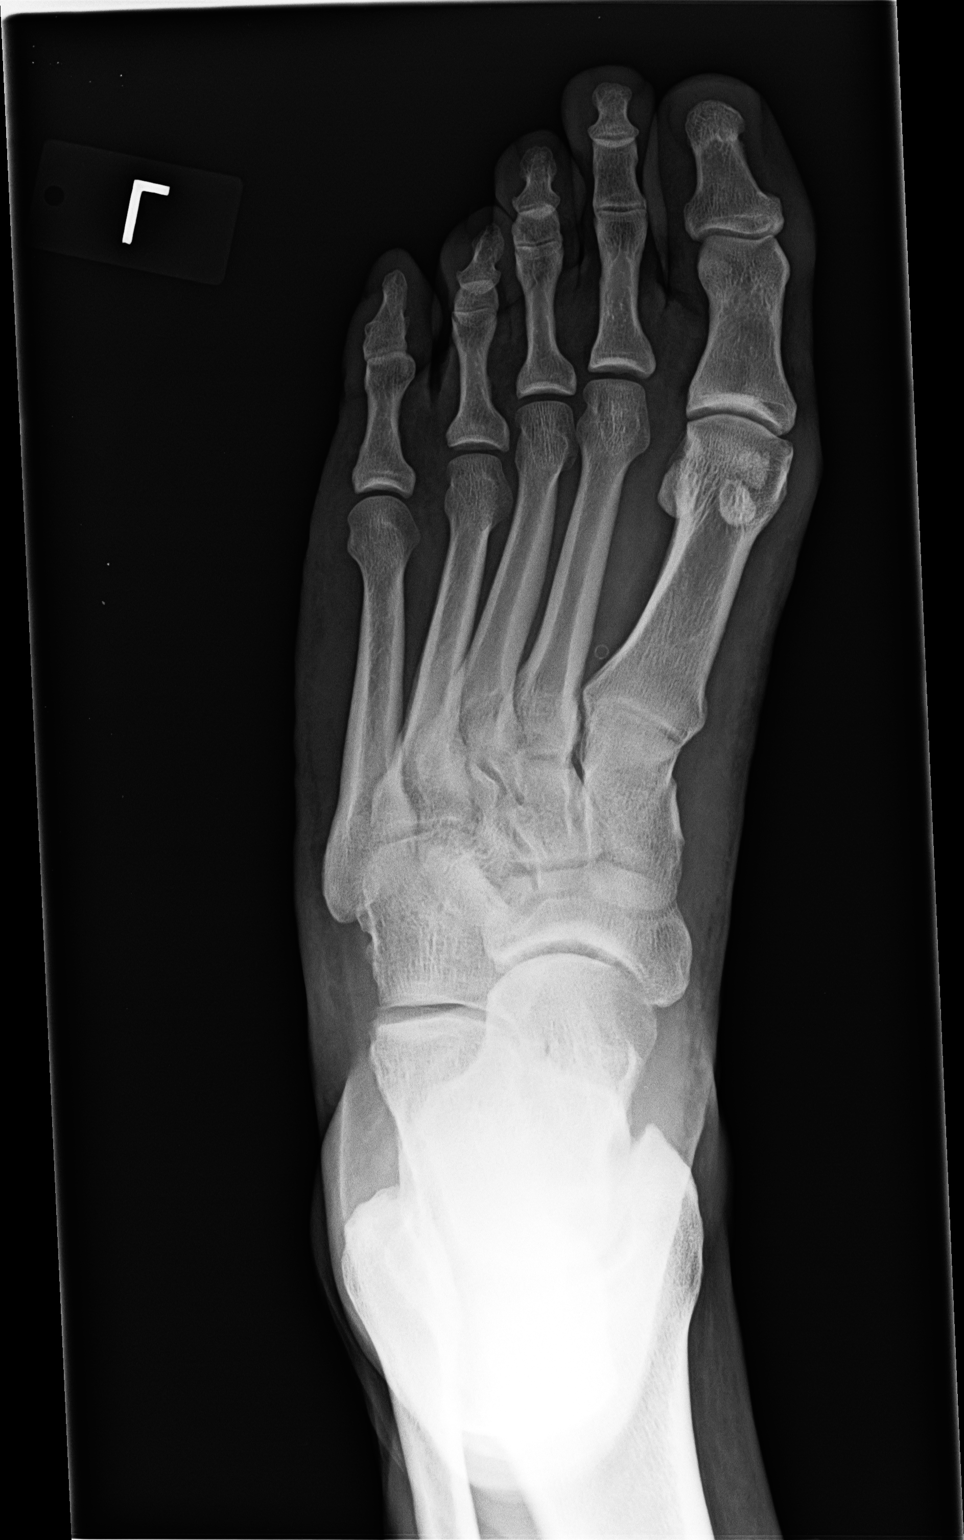

[view not recorded (2 of 3)]
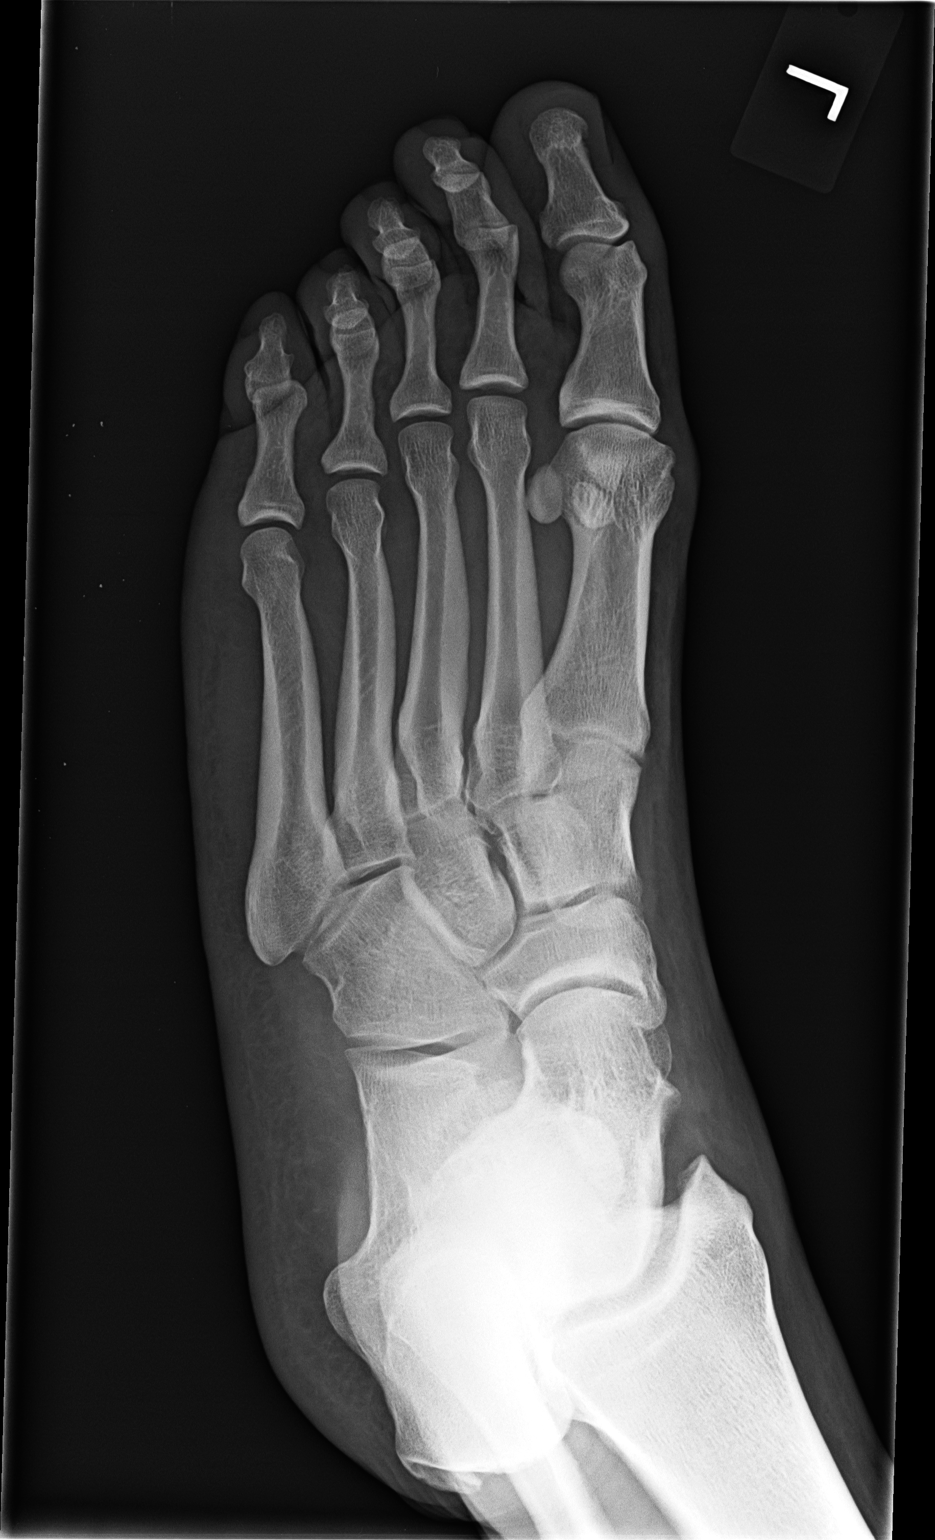

[view not recorded (3 of 3)]
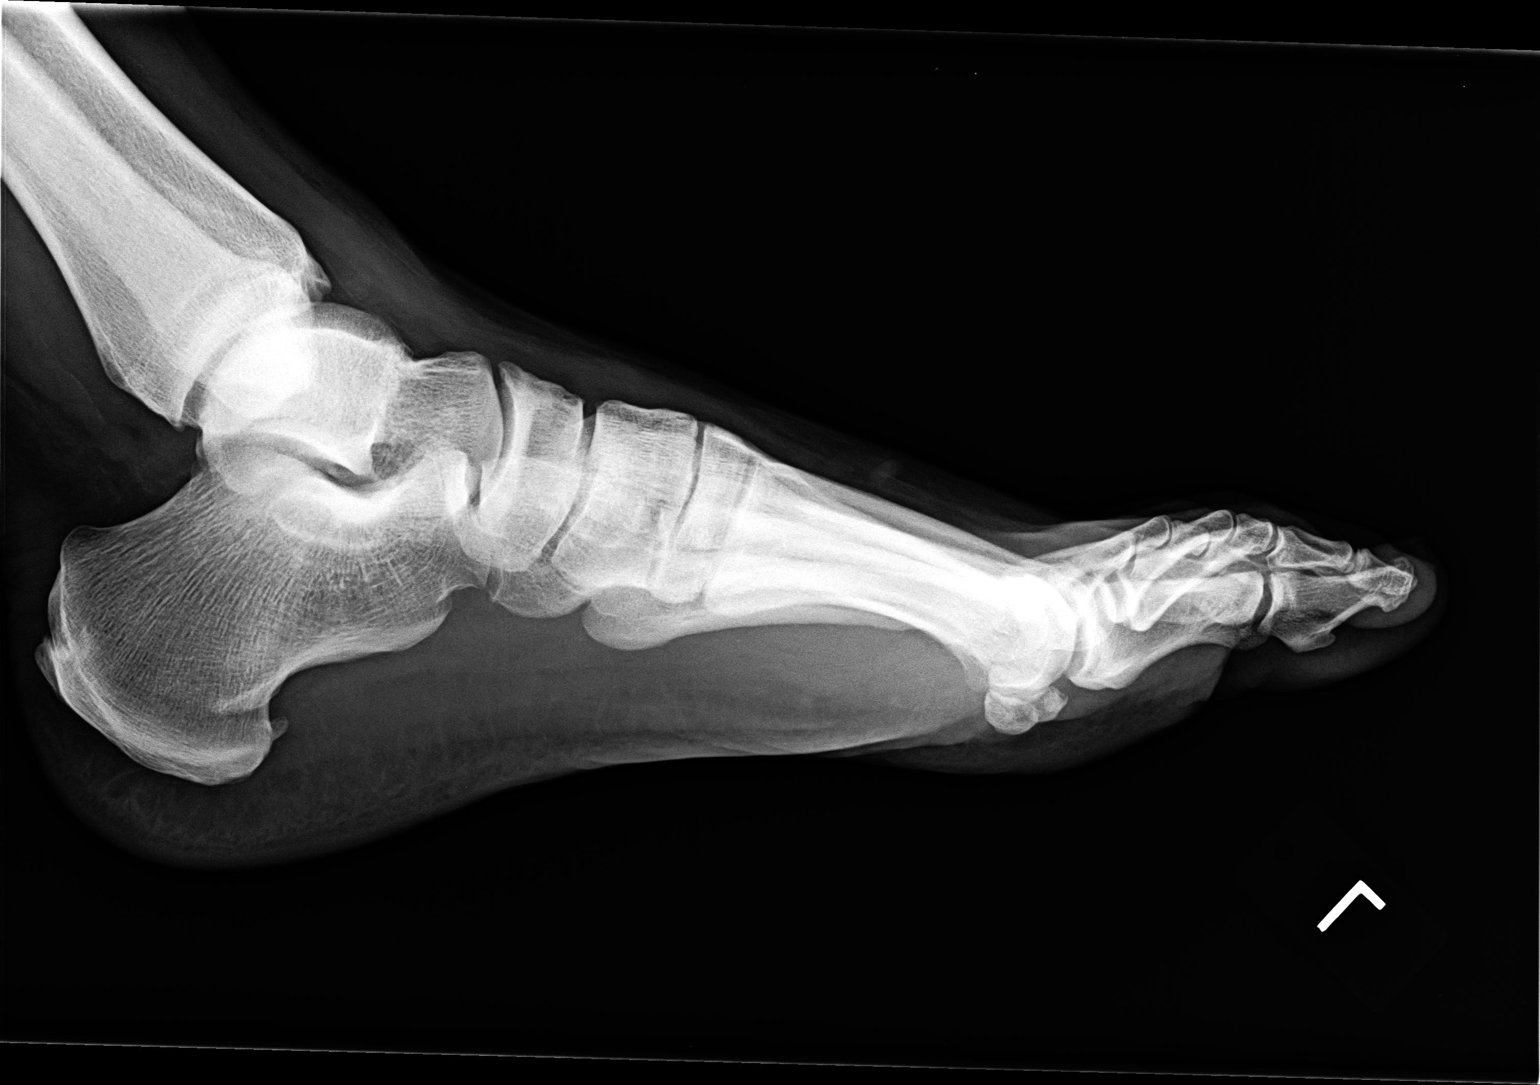

[3 of 3 positions shown; findings below may reference images not displayed]

FINDINGS: Early degenerative changes at the first MTP joint with joint space
narrowing. No acute bony abnormality. Specifically, no fracture,
subluxation, or dislocation. Soft tissues are intact.
IMPRESSION: No acute bony abnormality. Early degenerative changes at the first
MTP joint.

## 2016-10-29 ENCOUNTER — Encounter: Payer: Self-pay | Admitting: Nurse Practitioner

## 2016-10-29 ENCOUNTER — Ambulatory Visit (INDEPENDENT_AMBULATORY_CARE_PROVIDER_SITE_OTHER): Payer: BLUE CROSS/BLUE SHIELD | Admitting: Nurse Practitioner

## 2016-10-29 VITALS — BP 137/83 | HR 67 | Temp 98.0°F | Ht 72.0 in | Wt 277.0 lb

## 2016-10-29 DIAGNOSIS — E785 Hyperlipidemia, unspecified: Secondary | ICD-10-CM

## 2016-10-29 DIAGNOSIS — E119 Type 2 diabetes mellitus without complications: Secondary | ICD-10-CM | POA: Diagnosis not present

## 2016-10-29 DIAGNOSIS — I10 Essential (primary) hypertension: Secondary | ICD-10-CM | POA: Diagnosis not present

## 2016-10-29 DIAGNOSIS — E039 Hypothyroidism, unspecified: Secondary | ICD-10-CM | POA: Diagnosis not present

## 2016-10-29 LAB — BAYER DCA HB A1C WAIVED: HB A1C (BAYER DCA - WAIVED): 7.5 % — ABNORMAL HIGH (ref <7.0)

## 2016-10-29 MED ORDER — LEVOTHYROXINE SODIUM 125 MCG PO TABS: 125.0000 ug | ORAL_TABLET | Freq: Every day | ORAL | 1 refills | Status: DC

## 2016-10-29 MED ORDER — METFORMIN HCL 1000 MG PO TABS: 1000.0000 mg | ORAL_TABLET | Freq: Two times a day (BID) | ORAL | 1 refills | Status: DC

## 2016-10-29 MED ORDER — GLIMEPIRIDE 4 MG PO TABS: 4.0000 mg | ORAL_TABLET | Freq: Every day | ORAL | 1 refills | Status: DC

## 2016-10-29 MED ORDER — ATORVASTATIN CALCIUM 40 MG PO TABS: ORAL_TABLET | ORAL | 1 refills | Status: DC

## 2016-10-29 MED ORDER — LISINOPRIL-HYDROCHLOROTHIAZIDE 10-12.5 MG PO TABS: 1.0000 | ORAL_TABLET | Freq: Every day | ORAL | 1 refills | Status: DC

## 2016-10-29 NOTE — Patient Instructions (Signed)
Carbohydrate Counting for Diabetes Mellitus, Adult Carbohydrate counting is a method for keeping track of how many carbohydrates you eat. Eating carbohydrates naturally increases the amount of sugar (glucose) in the blood. Counting how many carbohydrates you eat helps keep your blood glucose within normal limits, which helps you manage your diabetes (diabetes mellitus). It is important to know how many carbohydrates you can safely have in each meal. This is different for every person. A diet and nutrition specialist (registered dietitian) can help you make a meal plan and calculate how many carbohydrates you should have at each meal and snack. Carbohydrates are found in the following foods:  Grains, such as breads and cereals.  Dried beans and soy products.  Starchy vegetables, such as potatoes, peas, and corn.  Fruit and fruit juices.  Milk and yogurt.  Sweets and snack foods, such as cake, cookies, candy, chips, and soft drinks. How do I count carbohydrates? There are two ways to count carbohydrates in food. You can use either of the methods or a combination of both. Reading "Nutrition Facts" on packaged food  The "Nutrition Facts" list is included on the labels of almost all packaged foods and beverages in the [place] It includes:  The serving size.  Information about nutrients in each serving, including the grams (g) of carbohydrate per serving. To use the "Nutrition Facts":  Decide how many servings you will have.  Multiply the number of servings by the number of carbohydrates per serving.  The resulting number is the total amount of carbohydrates that you will be having. Learning standard serving sizes of other foods  When you eat foods containing carbohydrates that are not packaged or do not include "Nutrition Facts" on the label, you need to measure the servings in order to count the amount of carbohydrates:  Measure the foods that you will eat with a food scale or measuring  cup, if needed.  Decide how many standard-size servings you will eat.  Multiply the number of servings by 15. Most carbohydrate-rich foods have about 15 g of carbohydrates per serving.  For example, if you eat 8 oz (170 g) of strawberries, you will have eaten 2 servings and 30 g of carbohydrates (2 servings x 15 g = 30 g).  For foods that have more than one food mixed, such as soups and casseroles, you must count the carbohydrates in each food that is included. The following list contains standard serving sizes of common carbohydrate-rich foods. Each of these servings has about 15 g of carbohydrates:   hamburger bun or  English muffin.   oz (15 mL) syrup.   oz (14 g) jelly.  1 slice of bread.  1 six-inch tortilla.  3 oz (85 g) cooked rice or pasta.  4 oz (113 g) cooked dried beans.  4 oz (113 g) starchy vegetable, such as peas, corn, or potatoes.  4 oz (113 g) hot cereal.  4 oz (113 g) mashed potatoes or  of a large baked potato.  4 oz (113 g) canned or frozen fruit.  4 oz (120 mL) fruit juice.  4-6 crackers.  6 chicken nuggets.  6 oz (170 g) unsweetened dry cereal.  6 oz (170 g) plain fat-free yogurt or yogurt sweetened with artificial sweeteners.  8 oz (240 mL) milk.  8 oz (170 g) fresh fruit or one small piece of fruit.  24 oz (680 g) popped popcorn. Example of carbohydrate counting Sample meal  3 oz (85 g) chicken breast.  6 oz (  170 g) brown rice.  4 oz (113 g) corn.  8 oz (240 mL) milk.  8 oz (170 g) strawberries with sugar-free whipped topping. Carbohydrate calculation 1. Identify the foods that contain carbohydrates:  Rice.  Corn.  Milk.  Strawberries. 2. Calculate how many servings you have of each food:  2 servings rice.  1 serving corn.  1 serving milk.  1 serving strawberries. 3. Multiply each number of servings by 15 g:  2 servings rice x 15 g = 30 g.  1 serving corn x 15 g = 15 g.  1 serving milk x 15 g = 15  g.  1 serving strawberries x 15 g = 15 g. 4. Add together all of the amounts to find the total grams of carbohydrates eaten:  30 g + 15 g + 15 g + 15 g = 75 g of carbohydrates total. This information is not intended to replace advice given to you by your health care provider. Make sure you discuss any questions you have with your health care provider. Document Released: 05/25/2005 Document Revised: 12/13/2015 Document Reviewed: 11/06/2015 Elsevier Interactive Patient Education  2017 [employer] Inc.  

## 2016-10-29 NOTE — Progress Notes (Signed)
Subjective:    Patient ID: Tyler Duran, male    DOB: April 09, 1971, 46 y.o.   MRN: [id number]  HPI  Tyler Duran is here today for follow up of chronic medical problem.  Outpatient Encounter Prescriptions as of 10/29/2016  Medication Sig  . atorvastatin (LIPITOR) 40 MG tablet TAKE 1 TABLET (40 MG TOTAL) BY MOUTH DAILY.  Marland Kitchen glimepiride (AMARYL) 2 MG tablet TAKE 1 TABLET (2 MG TOTAL) BY MOUTH DAILY BEFORE BREAKFAST.  Marland Kitchen glimepiride (AMARYL) 2 MG tablet TAKE 1 TABLET (2 MG TOTAL) BY MOUTH DAILY BEFORE BREAKFAST.  Marland Kitchen glucose blood (ONETOUCH VERIO) test strip Check blood sugar every morning fasting and prn  Dx 250.02  . indomethacin (INDOCIN) 50 MG capsule Take 1 capsule (50 mg total) by mouth 3 (three) times daily with meals as needed.  Marland Kitchen levothyroxine (SYNTHROID, LEVOTHROID) 125 MCG tablet Take 1 tablet (125 mcg total) by mouth daily before breakfast.      . lisinopril-hydrochlorothiazide (PRINZIDE,ZESTORETIC) 10-12.5 MG tablet Take 1 tablet by mouth daily.  . metFORMIN (GLUCOPHAGE) 1000 MG tablet Take 1 tablet (1,000 mg total) by mouth 2 (two) times daily with a meal.   No facility-administered encounter medications on file as of 10/29/2016.     1. Essential hypertension  No c/o chest pain,SOB or HA- does not check blood pressure at home  2. Type 2 diabetes mellitus without complication, without long-term current use of insulin (HCC)  Last HGBA1c was 7.1%- does not check blood sugar everyday- no hypoglycemia that he is aware of.  3. Hyperlipidemia with target LDL less than 100  Tries to watch diet- not very successful  4. Hypothyroidism, unspecified type   no problems  5. Morbid obesity ([place])  No recent weight gain or weight loss    New complaints: None today     Review of Systems  Constitutional: Negative for diaphoresis.  Eyes: Negative for pain.  Respiratory: Negative for shortness of breath.   Cardiovascular: Negative for chest pain, palpitations and leg swelling.    Gastrointestinal: Negative for abdominal pain.  Endocrine: Negative for polydipsia.  Skin: Negative for rash.  Neurological: Negative for dizziness, weakness and headaches.  Hematological: Does not bruise/bleed easily.       Objective:   Physical Exam  Constitutional: He is oriented to person, place, and time. He appears well-developed and well-nourished.  HENT:  Head: Normocephalic.  Right Ear: External ear normal.  Left Ear: External ear normal.  Nose: Nose normal.  Mouth/Throat: Oropharynx is clear and moist.  Eyes: EOM are normal. Pupils are equal, round, and reactive to light.  Neck: Normal range of motion. Neck supple. No JVD present. No thyromegaly present.  Cardiovascular: Normal rate, regular rhythm, normal heart sounds and intact distal pulses.  Exam reveals no gallop and no friction rub.   No murmur heard. Pulmonary/Chest: Effort normal and breath sounds normal. No respiratory distress. He has no wheezes. He has no rales. He exhibits no tenderness.  Abdominal: Soft. Bowel sounds are normal. He exhibits no mass. There is no tenderness.  Musculoskeletal: Normal range of motion. He exhibits no edema.  Lymphadenopathy:    He has no cervical adenopathy.  Neurological: He is alert and oriented to person, place, and time. No cranial nerve deficit.  Skin: Skin is warm and dry.  Psychiatric: He has a normal mood and affect. His behavior is normal. Judgment and thought content normal.   BP 137/83   Pulse 67   Temp 98 F (36.7 C) (Oral)  Ht 6' (1.829 m)   Wt 277 lb (125.6 kg)   BMI 37.57 kg/m        Assessment & Plan:  1. Essential hypertension Low sodium diet - CMP14+EGFR - lisinopril-hydrochlorothiazide (PRINZIDE,ZESTORETIC) 10-12.5 MG tablet; Take 1 tablet by mouth daily.  Dispense: 90 tablet; Refill: 1  2. Type 2 diabetes mellitus without complication, without long-term current use of insulin (HCC) Stricter carb counting amaryl increased to '4mg'$  daily - Bayer  DCA Hb A1c Waived - metFORMIN (GLUCOPHAGE) 1000 MG tablet; Take 1 tablet (1,000 mg total) by mouth 2 (two) times daily with a meal.  Dispense: 180 tablet; Refill: 1 - glimepiride (AMARYL) 4 MG tablet; Take 1 tablet (4 mg total) by mouth daily with breakfast.  Dispense: 90 tablet; Refill: 1  3. Hyperlipidemia with target LDL less than 100 Low fat diet - Lipid panel - atorvastatin (LIPITOR) 40 MG tablet; TAKE 1 TABLET (40 MG TOTAL) BY MOUTH DAILY.  Dispense: 90 tablet; Refill: 1  4. Hypothyroidism, unspecified type  5. Morbid obesity ([place]) Discussed diet and exercise for person with BMI >25 Will recheck weight in 3-6 months  6. Acquired hypothyroidism - levothyroxine (SYNTHROID, LEVOTHROID) 125 MCG tablet; Take 1 tablet (125 mcg total) by mouth daily before breakfast.  Dispense: 90 tablet; Refill: 1    Labs pending Health maintenance reviewed Diet and exercise encouraged Continue all meds Follow up  In 3 months   [place], FNP

## 2016-10-30 LAB — CMP14+EGFR
A/G RATIO: 1.7 (ref 1.2–2.2)
ALBUMIN: 4.1 g/dL (ref 3.5–5.5)
ALK PHOS: 94 IU/L (ref 39–117)
ALT: 37 IU/L (ref 0–44)
AST: 22 IU/L (ref 0–40)
BUN / CREAT RATIO: 15 (ref 9–20)
BUN: 16 mg/dL (ref 6–24)
Bilirubin Total: 0.4 mg/dL (ref 0.0–1.2)
CALCIUM: 8.9 mg/dL (ref 8.7–10.2)
CO2: 25 mmol/L (ref 18–29)
Chloride: 102 mmol/L (ref 96–106)
Creatinine, Ser: 1.04 mg/dL (ref 0.76–1.27)
GFR calc Af Amer: 98 mL/min/{1.73_m2} (ref >59)
GFR, EST NON AFRICAN AMERICAN: 85 mL/min/{1.73_m2} (ref >59)
GLOBULIN, TOTAL: 2.4 g/dL (ref 1.5–4.5)
Glucose: 169 mg/dL — ABNORMAL HIGH (ref 65–99)
POTASSIUM: 4.7 mmol/L (ref 3.5–5.2)
SODIUM: 141 mmol/L (ref 134–144)
Total Protein: 6.5 g/dL (ref 6.0–8.5)

## 2016-10-30 LAB — LIPID PANEL
CHOL/HDL RATIO: 6 ratio — AB (ref 0.0–5.0)
Cholesterol, Total: 169 mg/dL (ref 100–199)
HDL: 28 mg/dL — ABNORMAL LOW (ref >39)
LDL Calculated: 77 mg/dL (ref 0–99)
TRIGLYCERIDES: 321 mg/dL — AB (ref 0–149)
VLDL Cholesterol Cal: 64 mg/dL — ABNORMAL HIGH (ref 5–40)

## 2017-02-01 ENCOUNTER — Ambulatory Visit (INDEPENDENT_AMBULATORY_CARE_PROVIDER_SITE_OTHER): Payer: BLUE CROSS/BLUE SHIELD | Admitting: Nurse Practitioner

## 2017-02-01 ENCOUNTER — Encounter: Payer: Self-pay | Admitting: Nurse Practitioner

## 2017-02-01 VITALS — BP 113/74 | HR 92 | Temp 97.4°F | Ht 72.0 in | Wt 277.0 lb

## 2017-02-01 DIAGNOSIS — E039 Hypothyroidism, unspecified: Secondary | ICD-10-CM | POA: Diagnosis not present

## 2017-02-01 DIAGNOSIS — E785 Hyperlipidemia, unspecified: Secondary | ICD-10-CM

## 2017-02-01 DIAGNOSIS — I10 Essential (primary) hypertension: Secondary | ICD-10-CM | POA: Diagnosis not present

## 2017-02-01 DIAGNOSIS — E119 Type 2 diabetes mellitus without complications: Secondary | ICD-10-CM | POA: Diagnosis not present

## 2017-02-01 LAB — BAYER DCA HB A1C WAIVED: HB A1C (BAYER DCA - WAIVED): 8.1 % — ABNORMAL HIGH (ref <7.0)

## 2017-02-01 MED ORDER — LEVOTHYROXINE SODIUM 125 MCG PO TABS: 125.0000 ug | ORAL_TABLET | Freq: Every day | ORAL | 1 refills | Status: DC

## 2017-02-01 MED ORDER — GLIMEPIRIDE 4 MG PO TABS: 4.0000 mg | ORAL_TABLET | Freq: Every day | ORAL | 1 refills | Status: DC

## 2017-02-01 MED ORDER — LISINOPRIL-HYDROCHLOROTHIAZIDE 10-12.5 MG PO TABS: 1.0000 | ORAL_TABLET | Freq: Every day | ORAL | 1 refills | Status: DC

## 2017-02-01 MED ORDER — SITAGLIPTIN PHOS-METFORMIN HCL 50-1000 MG PO TABS: 1.0000 | ORAL_TABLET | Freq: Two times a day (BID) | ORAL | 1 refills | Status: DC

## 2017-02-01 NOTE — Progress Notes (Signed)
Subjective:    Patient ID: Tyler Duran, male    DOB: 23-Oct-1970, 46 y.o.   MRN: [id number]  HPI  Tyler Duran is here today for follow up of chronic medical problem.  Outpatient Encounter Prescriptions as of 02/01/2017  Medication Sig  . atorvastatin (LIPITOR) 40 MG tablet TAKE 1 TABLET (40 MG TOTAL) BY MOUTH DAILY.  Marland Kitchen glimepiride (AMARYL) 4 MG tablet Take 1 tablet (4 mg total) by mouth daily with breakfast.  . glucose blood (ONETOUCH VERIO) test strip Check blood sugar every morning fasting and prn  Dx 250.02  . indomethacin (INDOCIN) 50 MG capsule Take 1 capsule (50 mg total) by mouth 3 (three) times daily with meals as needed.  Marland Kitchen levothyroxine (SYNTHROID, LEVOTHROID) 125 MCG tablet Take 1 tablet (125 mcg total) by mouth daily before breakfast.  . lisinopril-hydrochlorothiazide (PRINZIDE,ZESTORETIC) 10-12.5 MG tablet Take 1 tablet by mouth daily.  . sitaGLIPtin-metformin (JANUMET) 50-1000 MG tablet Take 1 tablet by mouth 2 (two) times daily with a meal.   No facility-administered encounter medications on file as of 02/01/2017.     1. Essential hypertension  No c/o chest pain, SOB or headaches- does not check blood pressure at home  2. Type 2 diabetes mellitus without complication, without long-term current use of insulin (HCC) Last hgba1c was 7.5%. His blood sugars have been running 160-210.he denies any hypoglycemia  3. Hyperlipidemia with target LDL less than 100  Does not wtach diet at all  4. Hypothyroidism, unspecified type  No problems that he is aware of  5. Morbid obesity (Tyler Duran)  No recent weight changes  6. Acquired hypothyroidism  As stated above no problems    New complaints: none  Social history: No recent family changes   Review of Systems  Constitutional: Negative for activity change and appetite change.  HENT: Negative.   Eyes: Negative for pain.  Respiratory: Negative for shortness of breath.   Cardiovascular: Negative for chest pain, palpitations and  leg swelling.  Gastrointestinal: Negative for abdominal pain.  Endocrine: Negative for polydipsia.  Genitourinary: Negative.   Skin: Negative for rash.  Neurological: Negative for dizziness, weakness and headaches.  Hematological: Does not bruise/bleed easily.  Psychiatric/Behavioral: Negative.   All other systems reviewed and are negative.      Objective:   Physical Exam  Constitutional: He is oriented to person, place, and time. He appears well-developed and well-nourished.  HENT:  Head: Normocephalic.  Right Ear: External ear normal.  Left Ear: External ear normal.  Nose: Nose normal.  Mouth/Throat: Oropharynx is clear and moist.  Eyes: Pupils are equal, round, and reactive to light. EOM are normal.  Neck: Normal range of motion. Neck supple. No JVD present. No thyromegaly present.  Cardiovascular: Normal rate, regular rhythm, normal heart sounds and intact distal pulses.  Exam reveals no gallop and no friction rub.   No murmur heard. Pulmonary/Chest: Effort normal and breath sounds normal. No respiratory distress. He has no wheezes. He has no rales. He exhibits no tenderness.  Abdominal: Soft. Bowel sounds are normal. He exhibits no mass. There is no tenderness.  Genitourinary: Prostate normal and penis normal.  Musculoskeletal: Normal range of motion. He exhibits no edema.  Lymphadenopathy:    He has no cervical adenopathy.  Neurological: He is alert and oriented to person, place, and time. No cranial nerve deficit.  Skin: Skin is warm and dry.  Psychiatric: He has a normal mood and affect. His behavior is normal. Judgment and thought content normal.   BP  113/74   Pulse 92   Temp (!) 97.4 F (36.3 C) (Oral)   Ht 6' (1.829 m)   Wt 277 lb (125.6 kg)   BMI 37.57 kg/m       Assessment & Plan:  1. Essential hypertension Low sodium diet - CMP14+EGFR - lisinopril-hydrochlorothiazide (PRINZIDE,ZESTORETIC) 10-12.5 MG tablet; Take 1 tablet by mouth daily.  Dispense: 90  tablet; Refill: 1  2. Type 2 diabetes mellitus without complication, without long-term current use of insulin (HCC) Stricter carb counting Changed metformin to janumet- side effects discussed - Bayer DCA Hb A1c Waived - glimepiride (AMARYL) 4 MG tablet; Take 1 tablet (4 mg total) by mouth daily with breakfast.  Dispense: 90 tablet; Refill: 1 - sitaGLIPtin-metformin (JANUMET) 50-1000 MG tablet; Take 1 tablet by mouth 2 (two) times daily with a meal.  Dispense: 180 tablet; Refill: 1  3. Hyperlipidemia with target LDL less than 100 Low fat diet encouraged - Lipid panel  4. Morbid obesity (Tyler Duran) Discussed diet and exercise for person with BMI >25 Will recheck weight in 3-6 months   5. Acquired hypothyroidism - levothyroxine (SYNTHROID, LEVOTHROID) 125 MCG tablet; Take 1 tablet (125 mcg total) by mouth daily before breakfast.  Dispense: 90 tablet; Refill: 1    Labs pending Health maintenance reviewed Diet and exercise encouraged Continue all meds Follow up  In 3 months   [place], FNP

## 2017-02-01 NOTE — Patient Instructions (Signed)

## 2017-02-02 LAB — LIPID PANEL
CHOL/HDL RATIO: 3.7 ratio (ref 0.0–5.0)
CHOLESTEROL TOTAL: 110 mg/dL (ref 100–199)
HDL: 30 mg/dL — AB (ref >39)
LDL CALC: 43 mg/dL (ref 0–99)
TRIGLYCERIDES: 183 mg/dL — AB (ref 0–149)
VLDL CHOLESTEROL CAL: 37 mg/dL (ref 5–40)

## 2017-02-02 LAB — CMP14+EGFR
A/G RATIO: 1.9 (ref 1.2–2.2)
ALT: 32 IU/L (ref 0–44)
AST: 19 IU/L (ref 0–40)
Albumin: 4.3 g/dL (ref 3.5–5.5)
Alkaline Phosphatase: 87 IU/L (ref 39–117)
BILIRUBIN TOTAL: 0.4 mg/dL (ref 0.0–1.2)
BUN/Creatinine Ratio: 14 (ref 9–20)
BUN: 14 mg/dL (ref 6–24)
CALCIUM: 9.2 mg/dL (ref 8.7–10.2)
CO2: 25 mmol/L (ref 20–29)
Chloride: 103 mmol/L (ref 96–106)
Creatinine, Ser: 1.02 mg/dL (ref 0.76–1.27)
GFR calc Af Amer: 101 mL/min/{1.73_m2} (ref >59)
GFR, EST NON AFRICAN AMERICAN: 87 mL/min/{1.73_m2} (ref >59)
GLUCOSE: 120 mg/dL — AB (ref 65–99)
Globulin, Total: 2.3 g/dL (ref 1.5–4.5)
Potassium: 4.6 mmol/L (ref 3.5–5.2)
Sodium: 144 mmol/L (ref 134–144)
Total Protein: 6.6 g/dL (ref 6.0–8.5)

## 2017-04-05 ENCOUNTER — Telehealth: Payer: Self-pay | Admitting: Nurse Practitioner

## 2017-04-05 DIAGNOSIS — E039 Hypothyroidism, unspecified: Secondary | ICD-10-CM

## 2017-04-05 DIAGNOSIS — E785 Hyperlipidemia, unspecified: Secondary | ICD-10-CM

## 2017-04-05 DIAGNOSIS — I10 Essential (primary) hypertension: Secondary | ICD-10-CM

## 2017-04-05 DIAGNOSIS — E119 Type 2 diabetes mellitus without complications: Secondary | ICD-10-CM

## 2017-04-05 MED ORDER — ATORVASTATIN CALCIUM 40 MG PO TABS: ORAL_TABLET | ORAL | 0 refills | Status: DC

## 2017-04-05 MED ORDER — LISINOPRIL-HYDROCHLOROTHIAZIDE 10-12.5 MG PO TABS: 1.0000 | ORAL_TABLET | Freq: Every day | ORAL | 0 refills | Status: DC

## 2017-04-05 MED ORDER — GLIMEPIRIDE 4 MG PO TABS: 4.0000 mg | ORAL_TABLET | Freq: Every day | ORAL | 0 refills | Status: DC

## 2017-04-05 MED ORDER — LEVOTHYROXINE SODIUM 125 MCG PO TABS: 125.0000 ug | ORAL_TABLET | Freq: Every day | ORAL | 0 refills | Status: DC

## 2017-04-05 MED ORDER — SITAGLIPTIN PHOS-METFORMIN HCL 50-1000 MG PO TABS: 1.0000 | ORAL_TABLET | Freq: Two times a day (BID) | ORAL | 0 refills | Status: DC

## 2017-04-05 NOTE — Telephone Encounter (Signed)
Pt's Rxs sent to [facility] Pt changing pharmacy's

## 2017-04-05 NOTE — Telephone Encounter (Signed)
What is the name of the medication?Tyler Duran, Tyler Duran, Tyler Duran,  Tyler Duran, Tyler Duran, pt is out of all  Have you contacted your pharmacy to request a refill?No, he is switching pharmacies to [facility] in [place] no longer going to use [facility] would you like this sent to? [facility] [place]   Patient notified that their request is being sent to the clinical staff for review and that they should receive a call once it is complete. If they do not receive a call within 24 hours they can check with their pharmacy or our office.

## 2017-04-23 DIAGNOSIS — H52223 Regular astigmatism, bilateral: Secondary | ICD-10-CM | POA: Diagnosis not present

## 2017-04-23 DIAGNOSIS — E119 Type 2 diabetes mellitus without complications: Secondary | ICD-10-CM | POA: Diagnosis not present

## 2017-04-23 DIAGNOSIS — H524 Presbyopia: Secondary | ICD-10-CM | POA: Diagnosis not present

## 2017-04-23 DIAGNOSIS — H5213 Myopia, bilateral: Secondary | ICD-10-CM | POA: Diagnosis not present

## 2017-04-23 LAB — HM DIABETES EYE EXAM

## 2017-05-11 ENCOUNTER — Ambulatory Visit: Payer: BLUE CROSS/BLUE SHIELD | Admitting: Nurse Practitioner

## 2017-05-12 ENCOUNTER — Ambulatory Visit: Payer: BLUE CROSS/BLUE SHIELD | Admitting: Nurse Practitioner

## 2017-05-12 ENCOUNTER — Encounter: Payer: Self-pay | Admitting: Nurse Practitioner

## 2017-05-12 VITALS — BP 129/81 | HR 98 | Temp 98.1°F | Ht 73.0 in | Wt 276.8 lb

## 2017-05-12 DIAGNOSIS — E119 Type 2 diabetes mellitus without complications: Secondary | ICD-10-CM | POA: Diagnosis not present

## 2017-05-12 DIAGNOSIS — I10 Essential (primary) hypertension: Secondary | ICD-10-CM

## 2017-05-12 DIAGNOSIS — E785 Hyperlipidemia, unspecified: Secondary | ICD-10-CM

## 2017-05-12 DIAGNOSIS — E039 Hypothyroidism, unspecified: Secondary | ICD-10-CM

## 2017-05-12 LAB — BAYER DCA HB A1C WAIVED: HB A1C: 9.6 % — AB (ref <7.0)

## 2017-05-12 MED ORDER — SITAGLIPTIN PHOS-METFORMIN HCL 50-1000 MG PO TABS: 1.0000 | ORAL_TABLET | Freq: Two times a day (BID) | ORAL | 0 refills | Status: DC

## 2017-05-12 MED ORDER — GLIMEPIRIDE 4 MG PO TABS: 4.0000 mg | ORAL_TABLET | Freq: Every day | ORAL | 0 refills | Status: DC

## 2017-05-12 MED ORDER — LEVOTHYROXINE SODIUM 125 MCG PO TABS: 125.0000 ug | ORAL_TABLET | Freq: Every day | ORAL | 0 refills | Status: DC

## 2017-05-12 MED ORDER — LISINOPRIL-HYDROCHLOROTHIAZIDE 10-12.5 MG PO TABS
1.0000 | ORAL_TABLET | Freq: Every day | ORAL | 0 refills | Status: DC
Start: 2017-05-12 — End: 2017-05-12

## 2017-05-12 MED ORDER — LISINOPRIL-HYDROCHLOROTHIAZIDE 10-12.5 MG PO TABS: 1.0000 | ORAL_TABLET | Freq: Every day | ORAL | 1 refills | Status: DC

## 2017-05-12 MED ORDER — ATORVASTATIN CALCIUM 40 MG PO TABS: ORAL_TABLET | ORAL | 1 refills | Status: DC

## 2017-05-12 MED ORDER — LEVOTHYROXINE SODIUM 125 MCG PO TABS: 125.0000 ug | ORAL_TABLET | Freq: Every day | ORAL | 1 refills | Status: DC

## 2017-05-12 MED ORDER — SITAGLIPTIN PHOS-METFORMIN HCL 50-1000 MG PO TABS: 1.0000 | ORAL_TABLET | Freq: Two times a day (BID) | ORAL | 1 refills | Status: DC

## 2017-05-12 NOTE — Progress Notes (Signed)
Subjective:    Patient ID: Tyler Duran, male    DOB: May 12, 1971, 46 y.o.   MRN: [id number]  HPI  Tyler Duran is here today for follow up of chronic medical problem.  Outpatient Encounter Medications as of 05/12/2017  Medication Sig  . atorvastatin (LIPITOR) 40 MG tablet TAKE 1 TABLET (40 MG TOTAL) BY MOUTH DAILY.  Marland Kitchen glimepiride (AMARYL) 4 MG tablet Take 1 tablet (4 mg total) by mouth daily with breakfast.  . glucose blood (ONETOUCH VERIO) test strip Check blood sugar every morning fasting and prn  Dx 250.02  . indomethacin (INDOCIN) 50 MG capsule Take 1 capsule (50 mg total) by mouth 3 (three) times daily with meals as needed.  Marland Kitchen levothyroxine (SYNTHROID, LEVOTHROID) 125 MCG tablet Take 1 tablet (125 mcg total) by mouth daily before breakfast.  . lisinopril-hydrochlorothiazide (PRINZIDE,ZESTORETIC) 10-12.5 MG tablet Take 1 tablet by mouth daily.  . sitaGLIPtin-metformin (JANUMET) 50-1000 MG tablet Take 1 tablet by mouth 2 (two) times daily with a meal. (Patient not taking: Reported on 05/12/2017)     1. Essential hypertension  No c/o chest pain, sob or headache. Does nt check blood pressure at home. BP Readings from Last 3 Encounters:  05/12/17 129/81  02/01/17 113/74  10/29/16 137/83     2. Hyperlipidemia with target LDL less than 100  Does not watch diet  3. Type 2 diabetes mellitus without complication, without long-term current use of insulin ([place]) last hgba1c was 8.1%. Started him on janumet. Patient has quit testing his blood sugars. Patient did not start on janumet. It was going to cost him $400 so he sent ot back. He also ran out of metformin about 5 weeks ago.  4. Hypothyroidism, unspecified type  No problems that he is aware of  5. Morbid obesity ([place])  No recent weight changes    New complaints: None today  Social history: Freight forwarder at [employer]  Constitutional: Negative for activity change and appetite change.  HENT: Negative.   Eyes:  Negative for pain.  Respiratory: Negative for shortness of breath.   Cardiovascular: Negative for chest pain, palpitations and leg swelling.  Gastrointestinal: Negative for abdominal pain.  Endocrine: Negative for polydipsia.  Genitourinary: Negative.   Skin: Negative for rash.  Neurological: Negative for dizziness, weakness and headaches.  Hematological: Does not bruise/bleed easily.  Psychiatric/Behavioral: Negative.   All other systems reviewed and are negative.      Objective:   Physical Exam  Constitutional: He is oriented to person, place, and time. He appears well-developed and well-nourished.  HENT:  Head: Normocephalic.  Right Ear: External ear normal.  Left Ear: External ear normal.  Nose: Nose normal.  Mouth/Throat: Oropharynx is clear and moist.  Eyes: EOM are normal. Pupils are equal, round, and reactive to light.  Neck: Normal range of motion. Neck supple. No JVD present. No thyromegaly present.  Cardiovascular: Normal rate, regular rhythm, normal heart sounds and intact distal pulses. Exam reveals no gallop and no friction rub.  No murmur heard. Pulmonary/Chest: Effort normal and breath sounds normal. No respiratory distress. He has no wheezes. He has no rales. He exhibits no tenderness.  Abdominal: Soft. Bowel sounds are normal. He exhibits no mass. There is no tenderness.  Genitourinary: Prostate normal and penis normal.  Musculoskeletal: Normal range of motion. He exhibits no edema.  Lymphadenopathy:    He has no cervical adenopathy.  Neurological: He is alert and oriented to person, place, and time. No cranial nerve  deficit.  Skin: Skin is warm and dry.  Psychiatric: He has a normal mood and affect. His behavior is normal. Judgment and thought content normal.   BP 129/81   Pulse 98   Temp 98.1 F (36.7 C) (Oral)   Ht '6\' 1"'$  (1.854 m)   Wt 276 lb 12.8 oz (125.6 kg)   BMI 36.52 kg/m   hgba1c 9.6% today      Assessment & Plan:  1. Essential  hypertension Low sodium diet - CMP14+EGFR - lisinopril-hydrochlorothiazide (PRINZIDE,ZESTORETIC) 10-12.5 MG tablet; Take 1 tablet by mouth daily.  Dispense: 90 tablet; Refill: 1  2. Hyperlipidemia with target LDL less than 100 Low fat diet - Lipid panel - atorvastatin (LIPITOR) 40 MG tablet; TAKE 1 TABLET (40 MG TOTAL) BY MOUTH DAILY.  Dispense: 90 tablet; Refill: 1  3. Type 2 diabetes mellitus without complication, without long-term current use of insulin (HCC) Do not run out of meds again If you have trouble with cost of janumet- let me know Check blood sugar fasting daily - Bayer DCA Hb A1c Waived - glimepiride (AMARYL) 4 MG tablet; Take 1 tablet (4 mg total) by mouth daily with breakfast.  Dispense: 90 tablet; Refill: 0 - sitaGLIPtin-metformin (JANUMET) 50-1000 MG tablet; Take 1 tablet by mouth 2 (two) times daily with a meal.  Dispense: 180 tablet; Refill: 1  4. Hypothyroidism, unspecified type  5. Morbid obesity ([place]) Discussed diet and exercise for person with BMI >25 Will recheck weight in 3-6 months  6. Acquired hypothyroidism - levothyroxine (SYNTHROID, LEVOTHROID) 125 MCG tablet; Take 1 tablet (125 mcg total) by mouth daily before breakfast.  Dispense: 90 tablet; Refill: 1    Labs pending Health maintenance reviewed Diet and exercise encouraged Continue all meds Follow up  In 3 months   [place], FNP

## 2017-05-12 NOTE — Patient Instructions (Signed)

## 2017-05-13 LAB — CMP14+EGFR
ALK PHOS: 118 IU/L — AB (ref 39–117)
ALT: 36 IU/L (ref 0–44)
AST: 16 IU/L (ref 0–40)
Albumin/Globulin Ratio: 1.5 (ref 1.2–2.2)
Albumin: 4.4 g/dL (ref 3.5–5.5)
BILIRUBIN TOTAL: 0.5 mg/dL (ref 0.0–1.2)
BUN/Creatinine Ratio: 11 (ref 9–20)
BUN: 13 mg/dL (ref 6–24)
CHLORIDE: 98 mmol/L (ref 96–106)
CO2: 26 mmol/L (ref 20–29)
CREATININE: 1.17 mg/dL (ref 0.76–1.27)
Calcium: 9.6 mg/dL (ref 8.7–10.2)
GFR calc Af Amer: 85 mL/min/{1.73_m2} (ref >59)
GFR calc non Af Amer: 73 mL/min/{1.73_m2} (ref >59)
GLUCOSE: 253 mg/dL — AB (ref 65–99)
Globulin, Total: 2.9 g/dL (ref 1.5–4.5)
Potassium: 4.8 mmol/L (ref 3.5–5.2)
Sodium: 139 mmol/L (ref 134–144)
Total Protein: 7.3 g/dL (ref 6.0–8.5)

## 2017-05-13 LAB — LIPID PANEL
CHOLESTEROL TOTAL: 134 mg/dL (ref 100–199)
Chol/HDL Ratio: 4.1 ratio (ref 0.0–5.0)
HDL: 33 mg/dL — AB (ref >39)
LDL Calculated: 59 mg/dL (ref 0–99)
TRIGLYCERIDES: 208 mg/dL — AB (ref 0–149)
VLDL CHOLESTEROL CAL: 42 mg/dL — AB (ref 5–40)

## 2017-08-10 ENCOUNTER — Encounter: Payer: Self-pay | Admitting: Nurse Practitioner

## 2017-08-10 ENCOUNTER — Other Ambulatory Visit: Payer: BLUE CROSS/BLUE SHIELD

## 2017-08-10 ENCOUNTER — Ambulatory Visit: Payer: BLUE CROSS/BLUE SHIELD | Admitting: Nurse Practitioner

## 2017-08-10 VITALS — BP 115/76 | HR 79 | Temp 97.5°F | Ht 73.0 in | Wt 274.6 lb

## 2017-08-10 DIAGNOSIS — Z125 Encounter for screening for malignant neoplasm of prostate: Secondary | ICD-10-CM | POA: Diagnosis not present

## 2017-08-10 DIAGNOSIS — E785 Hyperlipidemia, unspecified: Secondary | ICD-10-CM

## 2017-08-10 DIAGNOSIS — Z0000 Encounter for general adult medical examination without abnormal findings: Secondary | ICD-10-CM

## 2017-08-10 DIAGNOSIS — E039 Hypothyroidism, unspecified: Secondary | ICD-10-CM

## 2017-08-10 DIAGNOSIS — E119 Type 2 diabetes mellitus without complications: Secondary | ICD-10-CM

## 2017-08-10 DIAGNOSIS — I10 Essential (primary) hypertension: Secondary | ICD-10-CM

## 2017-08-10 LAB — BAYER DCA HB A1C WAIVED: HB A1C (BAYER DCA - WAIVED): 6.3 % (ref <7.0)

## 2017-08-10 NOTE — Progress Notes (Addendum)
Subjective:    Patient ID: Tyler Duran, male    DOB: 04-Jan-1971, 47 y.o.   MRN: [id number]  HPI  Tyler Duran is here today for annual physical exam and  follow up of chronic medical problem.  Outpatient Encounter Medications as of 08/10/2017  Medication Sig  . atorvastatin (LIPITOR) 40 MG tablet TAKE 1 TABLET (40 MG TOTAL) BY MOUTH DAILY.  Marland Kitchen glimepiride (AMARYL) 4 MG tablet Take 1 tablet (4 mg total) by mouth daily with breakfast.  . glucose blood (ONETOUCH VERIO) test strip Check blood sugar every morning fasting and prn  Dx 250.02  . indomethacin (INDOCIN) 50 MG capsule Take 1 capsule (50 mg total) by mouth 3 (three) times daily with meals as needed.  Marland Kitchen levothyroxine (SYNTHROID, LEVOTHROID) 125 MCG tablet Take 1 tablet (125 mcg total) by mouth daily before breakfast.  . lisinopril-hydrochlorothiazide (PRINZIDE,ZESTORETIC) 10-12.5 MG tablet Take 1 tablet by mouth daily.  . sitaGLIPtin-metformin (JANUMET) 50-1000 MG tablet Take 1 tablet by mouth 2 (two) times daily with a meal.    1. Essential hypertension  Denies chest pain, headache and SOB. Does not check blood pressure at home.  2. Type 2 diabetes mellitus without complication, without long-term current use of insulin ([place]) last hgba1c was 8.1%. He was suppose to have started on jamumet  But never did so we started him on it at last visit. Fasting blood sugars average betwenn 140-150.   3. Hypothyroidism, unspecified type  No problems that he is aware of  4. Hyperlipidemia with target LDL less than 100  Doe snot really watch det very closely  5. 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  HENT: Negative.   Eyes: Negative for pain.  Respiratory: Negative for shortness of breath.   Cardiovascular: Negative for chest  pain, palpitations and leg swelling.  Gastrointestinal: Negative for abdominal pain.  Endocrine: Negative for polydipsia.  Genitourinary: Negative.   Skin: Negative for rash.  Neurological: Negative for dizziness, weakness and headaches.  Hematological: Does not bruise/bleed easily.  Psychiatric/Behavioral: Negative.   All other systems reviewed and are negative.      Objective:   Physical Exam  Constitutional: He is oriented to person, place, and time. He appears well-developed and well-nourished.  HENT:  Head: Normocephalic.  Right Ear: External ear normal.  Left Ear: External ear normal.  Nose: Nose normal.  Mouth/Throat: Oropharynx is clear and moist.  Eyes: EOM are normal. Pupils are equal, round, and reactive to light.  Neck: Normal range of motion. Neck supple. No JVD present. No thyromegaly present.  Cardiovascular: Normal rate, regular rhythm, normal heart sounds and intact distal pulses. Exam reveals no gallop and no friction rub.  No murmur heard. Pulmonary/Chest: Effort normal and breath sounds normal. No respiratory distress. He has no wheezes. He has no rales. He exhibits no tenderness.  Abdominal: Soft. Bowel sounds are normal. He exhibits no mass. There is no tenderness.  Genitourinary:  Genitourinary Comments: Denies any problems with private parts  Musculoskeletal: Normal range of motion. He exhibits no edema.  Lymphadenopathy:    He has no cervical adenopathy.  Neurological: He is alert and oriented to person, place, and time. No cranial nerve deficit.  Skin: Skin is warm and dry.  Psychiatric: He has a normal mood and  affect. His behavior is normal. Judgment and thought content normal.   BP 115/76   Pulse 79   Temp (!) 97.5 F (36.4 C) (Oral)   Ht 6\' 1"  (1.854 m)   Wt 274 lb 9.6 oz (124.6 kg)   BMI 36.23 kg/m    hgba1c 6.3%        Assessment & Plan:   1. Essential hypertension   2. Annual physical exam   3. Type 2 diabetes mellitus without  complication, without long-term current use of insulin ([place])   4. Hypothyroidism, unspecified type   5. Hyperlipidemia with target LDL less than 100   6. Morbid obesity ([place])   7. Prostate cancer screening    Continue to watch carbs in diet- doing great Labs pending exercise encouraged RTO in 3 months  [place], FNP

## 2017-08-10 NOTE — Patient Instructions (Signed)

## 2017-08-11 LAB — LIPID PANEL
CHOL/HDL RATIO: 3.8 ratio (ref 0.0–5.0)
Cholesterol, Total: 115 mg/dL (ref 100–199)
HDL: 30 mg/dL — ABNORMAL LOW (ref >39)
LDL CALC: 51 mg/dL (ref 0–99)
TRIGLYCERIDES: 168 mg/dL — AB (ref 0–149)
VLDL Cholesterol Cal: 34 mg/dL (ref 5–40)

## 2017-08-11 LAB — CBC WITH DIFFERENTIAL/PLATELET
BASOS: 1 %
Basophils Absolute: 0.1 10*3/uL (ref 0.0–0.2)
EOS (ABSOLUTE): 0.6 10*3/uL — AB (ref 0.0–0.4)
Eos: 5 %
Hematocrit: 43.2 % (ref 37.5–51.0)
Hemoglobin: 14.3 g/dL (ref 13.0–17.7)
Immature Grans (Abs): 0 10*3/uL (ref 0.0–0.1)
Immature Granulocytes: 0 %
Lymphocytes Absolute: 3.3 10*3/uL — ABNORMAL HIGH (ref 0.7–3.1)
Lymphs: 27 %
MCH: 28.5 pg (ref 26.6–33.0)
MCHC: 33.1 g/dL (ref 31.5–35.7)
MCV: 86 fL (ref 79–97)
Monocytes Absolute: 0.9 10*3/uL (ref 0.1–0.9)
Monocytes: 7 %
NEUTROS ABS: 7.3 10*3/uL — AB (ref 1.4–7.0)
Neutrophils: 60 %
PLATELETS: 254 10*3/uL (ref 150–379)
RBC: 5.01 x10E6/uL (ref 4.14–5.80)
RDW: 13.9 % (ref 12.3–15.4)
WBC: 12.2 10*3/uL — ABNORMAL HIGH (ref 3.4–10.8)

## 2017-08-11 LAB — CMP14+EGFR
ALT: 29 IU/L (ref 0–44)
AST: 18 IU/L (ref 0–40)
Albumin/Globulin Ratio: 1.5 (ref 1.2–2.2)
Albumin: 4.3 g/dL (ref 3.5–5.5)
Alkaline Phosphatase: 89 IU/L (ref 39–117)
BUN/Creatinine Ratio: 15 (ref 9–20)
BUN: 17 mg/dL (ref 6–24)
Bilirubin Total: 0.5 mg/dL (ref 0.0–1.2)
CALCIUM: 9.8 mg/dL (ref 8.7–10.2)
CO2: 23 mmol/L (ref 20–29)
CREATININE: 1.16 mg/dL (ref 0.76–1.27)
Chloride: 104 mmol/L (ref 96–106)
GFR, EST AFRICAN AMERICAN: 86 mL/min/{1.73_m2} (ref >59)
GFR, EST NON AFRICAN AMERICAN: 74 mL/min/{1.73_m2} (ref >59)
GLOBULIN, TOTAL: 2.8 g/dL (ref 1.5–4.5)
Glucose: 137 mg/dL — ABNORMAL HIGH (ref 65–99)
Potassium: 5.4 mmol/L — ABNORMAL HIGH (ref 3.5–5.2)
SODIUM: 147 mmol/L — AB (ref 134–144)
TOTAL PROTEIN: 7.1 g/dL (ref 6.0–8.5)

## 2017-08-11 LAB — PSA, TOTAL AND FREE
PROSTATE SPECIFIC AG, SERUM: 0.4 ng/mL (ref 0.0–4.0)
PSA FREE: 0.1 ng/mL
PSA, Free Pct: 25 %

## 2017-09-27 ENCOUNTER — Other Ambulatory Visit: Payer: Self-pay | Admitting: Nurse Practitioner

## 2017-09-27 NOTE — Telephone Encounter (Signed)
What is the name of the medication? indomethacin (INDOCIN) 50 MG capsule  Have you contacted your pharmacy to request a refill? yes  Which pharmacy would you like this sent to? [facility]   Patient notified that their request is being sent to the clinical staff for review and that they should receive a call once it is complete. If they do not receive a call within 24 hours they can check with their pharmacy or our office.

## 2017-09-28 MED ORDER — INDOMETHACIN 50 MG PO CAPS: 50.0000 mg | ORAL_CAPSULE | Freq: Three times a day (TID) | ORAL | 3 refills | Status: DC | PRN

## 2017-09-28 NOTE — Telephone Encounter (Signed)
Refill sent to pharmacy.   

## 2017-11-18 ENCOUNTER — Ambulatory Visit: Payer: BLUE CROSS/BLUE SHIELD | Admitting: Nurse Practitioner

## 2017-11-18 ENCOUNTER — Encounter: Payer: Self-pay | Admitting: Nurse Practitioner

## 2017-11-18 VITALS — BP 101/71 | HR 85 | Temp 97.0°F | Ht 73.0 in | Wt 271.0 lb

## 2017-11-18 DIAGNOSIS — E785 Hyperlipidemia, unspecified: Secondary | ICD-10-CM

## 2017-11-18 DIAGNOSIS — E039 Hypothyroidism, unspecified: Secondary | ICD-10-CM

## 2017-11-18 DIAGNOSIS — E119 Type 2 diabetes mellitus without complications: Secondary | ICD-10-CM | POA: Diagnosis not present

## 2017-11-18 DIAGNOSIS — I10 Essential (primary) hypertension: Secondary | ICD-10-CM

## 2017-11-18 LAB — BAYER DCA HB A1C WAIVED: HB A1C: 6.5 % (ref <7.0)

## 2017-11-18 MED ORDER — LISINOPRIL-HYDROCHLOROTHIAZIDE 10-12.5 MG PO TABS: 1.0000 | ORAL_TABLET | Freq: Every day | ORAL | 1 refills | Status: DC

## 2017-11-18 MED ORDER — ATORVASTATIN CALCIUM 40 MG PO TABS: ORAL_TABLET | ORAL | 1 refills | Status: DC

## 2017-11-18 MED ORDER — LEVOTHYROXINE SODIUM 125 MCG PO TABS: 125.0000 ug | ORAL_TABLET | Freq: Every day | ORAL | 1 refills | Status: DC

## 2017-11-18 MED ORDER — GLIMEPIRIDE 4 MG PO TABS: 4.0000 mg | ORAL_TABLET | Freq: Every day | ORAL | 0 refills | Status: DC

## 2017-11-18 NOTE — Patient Instructions (Signed)

## 2017-11-18 NOTE — Progress Notes (Signed)
Subjective:    Patient ID: Tyler Duran, male    DOB: March 10, 1971, 47 y.o.   MRN: [id number]   Chief Complaint: Medical Management of Chronic Issues   HPI:  1. Type 2 diabetes mellitus without complication, without long-term current use of insulin (HCC) blood sugars are running between 140-160 on avergae.   2. Essential hypertension  No c/o chest pain, sob or headache. Does not check blood pressure at home. BP Readings from Last 3 Encounters:  11/18/17 101/71  08/10/17 115/76  05/12/17 129/81     3. Hyperlipidemia with target LDL less than 100  Doe snot really watch diet very closely- stays very active but does no designated exercise.   4. Hypothyroidism, unspecified type  Takes synthroid daily- no c/o fatigue.  5. Morbid obesity ([place])  No recent weight changes    Outpatient Encounter Medications as of 11/18/2017  Medication Sig  . atorvastatin (LIPITOR) 40 MG tablet TAKE 1 TABLET (40 MG TOTAL) BY MOUTH DAILY.  Marland Kitchen glimepiride (AMARYL) 4 MG tablet Take 1 tablet (4 mg total) by mouth daily with breakfast.  . glucose blood (ONETOUCH VERIO) test strip Check blood sugar every morning fasting and prn  Dx 250.02  . indomethacin (INDOCIN) 50 MG capsule Take 1 capsule (50 mg total) by mouth 3 (three) times daily with meals as needed.  Marland Kitchen levothyroxine (SYNTHROID, LEVOTHROID) 125 MCG tablet Take 1 tablet (125 mcg total) by mouth daily before breakfast.  . lisinopril-hydrochlorothiazide (PRINZIDE,ZESTORETIC) 10-12.5 MG tablet Take 1 tablet by mouth daily.  . sitaGLIPtin-metformin (JANUMET) 50-1000 MG tablet Take 1 tablet by mouth 2 (two) times daily with a meal.      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   Review of Systems  Constitutional: Negative for activity change and appetite change.  HENT: Negative.   Eyes: Negative for pain.  Respiratory: Negative for shortness of breath.   Cardiovascular: Negative  for chest pain, palpitations and leg swelling.  Gastrointestinal: Negative for abdominal pain.  Endocrine: Negative for polydipsia.  Genitourinary: Negative.   Skin: Negative for rash.  Neurological: Negative for dizziness, weakness and headaches.  Hematological: Does not bruise/bleed easily.  Psychiatric/Behavioral: Negative.   All other systems reviewed and are negative.      Objective:   Physical Exam  Constitutional: He is oriented to person, place, and time.  HENT:  Head: Normocephalic.  Nose: Nose normal.  Mouth/Throat: Oropharynx is clear and moist.  Eyes: Pupils are equal, round, and reactive to light. EOM are normal.  Neck: Normal range of motion and phonation normal. Neck supple. No JVD present. Carotid bruit is not present. No thyroid mass and no thyromegaly present.  Cardiovascular: Normal rate and regular rhythm.  Pulmonary/Chest: Effort normal and breath sounds normal. No respiratory distress.  Abdominal: Soft. Normal appearance, normal aorta and bowel sounds are normal. There is no tenderness.  Musculoskeletal: Normal range of motion.  Lymphadenopathy:    He has no cervical adenopathy.  Neurological: He is alert and oriented to person, place, and time.  Skin: Skin is warm and dry.  Psychiatric: Judgment normal.    BP 101/71   Pulse 85   Temp (!) 97 F (36.1 C) (Oral)   Ht '6\' 1"'$  (1.854 m)   Wt 271 lb (122.9 kg)   BMI 35.75 kg/m        Assessment & Plan:  [name] comes in today with chief complaint of Medical Management of  Chronic Issues   Diagnosis and orders addressed:  1. Type 2 diabetes mellitus without complication, without long-term current use of insulin (H[facility]) Continue to watch carbs in diet - Bayer DCA Hb A1c Waived - Microalbumin / creatinine urine ratio - glimepiride (AMARYL) 4 MG tablet; Take 1 tablet (4 mg total) by mouth daily with breakfast.  Dispense: 90 tablet; Refill: 0  2. Essential hypertension Low sodium diet -  CMP14+EGFR - lisinopril-hydrochlorothiazide (PRINZIDE,ZESTORETIC) 10-12.5 MG tablet; Take 1 tablet by mouth daily.  Dispense: 90 tablet; Refill: 1  3. Hyperlipidemia with target LDL less than 100 Low fat diet - Lipid panel - atorvastatin (LIPITOR) 40 MG tablet; TAKE 1 TABLET (40 MG TOTAL) BY MOUTH DAILY.  Dispense: 90 tablet; Refill: 1  4. Hypothyroidism, unspecified type Labs pending  5. Morbid obesity ([place]) Discussed diet and exercise for person with BMI >25 Will recheck weight in 3-6 months  6. Acquired hypothyroidism - levothyroxine (SYNTHROID, LEVOTHROID) 125 MCG tablet; Take 1 tablet (125 mcg total) by mouth daily before breakfast.  Dispense: 90 tablet; Refill: 1   Labs pending Health Maintenance reviewed Diet and exercise encouraged  Follow up plan: 3 months   Mary-Margaret Hassell Done, FNP

## 2017-11-19 LAB — CMP14+EGFR
A/G RATIO: 1.6 (ref 1.2–2.2)
ALT: 31 IU/L (ref 0–44)
AST: 20 IU/L (ref 0–40)
Albumin: 4.2 g/dL (ref 3.5–5.5)
Alkaline Phosphatase: 79 IU/L (ref 39–117)
BUN/Creatinine Ratio: 15 (ref 9–20)
BUN: 17 mg/dL (ref 6–24)
Bilirubin Total: 0.5 mg/dL (ref 0.0–1.2)
CALCIUM: 9.4 mg/dL (ref 8.7–10.2)
CO2: 24 mmol/L (ref 20–29)
Chloride: 101 mmol/L (ref 96–106)
Creatinine, Ser: 1.11 mg/dL (ref 0.76–1.27)
GFR, EST AFRICAN AMERICAN: 90 mL/min/{1.73_m2} (ref >59)
GFR, EST NON AFRICAN AMERICAN: 78 mL/min/{1.73_m2} (ref >59)
GLOBULIN, TOTAL: 2.7 g/dL (ref 1.5–4.5)
Glucose: 111 mg/dL — ABNORMAL HIGH (ref 65–99)
POTASSIUM: 4.6 mmol/L (ref 3.5–5.2)
Sodium: 141 mmol/L (ref 134–144)
Total Protein: 6.9 g/dL (ref 6.0–8.5)

## 2017-11-19 LAB — LIPID PANEL
Chol/HDL Ratio: 3.8 ratio (ref 0.0–5.0)
Cholesterol, Total: 118 mg/dL (ref 100–199)
HDL: 31 mg/dL — AB (ref >39)
LDL Calculated: 55 mg/dL (ref 0–99)
Triglycerides: 159 mg/dL — ABNORMAL HIGH (ref 0–149)
VLDL Cholesterol Cal: 32 mg/dL (ref 5–40)

## 2017-12-13 ENCOUNTER — Telehealth: Payer: Self-pay

## 2017-12-13 ENCOUNTER — Other Ambulatory Visit: Payer: Self-pay | Admitting: Nurse Practitioner

## 2017-12-13 DIAGNOSIS — E119 Type 2 diabetes mellitus without complications: Secondary | ICD-10-CM

## 2017-12-13 MED ORDER — METFORMIN HCL 1000 MG PO TABS: 1000.0000 mg | ORAL_TABLET | Freq: Every day | ORAL | 11 refills | Status: DC

## 2017-12-13 NOTE — Telephone Encounter (Signed)
Will change just to metformin 1000mg  bid since hgba1c was 6.2- if blood sugars starts going up ned to let me know.

## 2017-12-13 NOTE — Telephone Encounter (Signed)
Patient cannot afford Janumet. It is 400 a month. Would like something cheaper sent in and sent to Tyler Duran in [place].

## 2017-12-14 NOTE — Telephone Encounter (Signed)
Patient aware.

## 2017-12-28 ENCOUNTER — Telehealth: Payer: Self-pay | Admitting: Nurse Practitioner

## 2017-12-28 NOTE — Telephone Encounter (Signed)
Prescribed metformin 1000mg  qd. Was taking Janumet BID but switched back due to cost. Has taken metformin 1000mg  BID previously and felt like the once a day prescription may be a mistake.  Please reorder if incorrect. Patient aware that he will not get a call back until 12/30/17.

## 2017-12-30 ENCOUNTER — Other Ambulatory Visit: Payer: Self-pay | Admitting: Nurse Practitioner

## 2017-12-30 DIAGNOSIS — E119 Type 2 diabetes mellitus without complications: Secondary | ICD-10-CM

## 2017-12-30 MED ORDER — METFORMIN HCL 1000 MG PO TABS: 1000.0000 mg | ORAL_TABLET | Freq: Two times a day (BID) | ORAL | 11 refills | Status: DC

## 2017-12-30 NOTE — Telephone Encounter (Signed)
There is a metformin that is just 1x a day but the bid medication was sent in. I corrected and yes you should take BID.

## 2017-12-30 NOTE — Telephone Encounter (Signed)
Pt notified of corrected RX

## 2018-02-15 ENCOUNTER — Telehealth: Payer: Self-pay | Admitting: Nurse Practitioner

## 2018-02-18 ENCOUNTER — Encounter: Payer: Self-pay | Admitting: Nurse Practitioner

## 2018-02-18 ENCOUNTER — Ambulatory Visit: Payer: BLUE CROSS/BLUE SHIELD | Admitting: Nurse Practitioner

## 2018-02-18 VITALS — BP 119/80 | HR 93 | Temp 98.0°F | Ht 73.0 in | Wt 270.0 lb

## 2018-02-18 DIAGNOSIS — E039 Hypothyroidism, unspecified: Secondary | ICD-10-CM

## 2018-02-18 DIAGNOSIS — E785 Hyperlipidemia, unspecified: Secondary | ICD-10-CM | POA: Diagnosis not present

## 2018-02-18 DIAGNOSIS — I10 Essential (primary) hypertension: Secondary | ICD-10-CM | POA: Diagnosis not present

## 2018-02-18 DIAGNOSIS — E119 Type 2 diabetes mellitus without complications: Secondary | ICD-10-CM | POA: Diagnosis not present

## 2018-02-18 LAB — CMP14+EGFR
A/G RATIO: 1.6 (ref 1.2–2.2)
ALT: 43 IU/L (ref 0–44)
AST: 33 IU/L (ref 0–40)
Albumin: 4.3 g/dL (ref 3.5–5.5)
Alkaline Phosphatase: 88 IU/L (ref 39–117)
BUN/Creatinine Ratio: 14 (ref 9–20)
BUN: 18 mg/dL (ref 6–24)
Bilirubin Total: 0.5 mg/dL (ref 0.0–1.2)
CALCIUM: 9.6 mg/dL (ref 8.7–10.2)
CO2: 25 mmol/L (ref 20–29)
CREATININE: 1.26 mg/dL (ref 0.76–1.27)
Chloride: 100 mmol/L (ref 96–106)
GFR calc Af Amer: 77 mL/min/{1.73_m2} (ref >59)
GFR calc non Af Amer: 67 mL/min/{1.73_m2} (ref >59)
Globulin, Total: 2.7 g/dL (ref 1.5–4.5)
Glucose: 138 mg/dL — ABNORMAL HIGH (ref 65–99)
POTASSIUM: 4.7 mmol/L (ref 3.5–5.2)
SODIUM: 141 mmol/L (ref 134–144)
Total Protein: 7 g/dL (ref 6.0–8.5)

## 2018-02-18 LAB — LIPID PANEL
CHOL/HDL RATIO: 4 ratio (ref 0.0–5.0)
CHOLESTEROL TOTAL: 127 mg/dL (ref 100–199)
HDL: 32 mg/dL — ABNORMAL LOW (ref >39)
LDL Calculated: 56 mg/dL (ref 0–99)
TRIGLYCERIDES: 195 mg/dL — AB (ref 0–149)
VLDL Cholesterol Cal: 39 mg/dL (ref 5–40)

## 2018-02-18 LAB — BAYER DCA HB A1C WAIVED: HB A1C: 7.3 % — AB (ref <7.0)

## 2018-02-18 NOTE — Patient Instructions (Signed)

## 2018-02-18 NOTE — Progress Notes (Signed)
Subjective:    Patient ID: Tyler Duran, male    DOB: 09/02/1970, 47 y.o.   MRN: [id number]   Chief Complaint: Medical Management of Chronic issues  HPI:  1. Essential hypertension  No c/o chest pain, sob or headache. Does not check blood pressure at home. BP Readings from Last 3 Encounters:  11/18/17 101/71  08/10/17 115/76  05/12/17 129/81     2. Type 2 diabetes mellitus without complication, without long-term current use of insulin ([facility]) last hgba1c was 6.5. He tries to watch diet. Blood sugars have been consistently below 140. No hypoglycemia.  3. Hypothyroidism, unspecified type  Not having any problems that he is aware of.  4. Hyperlipidemia with target LDL less than 100  Does very little exercise.  5. Morbid obesity ([place])  No recent weight changes    Outpatient Encounter Medications as of 02/18/2018  Medication Sig  . atorvastatin (LIPITOR) 40 MG tablet TAKE 1 TABLET (40 MG TOTAL) BY MOUTH DAILY.  Marland Kitchen glimepiride (AMARYL) 4 MG tablet Take 1 tablet (4 mg total) by mouth daily with breakfast.  . glucose blood (ONETOUCH VERIO) test strip Check blood sugar every morning fasting and prn  Dx 250.02  . indomethacin (INDOCIN) 50 MG capsule Take 1 capsule (50 mg total) by mouth 3 (three) times daily with meals as needed.  Marland Kitchen levothyroxine (SYNTHROID, LEVOTHROID) 125 MCG tablet Take 1 tablet (125 mcg total) by mouth daily before breakfast.  . lisinopril-hydrochlorothiazide (PRINZIDE,ZESTORETIC) 10-12.5 MG tablet Take 1 tablet by mouth daily.  . metFORMIN (GLUCOPHAGE) 1000 MG tablet Take 1 tablet (1,000 mg total) by mouth 2 (two) times daily with a meal.      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   Review of Systems  Constitutional: Negative for activity change and appetite change.  HENT: Negative.   Eyes: Negative for pain.  Respiratory: Negative for shortness of breath.   Cardiovascular: Negative  for chest pain, palpitations and leg swelling.  Gastrointestinal: Negative for abdominal pain.  Endocrine: Negative for polydipsia.  Genitourinary: Negative.   Skin: Negative for rash.  Neurological: Negative for dizziness, weakness and headaches.  Hematological: Does not bruise/bleed easily.  Psychiatric/Behavioral: Negative.   All other systems reviewed and are negative.      Objective:   Physical Exam  Constitutional: He is oriented to person, place, and time. He appears well-developed and well-nourished.  HENT:  Head: Normocephalic.  Nose: Nose normal.  Mouth/Throat: Oropharynx is clear and moist.  Eyes: Pupils are equal, round, and reactive to light. EOM are normal.  Neck: Normal range of motion and phonation normal. Neck supple. No JVD present. Carotid bruit is not present. No thyroid mass and no thyromegaly present.  Cardiovascular: Normal rate and regular rhythm.  Pulmonary/Chest: Effort normal and breath sounds normal. No respiratory distress.  Abdominal: Soft. Normal appearance, normal aorta and bowel sounds are normal. There is no tenderness.  Musculoskeletal: Normal range of motion.  Lymphadenopathy:    He has no cervical adenopathy.  Neurological: He is alert and oriented to person, place, and time.  Skin: Skin is warm and dry.  Psychiatric: He has a normal mood and affect. His behavior is normal. Judgment and thought content normal.  Nursing note and vitals reviewed.  BP 119/80   Pulse 93   Temp 98 F (36.7 C) (Oral)   Ht '6\' 1"'$  (1.854 m)   Wt 270 lb (122.5 kg)   BMI  35.62 kg/m   hgba1c 7.3%      Assessment & Plan:  Tyler Duran comes in today with chief complaint of Medical Management of Chronic Issues   Diagnosis and orders addressed:  1. Essential hypertension Low sodium diet - CMP14+EGFR  2. Type 2 diabetes mellitus without complication, without long-term current use of insulin ([facility]) Stricter carb counting - Bayer DCA Hb A1c Waived -  Microalbumin / creatinine urine ratio  3. Hypothyroidism, acquired 4. Hyperlipidemia with target LDL less than 100 Low fta diet encouraged - Lipid panel  5. Morbid obesity ([place]) Discussed diet and exercise for person with BMI >25 Will recheck weight in 3-6 months    Labs pending Health Maintenance reviewed Diet and exercise encouraged  Follow up plan: 3 months   Tyler Hassell Done, FNP

## 2018-02-19 LAB — MICROALBUMIN / CREATININE URINE RATIO
Creatinine, Urine: 76.5 mg/dL
Microalb/Creat Ratio: <3.9 mg/g creat (ref 0.0–30.0)
Microalbumin, Urine: <3 ug/mL

## 2018-02-28 NOTE — Telephone Encounter (Signed)
[employer] nurse never returned call, encounter closed

## 2018-04-04 ENCOUNTER — Other Ambulatory Visit: Payer: Self-pay | Admitting: Nurse Practitioner

## 2018-04-04 DIAGNOSIS — E119 Type 2 diabetes mellitus without complications: Secondary | ICD-10-CM

## 2018-05-26 ENCOUNTER — Ambulatory Visit: Payer: BLUE CROSS/BLUE SHIELD | Admitting: Nurse Practitioner

## 2018-05-26 ENCOUNTER — Encounter: Payer: Self-pay | Admitting: Nurse Practitioner

## 2018-05-26 VITALS — BP 128/68 | HR 84 | Temp 98.1°F | Ht 73.0 in | Wt 276.0 lb

## 2018-05-26 DIAGNOSIS — E119 Type 2 diabetes mellitus without complications: Secondary | ICD-10-CM

## 2018-05-26 DIAGNOSIS — E039 Hypothyroidism, unspecified: Secondary | ICD-10-CM | POA: Diagnosis not present

## 2018-05-26 DIAGNOSIS — E785 Hyperlipidemia, unspecified: Secondary | ICD-10-CM | POA: Diagnosis not present

## 2018-05-26 DIAGNOSIS — I10 Essential (primary) hypertension: Secondary | ICD-10-CM

## 2018-05-26 LAB — BAYER DCA HB A1C WAIVED: HB A1C (BAYER DCA - WAIVED): 7.1 % — ABNORMAL HIGH (ref <7.0)

## 2018-05-26 MED ORDER — GLIMEPIRIDE 4 MG PO TABS: ORAL_TABLET | ORAL | 1 refills | Status: DC

## 2018-05-26 MED ORDER — LEVOTHYROXINE SODIUM 125 MCG PO TABS: 125.0000 ug | ORAL_TABLET | Freq: Every day | ORAL | 1 refills | Status: DC

## 2018-05-26 MED ORDER — LISINOPRIL-HYDROCHLOROTHIAZIDE 10-12.5 MG PO TABS: 1.0000 | ORAL_TABLET | Freq: Every day | ORAL | 1 refills | Status: DC

## 2018-05-26 MED ORDER — ATORVASTATIN CALCIUM 40 MG PO TABS: ORAL_TABLET | ORAL | 1 refills | Status: DC

## 2018-05-26 MED ORDER — METFORMIN HCL 1000 MG PO TABS: 1000.0000 mg | ORAL_TABLET | Freq: Two times a day (BID) | ORAL | 1 refills | Status: DC

## 2018-05-26 NOTE — Patient Instructions (Signed)
Diabetes Mellitus and Foot Care  Foot care is an important part of your health, especially when you have diabetes. Diabetes may cause you to have problems because of poor blood flow (circulation) to your feet and legs, which can cause your skin to:   Become thinner and drier.   Break more easily.   Heal more slowly.   Peel and crack.  You may also have nerve damage (neuropathy) in your legs and feet, causing decreased feeling in them. This means that you may not notice minor injuries to your feet that could lead to more serious problems. Noticing and addressing any potential problems early is the best way to prevent future foot problems.  How to care for your feet  Foot hygiene   Wash your feet daily with warm water and mild soap. Do not use hot water. Then, pat your feet and the areas between your toes until they are completely dry. Do not soak your feet as this can dry your skin.   Trim your toenails straight across. Do not dig under them or around the cuticle. File the edges of your nails with an emery board or nail file.   Apply a moisturizing lotion or petroleum jelly to the skin on your feet and to dry, brittle toenails. Use lotion that does not contain alcohol and is unscented. Do not apply lotion between your toes.  Shoes and socks   Wear clean socks or stockings every day. Make sure they are not too tight. Do not wear knee-high stockings since they may decrease blood flow to your legs.   Wear shoes that fit properly and have enough cushioning. Always look in your shoes before you put them on to be sure there are no objects inside.   To break in new shoes, wear them for just a few hours a day. This prevents injuries on your feet.  Wounds, scrapes, corns, and calluses   Check your feet daily for blisters, cuts, bruises, sores, and redness. If you cannot see the bottom of your feet, use a mirror or ask someone for help.   Do not cut corns or calluses or try to remove them with medicine.   If you  find a minor scrape, cut, or break in the skin on your feet, keep it and the skin around it clean and dry. You may clean these areas with mild soap and water. Do not clean the area with peroxide, alcohol, or iodine.   If you have a wound, scrape, corn, or callus on your foot, look at it several times a day to make sure it is healing and not infected. Check for:  ? Redness, swelling, or pain.  ? Fluid or blood.  ? Warmth.  ? Pus or a bad smell.  General instructions   Do not cross your legs. This may decrease blood flow to your feet.   Do not use heating pads or hot water bottles on your feet. They may burn your skin. If you have lost feeling in your feet or legs, you may not know this is happening until it is too late.   Protect your feet from hot and cold by wearing shoes, such as at the beach or on hot pavement.   Schedule a complete foot exam at least once a year (annually) or more often if you have foot problems. If you have foot problems, report any cuts, sores, or bruises to your health care provider immediately.  Contact a health care provider if:     You have a medical condition that increases your risk of infection and you have any cuts, sores, or bruises on your feet.   You have an injury that is not healing.   You have redness on your legs or feet.   You feel burning or tingling in your legs or feet.   You have pain or cramps in your legs and feet.   Your legs or feet are numb.   Your feet always feel cold.   You have pain around a toenail.  Get help right away if:   You have a wound, scrape, corn, or callus on your foot and:  ? You have pain, swelling, or redness that gets worse.  ? You have fluid or blood coming from the wound, scrape, corn, or callus.  ? Your wound, scrape, corn, or callus feels warm to the touch.  ? You have pus or a bad smell coming from the wound, scrape, corn, or callus.  ? You have a fever.  ? You have a red line going up your leg.  Summary   Check your feet every day  for cuts, sores, red spots, swelling, and blisters.   Moisturize feet and legs daily.   Wear shoes that fit properly and have enough cushioning.   If you have foot problems, report any cuts, sores, or bruises to your health care provider immediately.   Schedule a complete foot exam at least once a year (annually) or more often if you have foot problems.  This information is not intended to replace advice given to you by your health care provider. Make sure you discuss any questions you have with your health care provider.  Document Released: 05/22/2000 Document Revised: 07/07/2017 Document Reviewed: 06/26/2016  [employer]vier Interactive Patient Education  2019 [employer]

## 2018-05-26 NOTE — Progress Notes (Signed)
Subjective:    Patient ID: Tyler Duran, male    DOB: [birth year], 47 y.o.   MRN: [id number]   Chief Complaint: medical management if chronic issues  HPI:  1. Essential hypertension  No c/o chest pan, sob or headache. Does not check blood pressure at home BP Readings from Last 3 Encounters:  02/18/18 119/80  11/18/17 101/71  08/10/17 115/76     2. Hypothyroidism, unspecified type  Not having any problems that she is aware of  3. Type 2 diabetes mellitus without complication, without long-term current use of insulin ([place]) last hgba1c was 7.3%. blood sugars are good when he watches what he eats. somethings like pizza will really increase blood sugars.  4. Morbid obesity ([place])  Weight is up 6 lbs from last visit  5. Hyperlipidemia with target LDL less than 100  Does not really watch diet. Does no exercise.    Outpatient Encounter Medications as of 05/26/2018  Medication Sig  . atorvastatin (LIPITOR) 40 MG tablet TAKE 1 TABLET (40 MG TOTAL) BY MOUTH DAILY.  Marland Kitchen glimepiride (AMARYL) 4 MG tablet TAKE 1 TABLET(4 MG) BY MOUTH DAILY WITH BREAKFAST  . glucose blood (ONETOUCH VERIO) test strip Check blood sugar every morning fasting and prn  Dx 250.02  . indomethacin (INDOCIN) 50 MG capsule Take 1 capsule (50 mg total) by mouth 3 (three) times daily with meals as needed. (Patient not taking: Reported on 02/18/2018)  . levothyroxine (SYNTHROID, LEVOTHROID) 125 MCG tablet Take 1 tablet (125 mcg total) by mouth daily before breakfast.  . lisinopril-hydrochlorothiazide (PRINZIDE,ZESTORETIC) 10-12.5 MG tablet Take 1 tablet by mouth daily.  . metFORMIN (GLUCOPHAGE) 1000 MG tablet Take 1 tablet (1,000 mg total) by mouth 2 (two) times daily with a meal.     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  HENT: Negative.   Eyes: Negative for pain.  Respiratory: Negative for shortness of  breath.   Cardiovascular: Negative for chest pain, palpitations and leg swelling.  Gastrointestinal: Negative for abdominal pain.  Endocrine: Negative for polydipsia.  Genitourinary: Negative.   Skin: Negative for rash.  Neurological: Negative for dizziness, weakness and headaches.  Hematological: Does not bruise/bleed easily.  Psychiatric/Behavioral: Negative.   All other systems reviewed and are negative.      Objective:   Physical Exam Vitals signs and nursing note reviewed.  Constitutional:      Appearance: Normal appearance. He is well-developed.  HENT:     Head: Normocephalic.     Nose: Nose normal.  Eyes:     Pupils: Pupils are equal, round, and reactive to light.  Neck:     Musculoskeletal: Normal range of motion and neck supple.     Thyroid: No thyroid mass or thyromegaly.     Vascular: No carotid bruit or JVD.     Trachea: Phonation normal.  Cardiovascular:     Rate and Rhythm: Normal rate and regular rhythm.  Pulmonary:     Effort: Pulmonary effort is normal. No respiratory distress.     Breath sounds: Normal breath sounds.  Abdominal:     General: Bowel sounds are normal.     Palpations: Abdomen is soft.     Tenderness: There is no abdominal tenderness.  Musculoskeletal: Normal range of motion.  Lymphadenopathy:     Cervical: No cervical adenopathy.  Skin:    General: Skin is warm and dry.  Neurological:     Mental  Status: He is alert and oriented to person, place, and time.  Psychiatric:        Behavior: Behavior normal.        Thought Content: Thought content normal.        Judgment: Judgment normal.    BP 128/68   Pulse 84   Temp 98.1 F (36.7 C) (Oral)   Ht 6' 1" (1.854 m)   Wt 276 lb (125.2 kg)   BMI 36.41 kg/m   hgba1c 7.1% tiday       Assessment & Plan:  Tyler Duran comes in today with chief complaint of Medical Management of Chronic Issues   Diagnosis and orders addressed:  1. Essential hypertension Low sodium diet -  CMP14+EGFR - lisinopril-hydrochlorothiazide (PRINZIDE,ZESTORETIC) 10-12.5 MG tablet; Take 1 tablet by mouth daily.  Dispense: 90 tablet; Refill: 1  2. Hypothyroidism, unspecified type - levothyroxine (SYNTHROID, LEVOTHROID) 125 MCG tablet; Take 1 tablet (125 mcg total) by mouth daily before breakfast.  Dispense: 90 tablet; Refill: 1  3. Type 2 diabetes mellitus without complication, without long-term current use of insulin (HCC) Continue carbs - Bayer DCA Hb A1c Waived - glimepiride (AMARYL) 4 MG tablet; TAKE 1 TABLET(4 MG) BY MOUTH DAILY WITH BREAKFAST  Dispense: 90 tablet; Refill: 1 - metFORMIN (GLUCOPHAGE) 1000 MG tablet; Take 1 tablet (1,000 mg total) by mouth 2 (two) times daily with a meal.  Dispense: 180 tablet; Refill: 1  4. Morbid obesity ([place]) Discussed diet and exercise for person with BMI >25 Will recheck weight in 3-6 months  5. Hyperlipidemia with target LDL less than 100 Low fat diet - Lipid panel - atorvastatin (LIPITOR) 40 MG tablet; TAKE 1 TABLET (40 MG TOTAL) BY MOUTH DAILY.  Dispense: 90 tablet; Refill: 1  Labs pending Health Maintenance reviewed Diet and exercise encouraged  Follow up plan: 3 months   Mary-Margaret Hassell Done, FNP

## 2018-05-27 LAB — CMP14+EGFR
ALK PHOS: 92 IU/L (ref 39–117)
ALT: 32 IU/L (ref 0–44)
AST: 19 IU/L (ref 0–40)
Albumin/Globulin Ratio: 2.1 (ref 1.2–2.2)
Albumin: 4.6 g/dL (ref 3.5–5.5)
BUN / CREAT RATIO: 14 (ref 9–20)
BUN: 15 mg/dL (ref 6–24)
Bilirubin Total: 0.4 mg/dL (ref 0.0–1.2)
CALCIUM: 9.3 mg/dL (ref 8.7–10.2)
CO2: 26 mmol/L (ref 20–29)
CREATININE: 1.08 mg/dL (ref 0.76–1.27)
Chloride: 100 mmol/L (ref 96–106)
GFR calc Af Amer: 93 mL/min/{1.73_m2} (ref >59)
GFR, EST NON AFRICAN AMERICAN: 80 mL/min/{1.73_m2} (ref >59)
GLUCOSE: 130 mg/dL — AB (ref 65–99)
Globulin, Total: 2.2 g/dL (ref 1.5–4.5)
Potassium: 4 mmol/L (ref 3.5–5.2)
SODIUM: 141 mmol/L (ref 134–144)
Total Protein: 6.8 g/dL (ref 6.0–8.5)

## 2018-05-27 LAB — LIPID PANEL
CHOL/HDL RATIO: 4 ratio (ref 0.0–5.0)
CHOLESTEROL TOTAL: 129 mg/dL (ref 100–199)
HDL: 32 mg/dL — ABNORMAL LOW (ref >39)
LDL CALC: 64 mg/dL (ref 0–99)
TRIGLYCERIDES: 163 mg/dL — AB (ref 0–149)
VLDL CHOLESTEROL CAL: 33 mg/dL (ref 5–40)

## 2018-07-22 ENCOUNTER — Other Ambulatory Visit: Payer: Self-pay | Admitting: Nurse Practitioner

## 2018-07-22 DIAGNOSIS — E119 Type 2 diabetes mellitus without complications: Secondary | ICD-10-CM

## 2018-08-29 ENCOUNTER — Encounter: Payer: Self-pay | Admitting: Nurse Practitioner

## 2018-08-29 ENCOUNTER — Ambulatory Visit: Payer: BLUE CROSS/BLUE SHIELD | Admitting: Nurse Practitioner

## 2018-08-29 ENCOUNTER — Other Ambulatory Visit: Payer: Self-pay

## 2018-08-29 VITALS — BP 122/82 | HR 90 | Temp 98.2°F | Ht 73.0 in | Wt 270.0 lb

## 2018-08-29 DIAGNOSIS — I10 Essential (primary) hypertension: Secondary | ICD-10-CM

## 2018-08-29 DIAGNOSIS — E119 Type 2 diabetes mellitus without complications: Secondary | ICD-10-CM

## 2018-08-29 DIAGNOSIS — E785 Hyperlipidemia, unspecified: Secondary | ICD-10-CM | POA: Diagnosis not present

## 2018-08-29 DIAGNOSIS — E039 Hypothyroidism, unspecified: Secondary | ICD-10-CM

## 2018-08-29 LAB — BAYER DCA HB A1C WAIVED: HB A1C (BAYER DCA - WAIVED): 7.5 % — ABNORMAL HIGH (ref <7.0)

## 2018-08-29 MED ORDER — LEVOTHYROXINE SODIUM 125 MCG PO TABS: 125.0000 ug | ORAL_TABLET | Freq: Every day | ORAL | 1 refills | Status: DC

## 2018-08-29 MED ORDER — GLIMEPIRIDE 4 MG PO TABS: 4.0000 mg | ORAL_TABLET | Freq: Every day | ORAL | 1 refills | Status: DC

## 2018-08-29 MED ORDER — ATORVASTATIN CALCIUM 40 MG PO TABS: ORAL_TABLET | ORAL | 1 refills | Status: DC

## 2018-08-29 MED ORDER — LISINOPRIL-HYDROCHLOROTHIAZIDE 10-12.5 MG PO TABS: 1.0000 | ORAL_TABLET | Freq: Every day | ORAL | 1 refills | Status: DC

## 2018-08-29 NOTE — Patient Instructions (Signed)
Diabetes Mellitus and Foot Care  Foot care is an important part of your health, especially when you have diabetes. Diabetes may cause you to have problems because of poor blood flow (circulation) to your feet and legs, which can cause your skin to:   Become thinner and drier.   Break more easily.   Heal more slowly.   Peel and crack.  You may also have nerve damage (neuropathy) in your legs and feet, causing decreased feeling in them. This means that you may not notice minor injuries to your feet that could lead to more serious problems. Noticing and addressing any potential problems early is the best way to prevent future foot problems.  How to care for your feet  Foot hygiene   Wash your feet daily with warm water and mild soap. Do not use hot water. Then, pat your feet and the areas between your toes until they are completely dry. Do not soak your feet as this can dry your skin.   Trim your toenails straight across. Do not dig under them or around the cuticle. File the edges of your nails with an emery board or nail file.   Apply a moisturizing lotion or petroleum jelly to the skin on your feet and to dry, brittle toenails. Use lotion that does not contain alcohol and is unscented. Do not apply lotion between your toes.  Shoes and socks   Wear clean socks or stockings every day. Make sure they are not too tight. Do not wear knee-high stockings since they may decrease blood flow to your legs.   Wear shoes that fit properly and have enough cushioning. Always look in your shoes before you put them on to be sure there are no objects inside.   To break in new shoes, wear them for just a few hours a day. This prevents injuries on your feet.  Wounds, scrapes, corns, and calluses   Check your feet daily for blisters, cuts, bruises, sores, and redness. If you cannot see the bottom of your feet, use a mirror or ask someone for help.   Do not cut corns or calluses or try to remove them with medicine.   If you  find a minor scrape, cut, or break in the skin on your feet, keep it and the skin around it clean and dry. You may clean these areas with mild soap and water. Do not clean the area with peroxide, alcohol, or iodine.   If you have a wound, scrape, corn, or callus on your foot, look at it several times a day to make sure it is healing and not infected. Check for:  ? Redness, swelling, or pain.  ? Fluid or blood.  ? Warmth.  ? Pus or a bad smell.  General instructions   Do not cross your legs. This may decrease blood flow to your feet.   Do not use heating pads or hot water bottles on your feet. They may burn your skin. If you have lost feeling in your feet or legs, you may not know this is happening until it is too late.   Protect your feet from hot and cold by wearing shoes, such as at the beach or on hot pavement.   Schedule a complete foot exam at least once a year (annually) or more often if you have foot problems. If you have foot problems, report any cuts, sores, or bruises to your health care provider immediately.  Contact a health care provider if:     You have a medical condition that increases your risk of infection and you have any cuts, sores, or bruises on your feet.   You have an injury that is not healing.   You have redness on your legs or feet.   You feel burning or tingling in your legs or feet.   You have pain or cramps in your legs and feet.   Your legs or feet are numb.   Your feet always feel cold.   You have pain around a toenail.  Get help right away if:   You have a wound, scrape, corn, or callus on your foot and:  ? You have pain, swelling, or redness that gets worse.  ? You have fluid or blood coming from the wound, scrape, corn, or callus.  ? Your wound, scrape, corn, or callus feels warm to the touch.  ? You have pus or a bad smell coming from the wound, scrape, corn, or callus.  ? You have a fever.  ? You have a red line going up your leg.  Summary   Check your feet every day  for cuts, sores, red spots, swelling, and blisters.   Moisturize feet and legs daily.   Wear shoes that fit properly and have enough cushioning.   If you have foot problems, report any cuts, sores, or bruises to your health care provider immediately.   Schedule a complete foot exam at least once a year (annually) or more often if you have foot problems.  This information is not intended to replace advice given to you by your health care provider. Make sure you discuss any questions you have with your health care provider.  Document Released: 05/22/2000 Document Revised: 07/07/2017 Document Reviewed: 06/26/2016  [employer]vier Interactive Patient Education  2019 [employer]

## 2018-08-29 NOTE — Progress Notes (Signed)
Subjective:    Patient ID: Tyler Duran, male    DOB: 11/03/1970, 48 y.o.   MRN: [id number]   Chief Complaint: medical management of chronic issues  HPI:  1. Essential hypertension  No c/o chest pain, sob and headaches. Does not check blood pressure at home. BP Readings from Last 3 Encounters:  08/29/18 122/82  05/26/18 128/68  02/18/18 119/80     2. Type 2 diabetes mellitus without complication, without long-term current use of insulin ([facility]) last HGBA1C 7.1. his blood sugars have running around 120-140. No hypoglycemia.  3. Hypothyroidism, unspecified type  No problems that he is aware of.  4. Hyperlipidemia with target LDL less than 100  Doe stry to watch diet. No dedicated exercise.  5. Morbid obesity ([place])  Weight is unchanged    Outpatient Encounter Medications as of 08/29/2018  Medication Sig  . atorvastatin (LIPITOR) 40 MG tablet TAKE 1 TABLET (40 MG TOTAL) BY MOUTH DAILY.  Marland Kitchen glimepiride (AMARYL) 4 MG tablet TAKE 1 TABLET(4 MG) BY MOUTH DAILY WITH BREAKFAST  . glucose blood (ONETOUCH VERIO) test strip Check blood sugar every morning fasting and prn  Dx 250.02  . indomethacin (INDOCIN) 50 MG capsule Take 1 capsule (50 mg total) by mouth 3 (three) times daily with meals as needed.  Marland Kitchen levothyroxine (SYNTHROID, LEVOTHROID) 125 MCG tablet Take 1 tablet (125 mcg total) by mouth daily before breakfast.  . lisinopril-hydrochlorothiazide (PRINZIDE,ZESTORETIC) 10-12.5 MG tablet Take 1 tablet by mouth daily.  . metFORMIN (GLUCOPHAGE) 1000 MG tablet Take 1 tablet (1,000 mg total) by mouth 2 (two) times daily with a meal.     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   Review of Systems  Constitutional: Negative for activity change and appetite change.  HENT: Negative.   Eyes: Negative for pain.  Respiratory: Negative for shortness of breath.   Cardiovascular: Negative for chest pain, palpitations and leg  swelling.  Gastrointestinal: Negative for abdominal pain.  Endocrine: Negative for polydipsia.  Genitourinary: Negative.   Skin: Negative for rash.  Neurological: Negative for dizziness, weakness and headaches.  Hematological: Does not bruise/bleed easily.  Psychiatric/Behavioral: Negative.   All other systems reviewed and are negative.      Objective:   Physical Exam Vitals signs and nursing note reviewed.  Constitutional:      Appearance: Normal appearance. He is well-developed.  HENT:     Head: Normocephalic.     Nose: Nose normal.  Eyes:     Pupils: Pupils are equal, round, and reactive to light.  Neck:     Musculoskeletal: Normal range of motion and neck supple.     Thyroid: No thyroid mass or thyromegaly.     Vascular: No carotid bruit or JVD.     Trachea: Phonation normal.  Cardiovascular:     Rate and Rhythm: Normal rate and regular rhythm.  Pulmonary:     Effort: Pulmonary effort is normal. No respiratory distress.     Breath sounds: Normal breath sounds.  Abdominal:     General: Bowel sounds are normal.     Palpations: Abdomen is soft.     Tenderness: There is no abdominal tenderness.  Musculoskeletal: Normal range of motion.  Lymphadenopathy:     Cervical: No cervical adenopathy.  Skin:    General: Skin is warm and dry.  Neurological:     Mental Status: He is alert and oriented to person, place, and time.  Psychiatric:  Behavior: Behavior normal.        Thought Content: Thought content normal.        Judgment: Judgment normal.    BP 122/82   Pulse 90   Temp 98.2 F (36.8 C) (Oral)   Ht _0  (1.854 m)   Wt 270 lb (122.5 kg)   BMI 35.62 kg/m         Assessment & Plan:  Tyler Duran comes in today with chief complaint of No chief complaint on file.   Diagnosis and orders addressed:  1. Essential hypertension Low sodium diet - CMP14+EGFR - lisinopril-hydrochlorothiazide (PRINZIDE,ZESTORETIC) 10-12.5 MG tablet; Take 1 tablet by  mouth daily.  Dispense: 90 tablet; Refill: 1  2. Type 2 diabetes mellitus without complication, without long-term current use of insulin ([facility]) Carb counting - Bayer DCA Hb A1c Waived - glimepiride (AMARYL) 4 MG tablet; Take 1 tablet (4 mg total) by mouth daily with breakfast.  Dispense: 90 tablet; Refill: 1  3. Acquired hypothyroidism* - levothyroxine (SYNTHROID, LEVOTHROID) 125 MCG tablet; Take 1 tablet (125 mcg total) by mouth daily before breakfast.  Dispense: 90 tablet; Refill: 1   4. Hyperlipidemia with target LDL less than 100 Low fat diet - Lipid panel - atorvastatin (LIPITOR) 40 MG tablet; TAKE 1 TABLET (40 MG TOTAL) BY MOUTH DAILY.  Dispense: 90 tablet; Refill: 1  5. Morbid obesity ([place]) Discussed diet and exercise for person with BMI >25 Will recheck weight in 3-6 months   Labs pending Health Maintenance reviewed Diet and exercise encouraged  Follow up plan: 3 month   [place], FNP

## 2018-08-30 LAB — CMP14+EGFR
ALBUMIN: 4.2 g/dL (ref 4.0–5.0)
ALT: 27 IU/L (ref 0–44)
AST: 16 IU/L (ref 0–40)
Albumin/Globulin Ratio: 1.6 (ref 1.2–2.2)
Alkaline Phosphatase: 98 IU/L (ref 39–117)
BUN / CREAT RATIO: 18 (ref 9–20)
BUN: 17 mg/dL (ref 6–24)
Bilirubin Total: 0.4 mg/dL (ref 0.0–1.2)
CO2: 22 mmol/L (ref 20–29)
Calcium: 9.6 mg/dL (ref 8.7–10.2)
Chloride: 103 mmol/L (ref 96–106)
Creatinine, Ser: 0.96 mg/dL (ref 0.76–1.27)
GFR calc Af Amer: 107 mL/min/{1.73_m2} (ref >59)
GFR calc non Af Amer: 92 mL/min/{1.73_m2} (ref >59)
Globulin, Total: 2.6 g/dL (ref 1.5–4.5)
Glucose: 162 mg/dL — ABNORMAL HIGH (ref 65–99)
Potassium: 4.3 mmol/L (ref 3.5–5.2)
Sodium: 141 mmol/L (ref 134–144)
Total Protein: 6.8 g/dL (ref 6.0–8.5)

## 2018-08-30 LAB — LIPID PANEL
Chol/HDL Ratio: 5 ratio (ref 0.0–5.0)
Cholesterol, Total: 145 mg/dL (ref 100–199)
HDL: 29 mg/dL — ABNORMAL LOW (ref >39)
LDL Calculated: 51 mg/dL (ref 0–99)
Triglycerides: 326 mg/dL — ABNORMAL HIGH (ref 0–149)
VLDL Cholesterol Cal: 65 mg/dL — ABNORMAL HIGH (ref 5–40)

## 2018-12-12 ENCOUNTER — Telehealth: Payer: Self-pay | Admitting: Nurse Practitioner

## 2018-12-12 ENCOUNTER — Other Ambulatory Visit: Payer: Self-pay

## 2018-12-13 ENCOUNTER — Encounter: Payer: Self-pay | Admitting: Nurse Practitioner

## 2018-12-13 ENCOUNTER — Ambulatory Visit: Payer: BC Managed Care – PPO | Admitting: Nurse Practitioner

## 2018-12-13 VITALS — BP 110/73 | HR 76 | Temp 97.9°F | Ht 73.0 in | Wt 271.0 lb

## 2018-12-13 DIAGNOSIS — E785 Hyperlipidemia, unspecified: Secondary | ICD-10-CM | POA: Diagnosis not present

## 2018-12-13 DIAGNOSIS — E039 Hypothyroidism, unspecified: Secondary | ICD-10-CM

## 2018-12-13 DIAGNOSIS — E119 Type 2 diabetes mellitus without complications: Secondary | ICD-10-CM

## 2018-12-13 DIAGNOSIS — I10 Essential (primary) hypertension: Secondary | ICD-10-CM | POA: Diagnosis not present

## 2018-12-13 LAB — BAYER DCA HB A1C WAIVED: HB A1C (BAYER DCA - WAIVED): 6.9 % (ref <7.0)

## 2018-12-13 MED ORDER — LEVOTHYROXINE SODIUM 125 MCG PO TABS: 125.0000 ug | ORAL_TABLET | Freq: Every day | ORAL | 1 refills | Status: DC

## 2018-12-13 MED ORDER — METFORMIN HCL 1000 MG PO TABS: 1000.0000 mg | ORAL_TABLET | Freq: Two times a day (BID) | ORAL | 1 refills | Status: DC

## 2018-12-13 MED ORDER — ATORVASTATIN CALCIUM 40 MG PO TABS: ORAL_TABLET | ORAL | 1 refills | Status: DC

## 2018-12-13 MED ORDER — GLIMEPIRIDE 4 MG PO TABS: 4.0000 mg | ORAL_TABLET | Freq: Every day | ORAL | 1 refills | Status: DC

## 2018-12-13 MED ORDER — LISINOPRIL-HYDROCHLOROTHIAZIDE 10-12.5 MG PO TABS: 1.0000 | ORAL_TABLET | Freq: Every day | ORAL | 1 refills | Status: DC

## 2018-12-13 NOTE — Patient Instructions (Signed)
Diabetes Mellitus and Foot Care Foot care is an important part of your health, especially when you have diabetes. Diabetes may cause you to have problems because of poor blood flow (circulation) to your feet and legs, which can cause your skin to:  Become thinner and drier.  Break more easily.  Heal more slowly.  Peel and crack. You may also have nerve damage (neuropathy) in your legs and feet, causing decreased feeling in them. This means that you may not notice minor injuries to your feet that could lead to more serious problems. Noticing and addressing any potential problems early is the best way to prevent future foot problems. How to care for your feet Foot hygiene  Wash your feet daily with warm water and mild soap. Do not use hot water. Then, pat your feet and the areas between your toes until they are completely dry. Do not soak your feet as this can dry your skin.  Trim your toenails straight across. Do not dig under them or around the cuticle. File the edges of your nails with an emery board or nail file.  Apply a moisturizing lotion or petroleum jelly to the skin on your feet and to dry, brittle toenails. Use lotion that does not contain alcohol and is unscented. Do not apply lotion between your toes. Shoes and socks  Wear clean socks or stockings every day. Make sure they are not too tight. Do not wear knee-high stockings since they may decrease blood flow to your legs.  Wear shoes that fit properly and have enough cushioning. Always look in your shoes before you put them on to be sure there are no objects inside.  To break in new shoes, wear them for just a few hours a day. This prevents injuries on your feet. Wounds, scrapes, corns, and calluses  Check your feet daily for blisters, cuts, bruises, sores, and redness. If you cannot see the bottom of your feet, use a mirror or ask someone for help.  Do not cut corns or calluses or try to remove them with medicine.  If you  find a minor scrape, cut, or break in the skin on your feet, keep it and the skin around it clean and dry. You may clean these areas with mild soap and water. Do not clean the area with peroxide, alcohol, or iodine.  If you have a wound, scrape, corn, or callus on your foot, look at it several times a day to make sure it is healing and not infected. Check for: ? Redness, swelling, or pain. ? Fluid or blood. ? Warmth. ? Pus or a bad smell. General instructions  Do not cross your legs. This may decrease blood flow to your feet.  Do not use heating pads or hot water bottles on your feet. They may burn your skin. If you have lost feeling in your feet or legs, you may not know this is happening until it is too late.  Protect your feet from hot and cold by wearing shoes, such as at the beach or on hot pavement.  Schedule a complete foot exam at least once a year (annually) or more often if you have foot problems. If you have foot problems, report any cuts, sores, or bruises to your health care provider immediately. Contact a health care provider if:  You have a medical condition that increases your risk of infection and you have any cuts, sores, or bruises on your feet.  You have an injury that is not   healing.  You have redness on your legs or feet.  You feel burning or tingling in your legs or feet.  You have pain or cramps in your legs and feet.  Your legs or feet are numb.  Your feet always feel cold.  You have pain around a toenail. Get help right away if:  You have a wound, scrape, corn, or callus on your foot and: ? You have pain, swelling, or redness that gets worse. ? You have fluid or blood coming from the wound, scrape, corn, or callus. ? Your wound, scrape, corn, or callus feels warm to the touch. ? You have pus or a bad smell coming from the wound, scrape, corn, or callus. ? You have a fever. ? You have a red line going up your leg. Summary  Check your feet every day  for cuts, sores, red spots, swelling, and blisters.  Moisturize feet and legs daily.  Wear shoes that fit properly and have enough cushioning.  If you have foot problems, report any cuts, sores, or bruises to your health care provider immediately.  Schedule a complete foot exam at least once a year (annually) or more often if you have foot problems. This information is not intended to replace advice given to you by your health care provider. Make sure you discuss any questions you have with your health care provider. Document Released: 05/22/2000 Document Revised: 07/07/2017 Document Reviewed: 06/26/2016 Elsevier Patient Education  2020 Elsevier Inc.  

## 2018-12-13 NOTE — Progress Notes (Signed)
Subjective:    Patient ID: Tyler Duran, male    DOB: April 19, 1971, 48 y.o.   MRN: [id number]   Chief Complaint: Medical Management of Chronic Issues    HPI:  1. Essential hypertension No c/o chest pain, sob or headache. Does not check blood pressure at home. BP Readings from Last 3 Encounters:  08/29/18 122/82  05/26/18 128/68  02/18/18 119/80     2. 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 Does very little exercise.  3. Type 2 diabetes mellitus without complication, without long-term current use of insulin ([facility]) Last hgba1c was 7.5%. his blood sugars are up and down. He denies any real low blood sugars  4. Hypothyroidism, unspecified type No problems that aware of  5. Morbid obesity ([place]) No recent weight changes    Outpatient Encounter Medications as of 12/13/2018  Medication Sig  . atorvastatin (LIPITOR) 40 MG tablet TAKE 1 TABLET (40 MG TOTAL) BY MOUTH DAILY.  Marland Kitchen glimepiride (AMARYL) 4 MG tablet Take 1 tablet (4 mg total) by mouth daily with breakfast.  . glucose blood (ONETOUCH VERIO) test strip Check blood sugar every morning fasting and prn  Dx 250.02  . indomethacin (INDOCIN) 50 MG capsule Take 1 capsule (50 mg total) by mouth 3 (three) times daily with meals as needed. (Patient not taking: Reported on 08/29/2018)  . levothyroxine (SYNTHROID, LEVOTHROID) 125 MCG tablet Take 1 tablet (125 mcg total) by mouth daily before breakfast.  . lisinopril-hydrochlorothiazide (PRINZIDE,ZESTORETIC) 10-12.5 MG tablet Take 1 tablet by mouth daily.  . metFORMIN (GLUCOPHAGE) 1000 MG tablet Take 1 tablet (1,000 mg total) by mouth 2 (two) times daily with a meal.     Past Surgical History:  Procedure Laterality Date  . APPENDECTOMY    . CHOLECYSTECTOMY N/A 07/21/2013   Procedure: LAPAROSCOPIC CHOLECYSTECTOMY;  Surgeon: Jamesetta So, MD;  Location: [facility];  Service:  General;  Laterality: N/A;    Family History  Problem Relation Age of Onset  . Cancer Mother   . 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  HENT: Negative.   Eyes: Negative for pain.  Respiratory: Negative for shortness of breath.   Cardiovascular: Negative for chest pain, palpitations and leg swelling.  Gastrointestinal: Negative for abdominal pain.  Endocrine: Negative for polydipsia.  Genitourinary: Negative.   Skin: Negative for rash.  Neurological: Negative for dizziness, weakness and headaches.  Hematological: Does not bruise/bleed easily.  Psychiatric/Behavioral: Negative.   All other systems reviewed and are negative.      Objective:   Physical Exam Vitals signs and nursing note reviewed.  Constitutional:      Appearance: Normal appearance. He is well-developed.  HENT:     Head: Normocephalic.     Nose: Nose normal.  Eyes:     Pupils: Pupils are equal, round, and reactive to light.  Neck:     Musculoskeletal: Normal range of motion and neck supple.     Thyroid: No thyroid mass or thyromegaly.     Vascular: No carotid bruit or JVD.     Trachea: Phonation normal.  Cardiovascular:     Rate and Rhythm: Normal rate and regular rhythm.  Pulmonary:     Effort: Pulmonary effort is  normal. No respiratory distress.     Breath sounds: Normal breath sounds.  Abdominal:     General: Bowel sounds are normal.     Palpations: Abdomen is soft.     Tenderness: There is no abdominal tenderness.  Musculoskeletal: Normal range of motion.  Lymphadenopathy:     Cervical: No cervical adenopathy.  Skin:    General: Skin is warm and dry.  Neurological:     Mental Status: He is alert and oriented to person, place, and time.  Psychiatric:        Behavior: Behavior normal.        Thought Content:  Thought content normal.        Judgment: Judgment normal.       BP 110/73   Pulse 76   Temp 97.9 F (36.6 C) (Oral)   Ht '6\' 1"'$  (1.854 m)   Wt 271 lb (122.9 kg)   BMI 35.75 kg/m   Hgba1c 6.9%    Assessment & Plan:  Tyler Duran comes in today with chief complaint of Medical Management of Chronic Issues   Diagnosis and orders addressed:  1. Essential hypertension Low sodium diet - CMP14+EGFR - lisinopril-hydrochlorothiazide (ZESTORETIC) 10-12.5 MG tablet; Take 1 tablet by mouth daily.  Dispense: 90 tablet; Refill: 1  2. Hyperlipidemia with target LDL less than 100 Low fat diet - Lipid panel - atorvastatin (LIPITOR) 40 MG tablet; TAKE 1 TABLET (40 MG TOTAL) BY MOUTH DAILY.  Dispense: 90 tablet; Refill: 1  3. Type 2 diabetes mellitus without complication, without long-term current use of insulin ([facility]) Continue to watch carbs in diet - Bayer DCA Hb A1c Waived - glimepiride (AMARYL) 4 MG tablet; Take 1 tablet (4 mg total) by mouth daily with breakfast.  Dispense: 90 tablet; Refill: 1 - metFORMIN (GLUCOPHAGE) 1000 MG tablet; Take 1 tablet (1,000 mg total) by mouth 2 (two) times daily with a meal.  Dispense: 180 tablet; Refill: 1  4. Acquired hypothyroidism - levothyroxine (SYNTHROID) 125 MCG tablet; Take 1 tablet (125 mcg total) by mouth daily before breakfast.  Dispense: 90 tablet; Refill: 1  5. Morbid obesity ([place]) Discussed diet and exercise for person with BMI >25 Will recheck weight in 3-6 months  6. Labs pending Health Maintenance reviewed Diet and exercise encouraged  Follow up plan: 3 months   Mary-Margaret Hassell Done, FNP

## 2018-12-14 LAB — CMP14+EGFR
ALT: 31 IU/L (ref 0–44)
AST: 20 IU/L (ref 0–40)
Albumin/Globulin Ratio: 2.1 (ref 1.2–2.2)
Albumin: 4.6 g/dL (ref 4.0–5.0)
Alkaline Phosphatase: 90 IU/L (ref 39–117)
BUN/Creatinine Ratio: 15 (ref 9–20)
BUN: 18 mg/dL (ref 6–24)
Bilirubin Total: 0.4 mg/dL (ref 0.0–1.2)
CO2: 23 mmol/L (ref 20–29)
Calcium: 9.6 mg/dL (ref 8.7–10.2)
Chloride: 103 mmol/L (ref 96–106)
Creatinine, Ser: 1.19 mg/dL (ref 0.76–1.27)
GFR calc Af Amer: 82 mL/min/{1.73_m2} (ref >59)
GFR calc non Af Amer: 71 mL/min/{1.73_m2} (ref >59)
Globulin, Total: 2.2 g/dL (ref 1.5–4.5)
Glucose: 147 mg/dL — ABNORMAL HIGH (ref 65–99)
Potassium: 4.4 mmol/L (ref 3.5–5.2)
Sodium: 143 mmol/L (ref 134–144)
Total Protein: 6.8 g/dL (ref 6.0–8.5)

## 2018-12-14 LAB — LIPID PANEL
Chol/HDL Ratio: 3.7 ratio (ref 0.0–5.0)
Cholesterol, Total: 123 mg/dL (ref 100–199)
HDL: 33 mg/dL — ABNORMAL LOW (ref >39)
LDL Calculated: 49 mg/dL (ref 0–99)
Triglycerides: 203 mg/dL — ABNORMAL HIGH (ref 0–149)
VLDL Cholesterol Cal: 41 mg/dL — ABNORMAL HIGH (ref 5–40)

## 2019-01-03 ENCOUNTER — Other Ambulatory Visit: Payer: Self-pay | Admitting: Nurse Practitioner

## 2019-01-03 DIAGNOSIS — E119 Type 2 diabetes mellitus without complications: Secondary | ICD-10-CM

## 2019-01-26 ENCOUNTER — Other Ambulatory Visit: Payer: Self-pay

## 2019-01-30 ENCOUNTER — Other Ambulatory Visit: Payer: Self-pay

## 2019-01-31 ENCOUNTER — Ambulatory Visit: Payer: BC Managed Care – PPO | Admitting: Nurse Practitioner

## 2019-01-31 ENCOUNTER — Encounter: Payer: Self-pay | Admitting: Nurse Practitioner

## 2019-01-31 VITALS — BP 122/72 | HR 75 | Temp 98.9°F | Ht 73.0 in | Wt 266.0 lb

## 2019-01-31 DIAGNOSIS — R361 Hematospermia: Secondary | ICD-10-CM

## 2019-01-31 DIAGNOSIS — Z1211 Encounter for screening for malignant neoplasm of colon: Secondary | ICD-10-CM | POA: Diagnosis not present

## 2019-01-31 NOTE — Progress Notes (Signed)
   Subjective:    Patient ID: Tyler Duran, male    DOB: Jan 10, 1971, 48 y.o.   MRN: [id number]   Chief Complaint: Blood in [place]   HPI Patient comes in today c/o blood in his semen. He noticed it first back in July. It has been intermitted. Last week he said the blodd was really dark. He feels fine. Denies any pain, no dysuria, no penile discharge and no blood seen in his urine. Denies any new sexual partners. Urine stream stops and goes at the end of urinating.     Review of Systems  Constitutional: Negative for activity change and appetite change.  HENT: Negative.   Eyes: Negative for pain.  Respiratory: Negative for shortness of breath.   Cardiovascular: Negative for chest pain, palpitations and leg swelling.  Gastrointestinal: Negative for abdominal pain.  Endocrine: Negative for polydipsia.  Genitourinary: Negative.   Skin: Negative for rash.  Neurological: Negative for dizziness, weakness and headaches.  Hematological: Does not bruise/bleed easily.  Psychiatric/Behavioral: Negative.   All other systems reviewed and are negative.      Objective:   Physical Exam Vitals signs and nursing note reviewed.  Constitutional:      Appearance: Normal appearance.  Cardiovascular:     Rate and Rhythm: Normal rate and regular rhythm.     Pulses: Normal pulses.     Heart sounds: Normal heart sounds.  Pulmonary:     Effort: Pulmonary effort is normal.  Skin:    General: Skin is warm.  Neurological:     General: No focal deficit present.     Mental Status: He is alert and oriented to person, place, and time.  Psychiatric:        Mood and Affect: Mood normal.        Behavior: Behavior normal.    BP 122/72   Pulse 75   Temp 98.9 F (37.2 C) (Oral)   Ht 6\' 1"  (1.854 m)   Wt 266 lb (120.7 kg)   BMI 35.09 kg/m         Assessment & Plan:  Tyler Duran in today with chief complaint of Blood in [place]   1. Blood in semen - PSA, total and free - Ambulatory  referral to Urology  2. Encounter for screening colonoscopy - Ambulatory referral to Gastroenterology  [place], FNP

## 2019-02-01 LAB — PSA, TOTAL AND FREE
PSA, Free Pct: 26 %
PSA, Free: 0.13 ng/mL
Prostate Specific Ag, Serum: 0.5 ng/mL (ref 0.0–4.0)

## 2019-02-02 ENCOUNTER — Encounter: Payer: Self-pay | Admitting: Gastroenterology

## 2019-02-15 ENCOUNTER — Telehealth: Payer: Self-pay | Admitting: Nurse Practitioner

## 2019-02-18 ENCOUNTER — Other Ambulatory Visit: Payer: Self-pay | Admitting: Nurse Practitioner

## 2019-02-18 DIAGNOSIS — E119 Type 2 diabetes mellitus without complications: Secondary | ICD-10-CM

## 2019-02-20 NOTE — Telephone Encounter (Signed)
Patient's ref is in review at [facility].

## 2019-02-27 ENCOUNTER — Ambulatory Visit (AMBULATORY_SURGERY_CENTER): Payer: Self-pay | Admitting: *Deleted

## 2019-02-27 ENCOUNTER — Other Ambulatory Visit: Payer: Self-pay

## 2019-02-27 ENCOUNTER — Encounter: Payer: Self-pay | Admitting: Gastroenterology

## 2019-02-27 VITALS — Temp 97.1°F | Ht 72.0 in | Wt 270.0 lb

## 2019-02-27 DIAGNOSIS — Z1211 Encounter for screening for malignant neoplasm of colon: Secondary | ICD-10-CM

## 2019-02-27 MED ORDER — PEG 3350-KCL-NA BICARB-NACL 420 G PO SOLR: 4000.0000 mL | Freq: Once | ORAL | 0 refills | Status: AC

## 2019-02-27 NOTE — Progress Notes (Signed)

## 2019-02-28 NOTE — Telephone Encounter (Signed)
Multiple attempts made to contact patient.  This encounter will now be closed  

## 2019-03-08 ENCOUNTER — Telehealth: Payer: Self-pay | Admitting: Gastroenterology

## 2019-03-08 NOTE — Telephone Encounter (Signed)
Do you now or have you had a fever in the last 14 days?     No °  °Do you have any respiratory symptoms of shortness of breath or cough now or in the last 14 days?    No °  °Do you have any family members or close contacts with diagnosed or suspected Covid-19 in the past 14 days?  No °  °Have you been tested for Covid-19 and found to be positive?    No ° °

## 2019-03-09 ENCOUNTER — Other Ambulatory Visit: Payer: Self-pay

## 2019-03-09 ENCOUNTER — Ambulatory Visit: Payer: BC Managed Care – PPO | Admitting: Gastroenterology

## 2019-03-09 ENCOUNTER — Encounter: Payer: Self-pay | Admitting: Gastroenterology

## 2019-03-09 VITALS — BP 95/52 | HR 77 | Temp 97.1°F | Resp 15 | Ht 78.0 in | Wt 270.0 lb

## 2019-03-09 DIAGNOSIS — Z1211 Encounter for screening for malignant neoplasm of colon: Secondary | ICD-10-CM | POA: Diagnosis not present

## 2019-03-09 DIAGNOSIS — D123 Benign neoplasm of transverse colon: Secondary | ICD-10-CM

## 2019-03-09 DIAGNOSIS — K635 Polyp of colon: Secondary | ICD-10-CM | POA: Diagnosis not present

## 2019-03-09 DIAGNOSIS — D124 Benign neoplasm of descending colon: Secondary | ICD-10-CM

## 2019-03-09 DIAGNOSIS — D125 Benign neoplasm of sigmoid colon: Secondary | ICD-10-CM

## 2019-03-09 MED ORDER — SODIUM CHLORIDE 0.9 % IV SOLN: 500.0000 mL | Freq: Once | INTRAVENOUS | Status: DC

## 2019-03-09 NOTE — Patient Instructions (Signed)
Thank you for allowing [place] to care for you today!  Recommend adding FiberCon 1 tablet by mouth daily.  Resume other medications and previous diet today.  Return to your normal activities tomorrow.  Recommend repeat colonoscopy in 3 years.   YOU HAD AN ENDOSCOPIC PROCEDURE TODAY AT [facility]:   Refer to the procedure report that was given to you for any specific questions about what was found during the examination.  If the procedure report does not answer your questions, please call your gastroenterologist to clarify.  If you requested that your care partner not be given the details of your procedure findings, then the procedure report has been included in a sealed envelope for you to review at your convenience later.  YOU SHOULD EXPECT: Some feelings of bloating in the abdomen. Passage of more gas than usual.  Walking can help get rid of the air that was put into your GI tract during the procedure and reduce the bloating. If you had a lower endoscopy (such as a colonoscopy or flexible sigmoidoscopy) you may notice spotting of blood in your stool or on the toilet paper. If you underwent a bowel prep for your procedure, you may not have a normal bowel movement for a few days.  Please Note:  You might notice some irritation and congestion in your nose or some drainage.  This is from the oxygen used during your procedure.  There is no need for concern and it should clear up in a day or so.  SYMPTOMS TO REPORT IMMEDIATELY:   Following lower endoscopy (colonoscopy or flexible sigmoidoscopy):  Excessive amounts of blood in the stool  Significant tenderness or worsening of abdominal pains  Swelling of the abdomen that is new, acute  Fever of 100F or higher   For urgent or emergent issues, a gastroenterologist can be reached at any hour by calling [phone].   DIET:  We do recommend a small meal at first, but then you may proceed to your regular diet.  Drink plenty of  fluids but you should avoid alcoholic beverages for 24 hours.  ACTIVITY:  You should plan to take it easy for the rest of today and you should NOT DRIVE or use heavy machinery until tomorrow (because of the sedation medicines used during the test).    FOLLOW UP: Our staff will call the number listed on your records 48-72 hours following your procedure to check on you and address any questions or concerns that you may have regarding the information given to you following your procedure. If we do not reach you, we will leave a message.  We will attempt to reach you two times.  During this call, we will ask if you have developed any symptoms of COVID 19. If you develop any symptoms (ie: fever, flu-like symptoms, shortness of breath, cough etc.) before then, please call [phone].  If you test positive for Covid 19 in the 2 weeks post procedure, please call and report this information to [place].    If any biopsies were taken you will be contacted by phone or by letter within the next 1-3 weeks.  Please call us at [phone] if you have not heard about the biopsies in 3 weeks.    SIGNATURES/CONFIDENTIALITY: You and/or your care partner have signed paperwork which will be entered into your electronic medical record.  These signatures attest to the fact that that the information above on your After Visit Summary has been reviewed and is understood.  Full responsibility of the confidentiality of this discharge information lies with you and/or your care-partner.

## 2019-03-09 NOTE — Progress Notes (Signed)
PT taken to PACU. Monitors in place. VSS. Report given to RN. 

## 2019-03-09 NOTE — Progress Notes (Signed)
Reviewed med and surgical history with  Pt. No changes since previsit.

## 2019-03-09 NOTE — Op Note (Signed)
[name] Procedure Date: 03/09/2019 8:20 AM MRN: [id number] Endoscopist: Justice Britain , MD Age: 48 Referring MD:  Date of Birth: [birth year] Gender: Male Account #: [id number] Procedure:                Colonoscopy Indications:              Screening for colorectal malignant neoplasm, This                            is the patient's first colonoscopy Medicines:                Monitored Anesthesia Care Procedure:                Pre-Anesthesia Assessment:                           - Prior to the procedure, a History and Physical                            was performed, and patient medications and                            allergies were reviewed. The patient's tolerance of                            previous anesthesia was also reviewed. The risks                            and benefits of the procedure and the sedation                            options and risks were discussed with the patient.                            All questions were answered, and informed consent                            was obtained. Prior Anticoagulants: The patient has                            taken no previous anticoagulant or antiplatelet                            agents except for NSAID medication. ASA Grade                            Assessment: II - A patient with mild systemic                            disease. After reviewing the risks and benefits,                            the patient was deemed in satisfactory condition to  undergo the procedure.                           After obtaining informed consent, the colonoscope                            was passed under direct vision. Throughout the                            procedure, the patient's blood pressure, pulse, and                            oxygen saturations were monitored continuously. The                            Colonoscope was introduced through the anus  and                            advanced to the the cecum, identified by                            appendiceal orifice and ileocecal valve. The                            colonoscopy was performed without difficulty. The                            patient tolerated the procedure. The quality of the                            bowel preparation was adequate. The ileocecal                            valve, appendiceal orifice, and rectum were                            photographed. Scope In: 8:45:14 AM Scope Out: 9:10:36 AM Scope Withdrawal Time: 0 hours 20 minutes 3 seconds  Total Procedure Duration: 0 hours 25 minutes 22 seconds  Findings:                 The digital rectal exam findings include                            hemorrhoids. Pertinent negatives include no                            palpable rectal lesions.                           A moderate amount of semi-liquid stool was found in                            the entire colon, interfering with visualization.  Lavage of the area was performed using copious                            amounts, resulting in clearance with adequate                            visualization.                           The colon (entire examined portion) revealed                            moderately excessive looping. Advancing the scope                            required changing the patient's position, using                            manual pressure, straightening and shortening the                            scope to obtain bowel loop reduction and using                            scope torsion.                           Seven pedunculated and sessile polyps were found in                            the sigmoid colon (1), descending colon (2) and                            hepatic flexure (4). The polyps were 2 to 8 mm in                            size. These polyps were removed with a cold snare.                             Resection and retrieval were complete.                           Non-bleeding non-thrombosed internal hemorrhoids                            were found during retroflexion, during perianal                            exam and during digital exam. The hemorrhoids were                            Grade II (internal hemorrhoids that prolapse but                            reduce  spontaneously). Complications:            No immediate complications. Estimated Blood Loss:     Estimated blood loss was minimal. Impression:               - Hemorrhoids found on digital rectal exam.                           - Stool in the entire examined colon.                           - There was significant looping of the colon.                           - Seven, 2 to 8 mm polyps in the sigmoid colon, in                            the descending colon and at the hepatic flexure,                            removed with a cold snare. Resected and retrieved.                           - Non-bleeding non-thrombosed internal hemorrhoids. Recommendation:           - The patient will be observed post-procedure,                            until all discharge criteria are met.                           - Discharge patient to home.                           - Patient has a contact number available for                            emergencies. The signs and symptoms of potential                            delayed complications were discussed with the                            patient. Return to normal activities tomorrow.                            Written discharge instructions were provided to the                            patient.                           - High fiber diet.                           - Use FiberCon 1 tablet PO daily.                           -  Continue present medications.                           - Await pathology results.                           - Repeat colonoscopy in 3 years for surveillance.                            - The findings and recommendations were discussed                            with the patient. Justice Britain, MD 03/09/2019 9:19:13 AM

## 2019-03-09 NOTE — Progress Notes (Signed)
Called to room to assist during endoscopic procedure.  Patient ID and intended procedure confirmed with present staff. Received instructions for my participation in the procedure from the performing physician.  

## 2019-03-13 ENCOUNTER — Telehealth: Payer: Self-pay | Admitting: *Deleted

## 2019-03-13 NOTE — Telephone Encounter (Signed)
  Follow up Call-  Call back number 03/09/2019  Post procedure Call Back phone  # [phone]  Permission to leave phone message Yes  Some recent data might be hidden     Patient questions:  Do you have a fever, pain , or abdominal swelling? No. Pain Score  0 *  Have you tolerated food without any problems? Yes.    Have you been able to return to your normal activities? Yes.    Do you have any questions about your discharge instructions: Diet   No. Medications  No. Follow up visit  No.  Do you have questions or concerns about your Care? No.  Actions: * If pain score is 4 or above: No action needed, pain <4.  1. Have you developed a fever since your procedure? NO  2.   Have you had an respiratory symptoms (SOB or cough) since your procedure? NO  3.   Have you tested positive for COVID 19 since your procedure NO  4.   Have you had any family members/close contacts diagnosed with the COVID 19 since your procedure?  NO   If yes to any of these questions please route to Joylene John, RN and Alphonsa Gin, RN.

## 2019-03-16 ENCOUNTER — Ambulatory Visit: Payer: Self-pay | Admitting: Nurse Practitioner

## 2019-03-17 ENCOUNTER — Encounter: Payer: Self-pay | Admitting: Gastroenterology

## 2019-03-27 ENCOUNTER — Ambulatory Visit (INDEPENDENT_AMBULATORY_CARE_PROVIDER_SITE_OTHER): Payer: BC Managed Care – PPO | Admitting: Nurse Practitioner

## 2019-03-27 ENCOUNTER — Encounter: Payer: Self-pay | Admitting: Nurse Practitioner

## 2019-03-27 DIAGNOSIS — I10 Essential (primary) hypertension: Secondary | ICD-10-CM | POA: Diagnosis not present

## 2019-03-27 DIAGNOSIS — Z23 Encounter for immunization: Secondary | ICD-10-CM

## 2019-03-27 DIAGNOSIS — E039 Hypothyroidism, unspecified: Secondary | ICD-10-CM

## 2019-03-27 DIAGNOSIS — E1169 Type 2 diabetes mellitus with other specified complication: Secondary | ICD-10-CM

## 2019-03-27 DIAGNOSIS — E785 Hyperlipidemia, unspecified: Secondary | ICD-10-CM

## 2019-03-27 DIAGNOSIS — E119 Type 2 diabetes mellitus without complications: Secondary | ICD-10-CM | POA: Diagnosis not present

## 2019-03-27 DIAGNOSIS — N521 Erectile dysfunction due to diseases classified elsewhere: Secondary | ICD-10-CM

## 2019-03-27 LAB — BAYER DCA HB A1C WAIVED: HB A1C (BAYER DCA - WAIVED): 6.6 % (ref <7.0)

## 2019-03-27 MED ORDER — ATORVASTATIN CALCIUM 40 MG PO TABS: ORAL_TABLET | ORAL | 1 refills | Status: DC

## 2019-03-27 MED ORDER — METFORMIN HCL 1000 MG PO TABS: 1000.0000 mg | ORAL_TABLET | Freq: Two times a day (BID) | ORAL | 1 refills | Status: DC

## 2019-03-27 MED ORDER — LEVOTHYROXINE SODIUM 125 MCG PO TABS: 125.0000 ug | ORAL_TABLET | Freq: Every day | ORAL | 1 refills | Status: DC

## 2019-03-27 MED ORDER — SILDENAFIL CITRATE 20 MG PO TABS: 20.0000 mg | ORAL_TABLET | Freq: Three times a day (TID) | ORAL | 5 refills | Status: DC

## 2019-03-27 MED ORDER — GLIMEPIRIDE 4 MG PO TABS: ORAL_TABLET | ORAL | 1 refills | Status: DC

## 2019-03-27 MED ORDER — LISINOPRIL-HYDROCHLOROTHIAZIDE 10-12.5 MG PO TABS: 1.0000 | ORAL_TABLET | Freq: Every day | ORAL | 1 refills | Status: DC

## 2019-03-27 NOTE — Progress Notes (Signed)
Virtual Visit via telephone Note Due to COVID-19 pandemic this visit was conducted virtually. This visit type was conducted due to national recommendations for restrictions regarding the COVID-19 Pandemic (e.g. social distancing, sheltering in place) in an effort to limit this patient's exposure and mitigate transmission in our community. All issues noted in this document were discussed and addressed.  A physical exam was not performed with this format.  I connected with Tyler Duran on 03/27/19 at 7:50 by telephone and verified that I am speaking with the correct person using two identifiers. Tyler Duran is currently located at office and no one is currently with him during visit. The provider, Mary-Margaret Hassell Done, FNP is located in their office at time of visit.  I discussed the limitations, risks, security and privacy concerns of performing an evaluation and management service by telephone and the availability of in person appointments. I also discussed with the patient that there may be a patient responsible charge related to this service. The patient expressed understanding and agreed to proceed.   History and Present Illness:   Chief Complaint: Medical Management of Chronic Issues    HPI:  1. Essential hypertension No c/o chest pain,does not check blood pressure at home.  BP Readings from Last 3 Encounters:  03/09/19 (!) 95/52  01/31/19 122/72  12/13/18 110/73     2. Hyperlipidemia with target LDL less than 100 Does try to watch diet but does little exercise. Lab Results  Component Value Date   CHOL 123 12/13/2018   HDL 33 (L) 12/13/2018   LDLCALC 49 12/13/2018   TRIG 203 (H) 12/13/2018   CHOLHDL 3.7 12/13/2018     3. Hypothyroidism, unspecified type No problems that aware of.  4. Type 2 diabetes mellitus without complication, without long-term current use of insulin (HCC) Fasting blood sugars have been running around 140-160. Lab Results  Component Value  Date   HGBA1C 6.9 12/13/2018     5. Morbid obesity ([place]) No recent weight chnages. Wt Readings from Last 3 Encounters:  03/09/19 270 lb (122.5 kg)  02/27/19 270 lb (122.5 kg)  01/31/19 266 lb (120.7 kg)   BMI Readings from Last 3 Encounters:  03/09/19 31.20 kg/m  02/27/19 36.62 kg/m  01/31/19 35.09 kg/m       Outpatient Encounter Medications as of 03/27/2019  Medication Sig  . atorvastatin (LIPITOR) 40 MG tablet TAKE 1 TABLET (40 MG TOTAL) BY MOUTH DAILY.  Marland Kitchen glimepiride (AMARYL) 4 MG tablet TAKE 1 TABLET(4 MG) BY MOUTH DAILY WITH BREAKFAST  . glucose blood (ONETOUCH VERIO) test strip Check blood sugar every morning fasting and prn  Dx 250.02  . indomethacin (INDOCIN) 50 MG capsule Take 1 capsule (50 mg total) by mouth 3 (three) times daily with meals as needed.  Marland Kitchen levothyroxine (SYNTHROID) 125 MCG tablet Take 1 tablet (125 mcg total) by mouth daily before breakfast.  . lisinopril-hydrochlorothiazide (ZESTORETIC) 10-12.5 MG tablet Take 1 tablet by mouth daily.  . metFORMIN (GLUCOPHAGE) 1000 MG tablet Take 1 tablet (1,000 mg total) by mouth 2 (two) times daily with a meal.     Past Surgical History:  Procedure Laterality Date  . APPENDECTOMY    . CHOLECYSTECTOMY N/A 07/21/2013   Procedure: LAPAROSCOPIC CHOLECYSTECTOMY;  Surgeon: Jamesetta So, MD;  Location: [facility];  Service: General;  Laterality: N/A;    Family History  Problem Relation Age of Onset  . Cancer Mother   . Diabetes Father   . Colon polyps Father   . Colon cancer  Neg Hx   . Esophageal cancer Neg Hx   . Rectal cancer Neg Hx   . Stomach cancer Neg Hx     New complaints: Having some problems with erectile dysfunction.   Social history: Lives with wife. 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  Eyes: Negative for blurred vision, double vision and pain.  Respiratory: Negative for shortness of  breath.   Cardiovascular: Negative for chest pain, palpitations, orthopnea and leg swelling.  Gastrointestinal: Negative for abdominal pain.  Skin: Negative for rash.  Neurological: Negative for dizziness, sensory change, loss of consciousness, weakness and headaches.  Endo/Heme/Allergies: Negative for polydipsia. Does not bruise/bleed easily.  Psychiatric/Behavioral: Negative for memory loss. The patient does not have insomnia.   All other systems reviewed and are negative.    Observations/Objective: Alert and oriented- answers all questions appropriately No distress    Assessment and Plan: Tyler Duran comes in today with chief complaint of Medical Management of Chronic Issues   Diagnosis and orders addressed:  1. Essential hypertension Low sdoium diet - lisinopril-hydrochlorothiazide (ZESTORETIC) 10-12.5 MG tablet; Take 1 tablet by mouth daily.  Dispense: 90 tablet; Refill: 1 - CMP14+EGFR  2. Hyperlipidemia with target LDL less than 100 Low fat diet - atorvastatin (LIPITOR) 40 MG tablet; TAKE 1 TABLET (40 MG TOTAL) BY MOUTH DAILY.  Dispense: 90 tablet; Refill: 1 - Lipid panel  3.  Acquired hypothyroidism - levothyroxine (SYNTHROID) 125 MCG tablet; Take 1 tablet (125 mcg total) by mouth daily before breakfast.  Dispense: 90 tablet; Refill: 1 - Thyroid Panel With TSH  4. Type 2 diabetes mellitus without complication, without long-term current use of insulin (HCC) Continue to watch carbs in diet - glimepiride (AMARYL) 4 MG tablet; TAKE 1 TABLET(4 MG) BY MOUTH DAILY WITH BREAKFAST  Dispense: 90 tablet; Refill: 1 - metFORMIN (GLUCOPHAGE) 1000 MG tablet; Take 1 tablet (1,000 mg total) by mouth 2 (two) times daily with a meal.  Dispense: 180 tablet; Refill: 1 - Bayer DCA Hb A1c Waived  5. Morbid obesity ([place]) Discussed diet and exercise for person with BMI >25 Will recheck weight in 3-6 months  6.7. Need for immunization against influenza - Flu Vaccine QUAD 36+ mos IM   8. Erectile dysfunction associated with type 2 diabetes mellitus (HCC) - sildenafil (REVATIO) 20 MG tablet; Take 1 tablet (20 mg total) by mouth 3 (three) times daily.  Dispense: 30 tablet; Refill: 5   Labs pending Health Maintenance reviewed Diet and exercise encouraged  Follow up plan: 3 months    I discussed the assessment and treatment plan with the patient. The patient was provided an opportunity to ask questions and all were answered. The patient agreed with the plan and demonstrated an understanding of the instructions.   The patient was advised to call back or seek an in-person evaluation if the symptoms worsen or if the condition fails to improve as anticipated.  The above assessment and management plan was discussed with the patient. The patient verbalized understanding of and has agreed to the management plan. Patient is aware to call the clinic if symptoms persist or worsen. Patient is aware when to return to the clinic for a follow-up visit. Patient educated on when it is appropriate to go to the emergency department.   Time call ended:  8:05  I provided 15 minutes of non-face-to-face time during this encounter.    Mary-Margaret Hassell Done, FNP

## 2019-03-28 LAB — CMP14+EGFR
ALT: 36 IU/L (ref 0–44)
AST: 24 IU/L (ref 0–40)
Albumin/Globulin Ratio: 1.9 (ref 1.2–2.2)
Albumin: 4.5 g/dL (ref 4.0–5.0)
Alkaline Phosphatase: 97 IU/L (ref 39–117)
BUN/Creatinine Ratio: 14 (ref 9–20)
BUN: 16 mg/dL (ref 6–24)
Bilirubin Total: 0.6 mg/dL (ref 0.0–1.2)
CO2: 22 mmol/L (ref 20–29)
Calcium: 9.9 mg/dL (ref 8.7–10.2)
Chloride: 97 mmol/L (ref 96–106)
Creatinine, Ser: 1.18 mg/dL (ref 0.76–1.27)
GFR calc Af Amer: 83 mL/min/{1.73_m2} (ref >59)
GFR calc non Af Amer: 72 mL/min/{1.73_m2} (ref >59)
Globulin, Total: 2.4 g/dL (ref 1.5–4.5)
Glucose: 110 mg/dL — ABNORMAL HIGH (ref 65–99)
Potassium: 4.6 mmol/L (ref 3.5–5.2)
Sodium: 137 mmol/L (ref 134–144)
Total Protein: 6.9 g/dL (ref 6.0–8.5)

## 2019-03-28 LAB — THYROID PANEL WITH TSH
Free Thyroxine Index: 2.3 (ref 1.2–4.9)
T3 Uptake Ratio: 27 % (ref 24–39)
T4, Total: 8.5 ug/dL (ref 4.5–12.0)
TSH: 7.52 u[IU]/mL — ABNORMAL HIGH (ref 0.450–4.500)

## 2019-03-28 LAB — LIPID PANEL
Chol/HDL Ratio: 3.3 ratio (ref 0.0–5.0)
Cholesterol, Total: 123 mg/dL (ref 100–199)
HDL: 37 mg/dL — ABNORMAL LOW (ref >39)
LDL Chol Calc (NIH): 63 mg/dL (ref 0–99)
Triglycerides: 130 mg/dL (ref 0–149)
VLDL Cholesterol Cal: 23 mg/dL (ref 5–40)

## 2019-03-28 MED ORDER — LEVOTHYROXINE SODIUM 150 MCG PO TABS: 150.0000 ug | ORAL_TABLET | Freq: Every day | ORAL | 3 refills | Status: DC

## 2019-03-28 NOTE — Addendum Note (Signed)
Addended by: Chevis Pretty on: 03/28/2019 03:30 PM   Modules accepted: Orders

## 2019-04-24 ENCOUNTER — Other Ambulatory Visit: Payer: BC Managed Care – PPO

## 2019-04-24 ENCOUNTER — Other Ambulatory Visit: Payer: Self-pay

## 2019-05-22 ENCOUNTER — Other Ambulatory Visit: Payer: BC Managed Care – PPO

## 2019-05-22 ENCOUNTER — Other Ambulatory Visit: Payer: Self-pay

## 2019-05-22 DIAGNOSIS — E039 Hypothyroidism, unspecified: Secondary | ICD-10-CM | POA: Diagnosis not present

## 2019-05-23 LAB — THYROID PANEL WITH TSH
Free Thyroxine Index: 2.5 (ref 1.2–4.9)
T3 Uptake Ratio: 32 % (ref 24–39)
T4, Total: 7.8 ug/dL (ref 4.5–12.0)
TSH: 3.3 u[IU]/mL (ref 0.450–4.500)

## 2019-06-26 ENCOUNTER — Other Ambulatory Visit: Payer: Self-pay

## 2019-06-27 ENCOUNTER — Encounter: Payer: Self-pay | Admitting: Nurse Practitioner

## 2019-06-27 ENCOUNTER — Ambulatory Visit: Payer: BC Managed Care – PPO | Admitting: Nurse Practitioner

## 2019-06-27 VITALS — BP 101/62 | HR 86 | Temp 96.9°F | Resp 20 | Ht 71.0 in | Wt 265.0 lb

## 2019-06-27 DIAGNOSIS — E039 Hypothyroidism, unspecified: Secondary | ICD-10-CM

## 2019-06-27 DIAGNOSIS — I10 Essential (primary) hypertension: Secondary | ICD-10-CM | POA: Diagnosis not present

## 2019-06-27 DIAGNOSIS — E785 Hyperlipidemia, unspecified: Secondary | ICD-10-CM | POA: Diagnosis not present

## 2019-06-27 DIAGNOSIS — E119 Type 2 diabetes mellitus without complications: Secondary | ICD-10-CM

## 2019-06-27 LAB — CMP14+EGFR
ALT: 32 IU/L (ref 0–44)
AST: 17 IU/L (ref 0–40)
Albumin/Globulin Ratio: 1.7 (ref 1.2–2.2)
Albumin: 4.2 g/dL (ref 4.0–5.0)
Alkaline Phosphatase: 104 IU/L (ref 39–117)
BUN/Creatinine Ratio: 14 (ref 9–20)
BUN: 16 mg/dL (ref 6–24)
Bilirubin Total: 0.4 mg/dL (ref 0.0–1.2)
CO2: 25 mmol/L (ref 20–29)
Calcium: 9.6 mg/dL (ref 8.7–10.2)
Chloride: 104 mmol/L (ref 96–106)
Creatinine, Ser: 1.16 mg/dL (ref 0.76–1.27)
GFR calc Af Amer: 84 mL/min/{1.73_m2} (ref >59)
GFR calc non Af Amer: 73 mL/min/{1.73_m2} (ref >59)
Globulin, Total: 2.5 g/dL (ref 1.5–4.5)
Glucose: 142 mg/dL — ABNORMAL HIGH (ref 65–99)
Potassium: 4.8 mmol/L (ref 3.5–5.2)
Sodium: 142 mmol/L (ref 134–144)
Total Protein: 6.7 g/dL (ref 6.0–8.5)

## 2019-06-27 LAB — LIPID PANEL
Chol/HDL Ratio: 3.7 ratio (ref 0.0–5.0)
Cholesterol, Total: 126 mg/dL (ref 100–199)
HDL: 34 mg/dL — ABNORMAL LOW (ref >39)
LDL Chol Calc (NIH): 61 mg/dL (ref 0–99)
Triglycerides: 187 mg/dL — ABNORMAL HIGH (ref 0–149)
VLDL Cholesterol Cal: 31 mg/dL (ref 5–40)

## 2019-06-27 LAB — BAYER DCA HB A1C WAIVED: HB A1C (BAYER DCA - WAIVED): 7.3 % — ABNORMAL HIGH (ref <7.0)

## 2019-06-27 MED ORDER — ATORVASTATIN CALCIUM 40 MG PO TABS: ORAL_TABLET | ORAL | 1 refills | Status: DC

## 2019-06-27 MED ORDER — LISINOPRIL-HYDROCHLOROTHIAZIDE 10-12.5 MG PO TABS: 1.0000 | ORAL_TABLET | Freq: Every day | ORAL | 1 refills | Status: DC

## 2019-06-27 MED ORDER — METFORMIN HCL 1000 MG PO TABS: 1000.0000 mg | ORAL_TABLET | Freq: Two times a day (BID) | ORAL | 1 refills | Status: DC

## 2019-06-27 MED ORDER — GLIMEPIRIDE 4 MG PO TABS: ORAL_TABLET | ORAL | 1 refills | Status: DC

## 2019-06-27 NOTE — Progress Notes (Signed)
Subjective:    Patient ID: Tyler Duran, male    DOB: [birth year], 49 y.o.   MRN: [id number]   Chief Complaint: Medical Management of Chronic Issues    HPI:  1. Essential hypertension No c/o chest pain, sob or headache. He does not check hi blood pressure at home. BP Readings from Last 3 Encounters:  06/27/19 101/62  03/09/19 (!) 95/52  01/31/19 122/72     2. Hyperlipidemia with target LDL less than 100 Does not watch diet and does very little exercise. Lab Results  Component Value Date   CHOL 123 03/27/2019   HDL 37 (L) 03/27/2019   LDLCALC 63 03/27/2019   TRIG 130 03/27/2019   CHOLHDL 3.3 03/27/2019     3. Type 2 diabetes mellitus without complication, without long-term current use of insulin (HCC) Fasting blood sugars are running below 160-180. Eating certain foods he says will raise his blood sugars. Lab Results  Component Value Date   HGBA1C 6.6 03/27/2019     4. Hypothyroidism, unspecified type No problems that he is aware of. Lab Results  Component Value Date   TSH 3.300 05/22/2019     5. Morbid obesity ([place]) No recent weight changes. Wt Readings from Last 3 Encounters:  06/27/19 265 lb (120.2 kg)  03/09/19 270 lb (122.5 kg)  02/27/19 270 lb (122.5 kg)   BMI Readings from Last 3 Encounters:  06/27/19 36.96 kg/m  03/09/19 31.20 kg/m  02/27/19 36.62 kg/m       Outpatient Encounter Medications as of 06/27/2019  Medication Sig  . atorvastatin (LIPITOR) 40 MG tablet TAKE 1 TABLET (40 MG TOTAL) BY MOUTH DAILY.  Marland Kitchen glimepiride (AMARYL) 4 MG tablet TAKE 1 TABLET(4 MG) BY MOUTH DAILY WITH BREAKFAST  . glucose blood (ONETOUCH VERIO) test strip Check blood sugar every morning fasting and prn  Dx 250.02  . levothyroxine (SYNTHROID) 150 MCG tablet Take 1 tablet (150 mcg total) by mouth daily.  Marland Kitchen lisinopril-hydrochlorothiazide (ZESTORETIC) 10-12.5 MG tablet Take 1 tablet by mouth daily.  . metFORMIN (GLUCOPHAGE) 1000 MG tablet Take 1 tablet (1,000 mg  total) by mouth 2 (two) times daily with a meal.  . indomethacin (INDOCIN) 50 MG capsule Take 1 capsule (50 mg total) by mouth 3 (three) times daily with meals as needed. (Patient not taking: Reported on 06/27/2019)  . sildenafil (REVATIO) 20 MG tablet Take 1 tablet (20 mg total) by mouth 3 (three) times daily. (Patient not taking: Reported on 06/27/2019)    Past Surgical History:  Procedure Laterality Date  . APPENDECTOMY    . CHOLECYSTECTOMY N/A 07/21/2013   Procedure: LAPAROSCOPIC CHOLECYSTECTOMY;  Surgeon: Jamesetta So, MD;  Location: [facility];  Service: General;  Laterality: N/A;    Family History  Problem Relation Age of Onset  . Cancer Mother   . Diabetes Father   . Colon polyps Father   . Colon cancer Neg Hx   . Esophageal cancer Neg Hx   . Rectal cancer Neg Hx   . Stomach cancer Neg Hx     New complaints: None today  Social history: Lives with wife. 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  Eyes: Negative for pain.  Respiratory: Negative for shortness of breath.   Cardiovascular: Negative for chest pain, palpitations and leg swelling.  Gastrointestinal: Negative for abdominal pain.  Endocrine: Negative for polydipsia.  Skin: Negative for rash.  Neurological: Negative for dizziness, weakness and headaches.  Hematological: Does not bruise/bleed easily.  All other systems reviewed and are negative.      Objective:   Physical Exam Vitals and nursing note reviewed.  Constitutional:      Appearance: Normal appearance. He is well-developed.  HENT:     Head: Normocephalic.     Nose: Nose normal.  Eyes:     Pupils: Pupils are equal, round, and reactive to light.  Neck:     Thyroid: No thyroid mass or thyromegaly.     Vascular: No carotid bruit or JVD.     Trachea: Phonation normal.  Cardiovascular:     Rate and Rhythm: Normal rate and regular rhythm.  Pulmonary:     Effort:  Pulmonary effort is normal. No respiratory distress.     Breath sounds: Normal breath sounds.  Abdominal:     General: Bowel sounds are normal.     Palpations: Abdomen is soft.     Tenderness: There is no abdominal tenderness.  Musculoskeletal:        General: Normal range of motion.     Cervical back: Normal range of motion and neck supple.  Lymphadenopathy:     Cervical: No cervical adenopathy.  Skin:    General: Skin is warm and dry.  Neurological:     Mental Status: He is alert and oriented to person, place, and time.  Psychiatric:        Behavior: Behavior normal.        Thought Content: Thought content normal.        Judgment: Judgment normal.    BP 101/62   Pulse 86   Temp (!) 96.9 F (36.1 C) (Temporal)   Resp 20   Ht '5\' 11"'$  (1.803 m)   Wt 265 lb (120.2 kg)   SpO2 95%   BMI 36.96 kg/m   hgbac 7.3%     Assessment & Plan:  Tyler Duran comes in today with chief complaint of Medical Management of Chronic Issues   Diagnosis and orders addressed:  1. Essential hypertension Low sodium diet - CMP14+EGFR - lisinopril-hydrochlorothiazide (ZESTORETIC) 10-12.5 MG tablet; Take 1 tablet by mouth daily.  Dispense: 90 tablet; Refill: 1  2. Hyperlipidemia with target LDL less than 100 Low fat diet - Lipid panel - atorvastatin (LIPITOR) 40 MG tablet; TAKE 1 TABLET (40 MG TOTAL) BY MOUTH DAILY.  Dispense: 90 tablet; Refill: 1  3. Type 2 diabetes mellitus without complication, without long-term current use of insulin (HCC) Continue to watch carbs in diet - Bayer DCA Hb A1c Waived - Microalbumin / creatinine urine ratio - metFORMIN (GLUCOPHAGE) 1000 MG tablet; Take 1 tablet (1,000 mg total) by mouth 2 (two) times daily with a meal.  Dispense: 180 tablet; Refill: 1 - glimepiride (AMARYL) 4 MG tablet; TAKE 1 TABLET(4 MG) BY MOUTH DAILY WITH BREAKFAST  Dispense: 90 tablet; Refill: 1  4. Hypothyroidism, unspecified type Labs pending  5. Morbid obesity ([place]) Discussed  diet and exercise for person with BMI >25 Will recheck weight in 3-6 months   Labs pending Health Maintenance reviewed Diet and exercise encouraged  Follow up plan: 3 months   Mary-Margaret Hassell Done, FNP

## 2019-06-27 NOTE — Patient Instructions (Signed)
Diabetes Mellitus and Foot Care Foot care is an important part of your health, especially when you have diabetes. Diabetes may cause you to have problems because of poor blood flow (circulation) to your feet and legs, which can cause your skin to:  Become thinner and drier.  Break more easily.  Heal more slowly.  Peel and crack. You may also have nerve damage (neuropathy) in your legs and feet, causing decreased feeling in them. This means that you may not notice minor injuries to your feet that could lead to more serious problems. Noticing and addressing any potential problems early is the best way to prevent future foot problems. How to care for your feet Foot hygiene  Wash your feet daily with warm water and mild soap. Do not use hot water. Then, pat your feet and the areas between your toes until they are completely dry. Do not soak your feet as this can dry your skin.  Trim your toenails straight across. Do not dig under them or around the cuticle. File the edges of your nails with an emery board or nail file.  Apply a moisturizing lotion or petroleum jelly to the skin on your feet and to dry, brittle toenails. Use lotion that does not contain alcohol and is unscented. Do not apply lotion between your toes. Shoes and socks  Wear clean socks or stockings every day. Make sure they are not too tight. Do not wear knee-high stockings since they may decrease blood flow to your legs.  Wear shoes that fit properly and have enough cushioning. Always look in your shoes before you put them on to be sure there are no objects inside.  To break in new shoes, wear them for just a few hours a day. This prevents injuries on your feet. Wounds, scrapes, corns, and calluses  Check your feet daily for blisters, cuts, bruises, sores, and redness. If you cannot see the bottom of your feet, use a mirror or ask someone for help.  Do not cut corns or calluses or try to remove them with medicine.  If you  find a minor scrape, cut, or break in the skin on your feet, keep it and the skin around it clean and dry. You may clean these areas with mild soap and water. Do not clean the area with peroxide, alcohol, or iodine.  If you have a wound, scrape, corn, or callus on your foot, look at it several times a day to make sure it is healing and not infected. Check for: ? Redness, swelling, or pain. ? Fluid or blood. ? Warmth. ? Pus or a bad smell. General instructions  Do not cross your legs. This may decrease blood flow to your feet.  Do not use heating pads or hot water bottles on your feet. They may burn your skin. If you have lost feeling in your feet or legs, you may not know this is happening until it is too late.  Protect your feet from hot and cold by wearing shoes, such as at the beach or on hot pavement.  Schedule a complete foot exam at least once a year (annually) or more often if you have foot problems. If you have foot problems, report any cuts, sores, or bruises to your health care provider immediately. Contact a health care provider if:  You have a medical condition that increases your risk of infection and you have any cuts, sores, or bruises on your feet.  You have an injury that is not   healing.  You have redness on your legs or feet.  You feel burning or tingling in your legs or feet.  You have pain or cramps in your legs and feet.  Your legs or feet are numb.  Your feet always feel cold.  You have pain around a toenail. Get help right away if:  You have a wound, scrape, corn, or callus on your foot and: ? You have pain, swelling, or redness that gets worse. ? You have fluid or blood coming from the wound, scrape, corn, or callus. ? Your wound, scrape, corn, or callus feels warm to the touch. ? You have pus or a bad smell coming from the wound, scrape, corn, or callus. ? You have a fever. ? You have a red line going up your leg. Summary  Check your feet every day  for cuts, sores, red spots, swelling, and blisters.  Moisturize feet and legs daily.  Wear shoes that fit properly and have enough cushioning.  If you have foot problems, report any cuts, sores, or bruises to your health care provider immediately.  Schedule a complete foot exam at least once a year (annually) or more often if you have foot problems. This information is not intended to replace advice given to you by your health care provider. Make sure you discuss any questions you have with your health care provider. Document Revised: 02/15/2019 Document Reviewed: 06/26/2016 Elsevier Patient Education  2020 Elsevier Inc.  

## 2019-09-05 ENCOUNTER — Other Ambulatory Visit: Payer: Self-pay | Admitting: Nurse Practitioner

## 2019-09-05 DIAGNOSIS — E119 Type 2 diabetes mellitus without complications: Secondary | ICD-10-CM

## 2019-09-25 ENCOUNTER — Ambulatory Visit: Payer: BC Managed Care – PPO | Admitting: Nurse Practitioner

## 2019-09-25 ENCOUNTER — Other Ambulatory Visit: Payer: Self-pay

## 2019-09-25 ENCOUNTER — Encounter: Payer: Self-pay | Admitting: Nurse Practitioner

## 2019-09-25 VITALS — BP 118/73 | HR 90 | Temp 98.2°F | Resp 20 | Ht 71.0 in | Wt 267.0 lb

## 2019-09-25 DIAGNOSIS — I10 Essential (primary) hypertension: Secondary | ICD-10-CM

## 2019-09-25 DIAGNOSIS — Z23 Encounter for immunization: Secondary | ICD-10-CM

## 2019-09-25 DIAGNOSIS — M109 Gout, unspecified: Secondary | ICD-10-CM | POA: Insufficient documentation

## 2019-09-25 DIAGNOSIS — E119 Type 2 diabetes mellitus without complications: Secondary | ICD-10-CM | POA: Diagnosis not present

## 2019-09-25 DIAGNOSIS — M10072 Idiopathic gout, left ankle and foot: Secondary | ICD-10-CM

## 2019-09-25 DIAGNOSIS — E785 Hyperlipidemia, unspecified: Secondary | ICD-10-CM | POA: Diagnosis not present

## 2019-09-25 DIAGNOSIS — E039 Hypothyroidism, unspecified: Secondary | ICD-10-CM

## 2019-09-25 LAB — BAYER DCA HB A1C WAIVED: HB A1C (BAYER DCA - WAIVED): 7.2 % — ABNORMAL HIGH (ref <7.0)

## 2019-09-25 MED ORDER — GLIMEPIRIDE 4 MG PO TABS: 4.0000 mg | ORAL_TABLET | Freq: Every day | ORAL | 1 refills | Status: DC

## 2019-09-25 MED ORDER — LISINOPRIL-HYDROCHLOROTHIAZIDE 10-12.5 MG PO TABS: 1.0000 | ORAL_TABLET | Freq: Every day | ORAL | 1 refills | Status: DC

## 2019-09-25 MED ORDER — ATORVASTATIN CALCIUM 40 MG PO TABS: ORAL_TABLET | ORAL | 1 refills | Status: DC

## 2019-09-25 MED ORDER — LEVOTHYROXINE SODIUM 150 MCG PO TABS: 150.0000 ug | ORAL_TABLET | Freq: Every day | ORAL | 3 refills | Status: DC

## 2019-09-25 MED ORDER — INDOMETHACIN 50 MG PO CAPS: 50.0000 mg | ORAL_CAPSULE | Freq: Three times a day (TID) | ORAL | 3 refills | Status: DC | PRN

## 2019-09-25 MED ORDER — METFORMIN HCL 1000 MG PO TABS: 1000.0000 mg | ORAL_TABLET | Freq: Two times a day (BID) | ORAL | 1 refills | Status: DC

## 2019-09-25 NOTE — Progress Notes (Signed)
Subjective:    Patient ID: Tyler Duran, male    DOB: 1971-02-27, 49 y.o.   MRN: [id number]   Chief Complaint: Medical Management of Chronic Issues    HPI:  1. Essential hypertension No c/o chest pain, sob or headache. Does not check blood pressure at home. BP Readings from Last 3 Encounters:  06/27/19 101/62  03/09/19 (!) 95/52  01/31/19 122/72     2. Hyperlipidemia with target LDL less than 100 Tries to watch diet. Does no dedicated exercise Lab Results  Component Value Date   CHOL 126 06/27/2019   HDL 34 (L) 06/27/2019   LDLCALC 61 06/27/2019   TRIG 187 (H) 06/27/2019   CHOLHDL 3.7 06/27/2019     3. Type 2 diabetes mellitus without complication, without long-term current use of insulin (HCC) Fasting blood sugars are running around 150-170 most mornings. deneis any low blood sugars. Lab Results  Component Value Date   HGBA1C 7.3 (H) 06/27/2019     4. Hypothyroidism, unspecified type Having no problems that he is aware of. Lab Results  Component Value Date   TSH 3.300 05/22/2019     5. Morbid obesity ([place]) No recent weight changes Wt Readings from Last 3 Encounters:  06/27/19 265 lb (120.2 kg)  03/09/19 270 lb (122.5 kg)  02/27/19 270 lb (122.5 kg)   BMI Readings from Last 3 Encounters:  06/27/19 36.96 kg/m  03/09/19 31.20 kg/m  02/27/19 36.62 kg/m       Outpatient Encounter Medications as of 09/25/2019  Medication Sig  . atorvastatin (LIPITOR) 40 MG tablet TAKE 1 TABLET (40 MG TOTAL) BY MOUTH DAILY.  Marland Kitchen glimepiride (AMARYL) 4 MG tablet TAKE 1 TABLET(4 MG) BY MOUTH DAILY WITH BREAKFAST  . glucose blood (ONETOUCH VERIO) test strip Check blood sugar every morning fasting and prn  Dx 250.02  . indomethacin (INDOCIN) 50 MG capsule Take 1 capsule (50 mg total) by mouth 3 (three) times daily with meals as needed. (Patient not taking: Reported on 06/27/2019)  . levothyroxine (SYNTHROID) 150 MCG tablet Take 1 tablet (150 mcg total) by mouth daily.  Marland Kitchen  lisinopril-hydrochlorothiazide (ZESTORETIC) 10-12.5 MG tablet Take 1 tablet by mouth daily.  . metFORMIN (GLUCOPHAGE) 1000 MG tablet Take 1 tablet (1,000 mg total) by mouth 2 (two) times daily with a meal.  . sildenafil (REVATIO) 20 MG tablet Take 1 tablet (20 mg total) by mouth 3 (three) times daily. (Patient not taking: Reported on 06/27/2019)    Past Surgical History:  Procedure Laterality Date  . APPENDECTOMY    . CHOLECYSTECTOMY N/A 07/21/2013   Procedure: LAPAROSCOPIC CHOLECYSTECTOMY;  Surgeon: Jamesetta So, MD;  Location: [facility];  Service: General;  Laterality: N/A;    Family History  Problem Relation Age of Onset  . Cancer Mother   . Diabetes Father   . Colon polyps Father   . Colon cancer Neg Hx   . Esophageal cancer Neg Hx   . Rectal cancer Neg Hx   . Stomach cancer Neg Hx     New complaints: Has had gout flare up in bil ankles over the weekend. He had no indocin to take. He says it is slightly better.  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  Eyes: Negative for pain.  Respiratory: Negative for shortness of breath.   Cardiovascular: Negative for chest pain, palpitations and leg swelling.  Gastrointestinal: Negative for abdominal pain.  Endocrine: Negative  for polydipsia.  Skin: Negative for rash.  Neurological: Negative for dizziness, weakness and headaches.  Hematological: Does not bruise/bleed easily.  All other systems reviewed and are negative.      Objective:   Physical Exam Vitals and nursing note reviewed.  Constitutional:      Appearance: Normal appearance. He is well-developed.  HENT:     Head: Normocephalic.     Nose: Nose normal.  Eyes:     Pupils: Pupils are equal, round, and reactive to light.  Neck:     Thyroid: No thyroid mass or thyromegaly.     Vascular: No carotid bruit or JVD.     Trachea: Phonation normal.    Cardiovascular:     Rate and Rhythm: Normal rate and regular rhythm.  Pulmonary:     Effort: Pulmonary effort is normal. No respiratory distress.     Breath sounds: Normal breath sounds.  Abdominal:     General: Bowel sounds are normal.     Palpations: Abdomen is soft.     Tenderness: There is no abdominal tenderness.  Musculoskeletal:        General: Normal range of motion.     Cervical back: Normal range of motion and neck supple.  Lymphadenopathy:     Cervical: No cervical adenopathy.  Skin:    General: Skin is warm and dry.  Neurological:     Mental Status: He is alert and oriented to person, place, and time.  Psychiatric:        Behavior: Behavior normal.        Thought Content: Thought content normal.        Judgment: Judgment normal.    BP 118/73   Pulse 90   Temp 98.2 F (36.8 C) (Temporal)   Resp 20   Ht '5\' 11"'$  (1.803 m)   Wt 267 lb (121.1 kg)   SpO2 98%   BMI 37.24 kg/m   HGBA1c 7.2     Assessment & Plan:  Saturnino Liew comes in today with chief complaint of Medical Management of Chronic Issues   Diagnosis and orders addressed:  1. Essential hypertension Low sodium diet - CMP14+EGFR - CBC with Differential/Platelet - lisinopril-hydrochlorothiazide (ZESTORETIC) 10-12.5 MG tablet; Take 1 tablet by mouth daily.  Dispense: 90 tablet; Refill: 1  2. Hyperlipidemia with target LDL less than 10) Low fta diet - Lipid panel - atorvastatin (LIPITOR) 40 MG tablet; TAKE 1 TABLET (40 MG TOTAL) BY MOUTH DAILY.  Dispense: 90 tablet; Refill: 1  3. Type 2 diabetes mellitus without complication, without long-term current use of insulin (HCC) Continue to watch carbs in diet - Bayer DCA Hb A1c Waived - glimepiride (AMARYL) 4 MG tablet; Take 1 tablet (4 mg total) by mouth daily with breakfast.  Dispense: 90 tablet; Refill: 1 - metFORMIN (GLUCOPHAGE) 1000 MG tablet; Take 1 tablet (1,000 mg total) by mouth 2 (two) times daily with a meal.  Dispense: 180 tablet; Refill:  1  4. Morbid obesity ([place]) Discussed diet and exercise for person with BMI >25 Will recheck weight in 3-6 months   5. Acute idiopathic gout of left ankle Low purine diet - indomethacin (INDOCIN) 50 MG capsule; Take 1 capsule (50 mg total) by mouth 3 (three) times daily with meals as needed.  Dispense: 30 capsule; Refill: 3  6. Acquired hypothyroidism - levothyroxine (SYNTHROID) 150 MCG tablet; Take 1 tablet (150 mcg total) by mouth daily.  Dispense: 90 tablet; Refill: 3   Labs pending Health Maintenance reviewed Diet and exercise  encouraged  Follow up plan: 3 months   Mary-Margaret Hassell Done, FNP

## 2019-09-25 NOTE — Patient Instructions (Signed)

## 2019-09-25 NOTE — Addendum Note (Signed)
Addended by: Rolena Infante on: 09/25/2019 08:50 AM   Modules accepted: Orders

## 2019-09-26 LAB — LIPID PANEL
Chol/HDL Ratio: 3.2 ratio (ref 0.0–5.0)
Cholesterol, Total: 114 mg/dL (ref 100–199)
HDL: 36 mg/dL — ABNORMAL LOW (ref >39)
LDL Chol Calc (NIH): 55 mg/dL (ref 0–99)
Triglycerides: 130 mg/dL (ref 0–149)
VLDL Cholesterol Cal: 23 mg/dL (ref 5–40)

## 2019-09-26 LAB — CBC WITH DIFFERENTIAL/PLATELET
Basophils Absolute: 0.1 10*3/uL (ref 0.0–0.2)
Basos: 1 %
EOS (ABSOLUTE): 0.6 10*3/uL — ABNORMAL HIGH (ref 0.0–0.4)
Eos: 4 %
Hematocrit: 41.4 % (ref 37.5–51.0)
Hemoglobin: 14.1 g/dL (ref 13.0–17.7)
Immature Grans (Abs): 0.1 10*3/uL (ref 0.0–0.1)
Immature Granulocytes: 1 %
Lymphocytes Absolute: 4 10*3/uL — ABNORMAL HIGH (ref 0.7–3.1)
Lymphs: 31 %
MCH: 28.4 pg (ref 26.6–33.0)
MCHC: 34.1 g/dL (ref 31.5–35.7)
MCV: 84 fL (ref 79–97)
Monocytes Absolute: 1.3 10*3/uL — ABNORMAL HIGH (ref 0.1–0.9)
Monocytes: 10 %
Neutrophils Absolute: 6.9 10*3/uL (ref 1.4–7.0)
Neutrophils: 53 %
Platelets: 246 10*3/uL (ref 150–450)
RBC: 4.96 x10E6/uL (ref 4.14–5.80)
RDW: 13 % (ref 11.6–15.4)
WBC: 12.9 10*3/uL — ABNORMAL HIGH (ref 3.4–10.8)

## 2019-09-26 LAB — CMP14+EGFR
ALT: 29 IU/L (ref 0–44)
AST: 19 IU/L (ref 0–40)
Albumin/Globulin Ratio: 1.7 (ref 1.2–2.2)
Albumin: 4.4 g/dL (ref 4.0–5.0)
Alkaline Phosphatase: 105 IU/L (ref 39–117)
BUN/Creatinine Ratio: 16 (ref 9–20)
BUN: 18 mg/dL (ref 6–24)
Bilirubin Total: 0.4 mg/dL (ref 0.0–1.2)
CO2: 26 mmol/L (ref 20–29)
Calcium: 9.4 mg/dL (ref 8.7–10.2)
Chloride: 101 mmol/L (ref 96–106)
Creatinine, Ser: 1.15 mg/dL (ref 0.76–1.27)
GFR calc Af Amer: 85 mL/min/{1.73_m2} (ref >59)
GFR calc non Af Amer: 74 mL/min/{1.73_m2} (ref >59)
Globulin, Total: 2.6 g/dL (ref 1.5–4.5)
Glucose: 144 mg/dL — ABNORMAL HIGH (ref 65–99)
Potassium: 4.4 mmol/L (ref 3.5–5.2)
Sodium: 140 mmol/L (ref 134–144)
Total Protein: 7 g/dL (ref 6.0–8.5)

## 2019-09-26 LAB — MICROALBUMIN / CREATININE URINE RATIO
Creatinine, Urine: 174.7 mg/dL
Microalb/Creat Ratio: 4 mg/g creat (ref 0–29)
Microalbumin, Urine: 7.5 ug/mL

## 2019-11-29 LAB — HM DIABETES EYE EXAM

## 2020-01-08 ENCOUNTER — Other Ambulatory Visit: Payer: Self-pay

## 2020-01-08 ENCOUNTER — Other Ambulatory Visit: Payer: BC Managed Care – PPO

## 2020-01-08 DIAGNOSIS — I10 Essential (primary) hypertension: Secondary | ICD-10-CM

## 2020-01-08 DIAGNOSIS — E785 Hyperlipidemia, unspecified: Secondary | ICD-10-CM | POA: Diagnosis not present

## 2020-01-08 DIAGNOSIS — E119 Type 2 diabetes mellitus without complications: Secondary | ICD-10-CM

## 2020-01-08 LAB — BAYER DCA HB A1C WAIVED: HB A1C (BAYER DCA - WAIVED): 6.8 % (ref <7.0)

## 2020-01-09 ENCOUNTER — Encounter: Payer: Self-pay | Admitting: Nurse Practitioner

## 2020-01-09 ENCOUNTER — Ambulatory Visit (INDEPENDENT_AMBULATORY_CARE_PROVIDER_SITE_OTHER): Payer: BC Managed Care – PPO | Admitting: Nurse Practitioner

## 2020-01-09 DIAGNOSIS — E119 Type 2 diabetes mellitus without complications: Secondary | ICD-10-CM | POA: Diagnosis not present

## 2020-01-09 DIAGNOSIS — E039 Hypothyroidism, unspecified: Secondary | ICD-10-CM | POA: Diagnosis not present

## 2020-01-09 DIAGNOSIS — I10 Essential (primary) hypertension: Secondary | ICD-10-CM | POA: Diagnosis not present

## 2020-01-09 DIAGNOSIS — E785 Hyperlipidemia, unspecified: Secondary | ICD-10-CM | POA: Diagnosis not present

## 2020-01-09 DIAGNOSIS — M10072 Idiopathic gout, left ankle and foot: Secondary | ICD-10-CM

## 2020-01-09 LAB — CBC WITH DIFFERENTIAL/PLATELET
Basophils Absolute: 0.1 10*3/uL (ref 0.0–0.2)
Basos: 1 %
EOS (ABSOLUTE): 0.3 10*3/uL (ref 0.0–0.4)
Eos: 4 %
Hematocrit: 38.8 % (ref 37.5–51.0)
Hemoglobin: 12.4 g/dL — ABNORMAL LOW (ref 13.0–17.7)
Immature Grans (Abs): 0 10*3/uL (ref 0.0–0.1)
Immature Granulocytes: 0 %
Lymphocytes Absolute: 2.3 10*3/uL (ref 0.7–3.1)
Lymphs: 29 %
MCH: 29.2 pg (ref 26.6–33.0)
MCHC: 32 g/dL (ref 31.5–35.7)
MCV: 92 fL (ref 79–97)
Monocytes Absolute: 1.2 10*3/uL — ABNORMAL HIGH (ref 0.1–0.9)
Monocytes: 15 %
Neutrophils Absolute: 4 10*3/uL (ref 1.4–7.0)
Neutrophils: 51 %
Platelets: 385 10*3/uL (ref 150–450)
RBC: 4.24 x10E6/uL (ref 4.14–5.80)
RDW: 15.6 % — ABNORMAL HIGH (ref 11.6–15.4)
WBC: 8 10*3/uL (ref 3.4–10.8)

## 2020-01-09 LAB — CMP14+EGFR
ALT: 28 IU/L (ref 0–44)
AST: 18 IU/L (ref 0–40)
Albumin/Globulin Ratio: 1.6 (ref 1.2–2.2)
Albumin: 4.5 g/dL (ref 4.0–5.0)
Alkaline Phosphatase: 107 IU/L (ref 48–121)
BUN/Creatinine Ratio: 16 (ref 9–20)
BUN: 17 mg/dL (ref 6–24)
Bilirubin Total: 0.3 mg/dL (ref 0.0–1.2)
CO2: 23 mmol/L (ref 20–29)
Calcium: 9.8 mg/dL (ref 8.7–10.2)
Chloride: 102 mmol/L (ref 96–106)
Creatinine, Ser: 1.07 mg/dL (ref 0.76–1.27)
GFR calc Af Amer: 93 mL/min/{1.73_m2} (ref >59)
GFR calc non Af Amer: 81 mL/min/{1.73_m2} (ref >59)
Globulin, Total: 2.8 g/dL (ref 1.5–4.5)
Glucose: 129 mg/dL — ABNORMAL HIGH (ref 65–99)
Potassium: 5.1 mmol/L (ref 3.5–5.2)
Sodium: 141 mmol/L (ref 134–144)
Total Protein: 7.3 g/dL (ref 6.0–8.5)

## 2020-01-09 LAB — LIPID PANEL
Chol/HDL Ratio: 3.8 ratio (ref 0.0–5.0)
Cholesterol, Total: 124 mg/dL (ref 100–199)
HDL: 33 mg/dL — ABNORMAL LOW (ref >39)
LDL Chol Calc (NIH): 61 mg/dL (ref 0–99)
Triglycerides: 178 mg/dL — ABNORMAL HIGH (ref 0–149)
VLDL Cholesterol Cal: 30 mg/dL (ref 5–40)

## 2020-01-09 MED ORDER — METFORMIN HCL 1000 MG PO TABS: 1000.0000 mg | ORAL_TABLET | Freq: Two times a day (BID) | ORAL | 1 refills | Status: DC

## 2020-01-09 MED ORDER — LEVOTHYROXINE SODIUM 150 MCG PO TABS: 150.0000 ug | ORAL_TABLET | Freq: Every day | ORAL | 3 refills | Status: DC

## 2020-01-09 MED ORDER — LISINOPRIL-HYDROCHLOROTHIAZIDE 10-12.5 MG PO TABS: 1.0000 | ORAL_TABLET | Freq: Every day | ORAL | 1 refills | Status: DC

## 2020-01-09 MED ORDER — INDOMETHACIN 50 MG PO CAPS: 50.0000 mg | ORAL_CAPSULE | Freq: Three times a day (TID) | ORAL | 3 refills | Status: DC | PRN

## 2020-01-09 MED ORDER — GLIMEPIRIDE 4 MG PO TABS: 4.0000 mg | ORAL_TABLET | Freq: Every day | ORAL | 1 refills | Status: DC

## 2020-01-09 MED ORDER — ATORVASTATIN CALCIUM 40 MG PO TABS: ORAL_TABLET | ORAL | 1 refills | Status: DC

## 2020-01-09 NOTE — Progress Notes (Signed)
Virtual Visit via telephone Note Due to COVID-19 pandemic this visit was conducted virtually. This visit type was conducted due to national recommendations for restrictions regarding the COVID-19 Pandemic (e.g. social distancing, sheltering in place) in an effort to limit this patient's exposure and mitigate transmission in our community. All issues noted in this document were discussed and addressed.  A physical exam was not performed with this format.  I connected with Tyler Duran on 01/09/20 at 8:00 by telephone and verified that I am speaking with the correct person using two identifiers. Tyler Duran is currently located at work and no one is currently with him during visit. The provider, Mary-Margaret Hassell Done, FNP is located in their office at time of visit.  I discussed the limitations, risks, security and privacy concerns of performing an evaluation and management service by telephone and the availability of in person appointments. I also discussed with the patient that there may be a patient responsible charge related to this service. The patient expressed understanding and agreed to proceed.   History and Present Illness:  Patient ID: Tyler Duran, male    DOB: Oct 11, 1970, 49 y.o.   MRN: [id number]   Chief Complaint: Medical Management of Chronic Issues    HPI:  1. Essential hypertension No c/o chest pain, sob or headache. Does not check blood pressure at ome. BP Readings from Last 3 Encounters:  09/25/19 118/73  06/27/19 101/62  03/09/19 (!) 95/52     2. Hyperlipidemia with target LDL less than 100 Does try to  Watch diet and does little to no exercise. Lab Results  Component Value Date   CHOL 124 01/08/2020   HDL 33 (L) 01/08/2020   LDLCALC 61 01/08/2020   TRIG 178 (H) 01/08/2020   CHOLHDL 3.8 01/08/2020     3. Type 2 diabetes mellitus without complication, without long-term current use of insulin (HCC) Blood sugars have been below 140 fasting. No symptoms  of low blood sugars. Lab Results  Component Value Date   HGBA1C 6.8 01/08/2020     4. Acquired hypothyroidism No problems that aware of Lab Results  Component Value Date   TSH 3.300 05/22/2019     5. Morbid obesity ([place]) No recent weight changes Wt Readings from Last 3 Encounters:  09/25/19 267 lb (121.1 kg)  06/27/19 265 lb (120.2 kg)  03/09/19 270 lb (122.5 kg)   BMI Readings from Last 3 Encounters:  09/25/19 37.24 kg/m  06/27/19 36.96 kg/m  03/09/19 31.20 kg/m       Outpatient Encounter Medications as of 01/09/2020  Medication Sig   atorvastatin (LIPITOR) 40 MG tablet TAKE 1 TABLET (40 MG TOTAL) BY MOUTH DAILY.   glimepiride (AMARYL) 4 MG tablet Take 1 tablet (4 mg total) by mouth daily with breakfast.   glucose blood (ONETOUCH VERIO) test strip Check blood sugar every morning fasting and prn  Dx 250.02   indomethacin (INDOCIN) 50 MG capsule Take 1 capsule (50 mg total) by mouth 3 (three) times daily with meals as needed.   levothyroxine (SYNTHROID) 150 MCG tablet Take 1 tablet (150 mcg total) by mouth daily.   lisinopril-hydrochlorothiazide (ZESTORETIC) 10-12.5 MG tablet Take 1 tablet by mouth daily.   metFORMIN (GLUCOPHAGE) 1000 MG tablet Take 1 tablet (1,000 mg total) by mouth 2 (two) times daily with a meal.     Past Surgical History:  Procedure Laterality Date   APPENDECTOMY     CHOLECYSTECTOMY N/A 07/21/2013   Procedure: LAPAROSCOPIC CHOLECYSTECTOMY;  Surgeon: Jamesetta So, MD;  Location: [facility];  Service: General;  Laterality: N/A;    Family History  Problem Relation Age of Onset   Cancer Mother    Diabetes Father    Colon polyps Father    Colon cancer Neg Hx    Esophageal cancer Neg Hx    Rectal cancer Neg Hx    Stomach cancer Neg Hx     New complaints: None today  Social history: Lives with wife. Is adopting his daughters kids  Controlled substance contract: n/a    Review of Systems  Constitutional: Negative for  diaphoresis.  Eyes: Negative for pain.  Respiratory: Negative for shortness of breath.   Cardiovascular: Negative for chest pain, palpitations and leg swelling.  Gastrointestinal: Negative for abdominal pain.  Endocrine: Negative for polydipsia.  Skin: Negative for rash.  Neurological: Negative for dizziness, weakness and headaches.  Hematological: Does not bruise/bleed easily.  All other systems reviewed and are negative.      Observations/Objective: Alert and oriented- answers all questions appropriately No distress    Assessment and Plan: Tyler Duran comes in today with chief complaint of Medical Management of Chronic Issues   Diagnosis and orders addressed:  1. Essential hypertension Continue low salt diet - lisinopril-hydrochlorothiazide (ZESTORETIC) 10-12.5 MG tablet; Take 1 tablet by mouth daily.  Dispense: 90 tablet; Refill: 1  2. Hyperlipidemia with target LDL less than 100 Low fat diet - atorvastatin (LIPITOR) 40 MG tablet; TAKE 1 TABLET (40 MG TOTAL) BY MOUTH DAILY.  Dispense: 90 tablet; Refill: 1  3. Type 2 diabetes mellitus without complication, without long-term current use of insulin (HCC) Continue to watch carbs in diet Check blood sugars fasting daily - glimepiride (AMARYL) 4 MG tablet; Take 1 tablet (4 mg total) by mouth daily with breakfast.  Dispense: 90 tablet; Refill: 1 - metFORMIN (GLUCOPHAGE) 1000 MG tablet; Take 1 tablet (1,000 mg total) by mouth 2 (two) times daily with a meal.  Dispense: 180 tablet; Refill: 1  4. Acquired hypothyroidism Lab results discussed at appoit - levothyroxine (SYNTHROID) 150 MCG tablet; Take 1 tablet (150 mcg total) by mouth daily.  Dispense: 90 tablet; Refill: 3  5. Morbid obesity ([place]) Discussed diet and exercise for person with BMI >25 Will recheck weight in 3-6 months  6. Acute idiopathic gout of left ankle - indomethacin (INDOCIN) 50 MG capsule; Take 1 capsule (50 mg total) by mouth 3 (three) times daily  with meals as needed.  Dispense: 30 capsule; Refill: 3   Labs pending Health Maintenance reviewed Diet and exercise encouraged  Follow up plan: 3 months    I discussed the assessment and treatment plan with the patient. The patient was provided an opportunity to ask questions and all were answered. The patient agreed with the plan and demonstrated an understanding of the instructions.   The patient was advised to call back or seek an in-person evaluation if the symptoms worsen or if the condition fails to improve as anticipated.  The above assessment and management plan was discussed with the patient. The patient verbalized understanding of and has agreed to the management plan. Patient is aware to call the clinic if symptoms persist or worsen. Patient is aware when to return to the clinic for a follow-up visit. Patient educated on when it is appropriate to go to the emergency department.   Time call ended:  8:0  I provided 20 minutes of non-face-to-face time during this encounter.    Mary-Margaret Hassell Done, FNP

## 2020-01-25 ENCOUNTER — Ambulatory Visit: Payer: BC Managed Care – PPO | Attending: Internal Medicine

## 2020-01-25 DIAGNOSIS — Z23 Encounter for immunization: Secondary | ICD-10-CM

## 2020-01-25 NOTE — Progress Notes (Signed)
   Covid-19 Vaccination Clinic  Name:  Tyler Duran    MRN: [id number] DOB: 05/14/1971  01/25/2020  [name] was observed post Covid-19 immunization for 15 minutes without incident. He was provided with Vaccine Information Sheet and instruction to access the V-Safe system.   [name] was instructed to call 911 with any severe reactions post vaccine: Marland Kitchen Difficulty breathing  . Swelling of face and throat  . A fast heartbeat  . A bad rash all over body  . Dizziness and weakness   Immunizations Administered    Name Date Dose VIS Date Route   Pfizer COVID-19 Vaccine 01/25/2020  4:18 PM 0.3 mL 08/02/2018 Intramuscular   Manufacturer: [place]   Lot: [id number]   [place]: [address]-5

## 2020-02-29 ENCOUNTER — Ambulatory Visit: Payer: BC Managed Care – PPO

## 2020-07-06 ENCOUNTER — Other Ambulatory Visit: Payer: Self-pay | Admitting: Nurse Practitioner

## 2020-07-06 DIAGNOSIS — E119 Type 2 diabetes mellitus without complications: Secondary | ICD-10-CM

## 2020-08-01 ENCOUNTER — Other Ambulatory Visit: Payer: Self-pay | Admitting: Nurse Practitioner

## 2020-08-01 DIAGNOSIS — I10 Essential (primary) hypertension: Secondary | ICD-10-CM

## 2020-08-01 DIAGNOSIS — E785 Hyperlipidemia, unspecified: Secondary | ICD-10-CM

## 2020-08-11 ENCOUNTER — Other Ambulatory Visit: Payer: Self-pay | Admitting: Nurse Practitioner

## 2020-08-11 DIAGNOSIS — E119 Type 2 diabetes mellitus without complications: Secondary | ICD-10-CM

## 2020-08-12 ENCOUNTER — Other Ambulatory Visit: Payer: Self-pay | Admitting: Nurse Practitioner

## 2020-08-12 DIAGNOSIS — E119 Type 2 diabetes mellitus without complications: Secondary | ICD-10-CM

## 2020-08-20 ENCOUNTER — Ambulatory Visit: Payer: BC Managed Care – PPO | Admitting: Nurse Practitioner

## 2020-08-20 ENCOUNTER — Other Ambulatory Visit: Payer: Self-pay

## 2020-08-20 ENCOUNTER — Encounter: Payer: Self-pay | Admitting: Nurse Practitioner

## 2020-08-20 VITALS — BP 114/75 | HR 101 | Temp 97.8°F | Resp 20 | Ht 71.0 in | Wt 261.0 lb

## 2020-08-20 DIAGNOSIS — E039 Hypothyroidism, unspecified: Secondary | ICD-10-CM

## 2020-08-20 DIAGNOSIS — E785 Hyperlipidemia, unspecified: Secondary | ICD-10-CM | POA: Diagnosis not present

## 2020-08-20 DIAGNOSIS — I10 Essential (primary) hypertension: Secondary | ICD-10-CM

## 2020-08-20 DIAGNOSIS — E119 Type 2 diabetes mellitus without complications: Secondary | ICD-10-CM

## 2020-08-20 DIAGNOSIS — M10072 Idiopathic gout, left ankle and foot: Secondary | ICD-10-CM

## 2020-08-20 LAB — BAYER DCA HB A1C WAIVED: HB A1C (BAYER DCA - WAIVED): 7.7 % — ABNORMAL HIGH (ref <7.0)

## 2020-08-20 MED ORDER — LISINOPRIL-HYDROCHLOROTHIAZIDE 10-12.5 MG PO TABS: 1.0000 | ORAL_TABLET | Freq: Every day | ORAL | 1 refills | Status: DC

## 2020-08-20 MED ORDER — GLIMEPIRIDE 4 MG PO TABS: ORAL_TABLET | ORAL | 1 refills | Status: DC

## 2020-08-20 MED ORDER — METFORMIN HCL 1000 MG PO TABS: ORAL_TABLET | ORAL | 1 refills | Status: DC

## 2020-08-20 MED ORDER — LEVOTHYROXINE SODIUM 150 MCG PO TABS
150.0000 ug | ORAL_TABLET | Freq: Every day | ORAL | 3 refills | Status: DC
Start: 2020-08-20 — End: 2020-12-13

## 2020-08-20 MED ORDER — INDOMETHACIN 50 MG PO CAPS: 50.0000 mg | ORAL_CAPSULE | Freq: Three times a day (TID) | ORAL | 3 refills | Status: DC | PRN

## 2020-08-20 MED ORDER — ATORVASTATIN CALCIUM 40 MG PO TABS: ORAL_TABLET | ORAL | 1 refills | Status: DC

## 2020-08-20 NOTE — Patient Instructions (Signed)

## 2020-08-20 NOTE — Progress Notes (Signed)
Subjective:    Patient ID: Tyler Duran, male    DOB: Apr 13, 1971, 50 y.o.   MRN: [id number]   Chief Complaint: Medical Management of Chronic Issues    HPI:  1. Type 2 diabetes mellitus without complication, without long-term current use of insulin (HCC) Taking Metformin and glimepiride as prescribed. Nothing has changed in his diet, is not exercising regularly. Does not monitor FBG regularly. No issues with low blood sugars.     Lab Results  Component Value Date   HGBA1C 6.8 01/08/2020    2. Acquired hypothyroidism Tolerating Snythroid, tolerating well. He has been feeling well.   3. Hyperlipidemia with target LDL less than 100 Taking atorvastatin. Does not watch diet for cholesterol management.   4. Primary hypertension Tolerating Zestorietic. Tolerating well. Denies HA, SOB, CP, dizziness, syncope. Does not monitor BP at home.   5. Hypothyroidism, unspecified type     Outpatient Encounter Medications as of 08/20/2020  Medication Sig  . atorvastatin (LIPITOR) 40 MG tablet TAKE 1 TABLET(40 MG) BY MOUTH DAILY (NEEDS TO BE SEEN BEFORE NEXT REFILL)  . glimepiride (AMARYL) 4 MG tablet TAKE 1 TABLET(4 MG) BY MOUTH DAILY WITH BREAKFAST  . glucose blood (ONETOUCH VERIO) test strip Check blood sugar every morning fasting and prn  Dx 250.02  . levothyroxine (SYNTHROID) 150 MCG tablet Take 1 tablet (150 mcg total) by mouth daily.  Marland Kitchen lisinopril-hydrochlorothiazide (ZESTORETIC) 10-12.5 MG tablet Take 1 tablet by mouth daily. (NEEDS TO BE SEEN BEFORE NEXT REFILL)  . metFORMIN (GLUCOPHAGE) 1000 MG tablet TAKE 1 TABLET(1000 MG) BY MOUTH TWICE DAILY WITH A MEAL  . indomethacin (INDOCIN) 50 MG capsule Take 1 capsule (50 mg total) by mouth 3 (three) times daily with meals as needed. (Patient not taking: Reported on 08/20/2020)   No facility-administered encounter medications on file as of 08/20/2020.    Past Surgical History:  Procedure Laterality Date  . APPENDECTOMY    .  CHOLECYSTECTOMY N/A 07/21/2013   Procedure: LAPAROSCOPIC CHOLECYSTECTOMY;  Surgeon: Jamesetta So, MD;  Location: [facility];  Service: General;  Laterality: N/A;    Family History  Problem Relation Age of Onset  . Cancer Mother   . Diabetes Father   . Colon polyps Father   . Colon cancer Neg Hx   . Esophageal cancer Neg Hx   . Rectal cancer Neg Hx   . Stomach cancer Neg Hx     New complaints: None today  Social history: Lives with wife. 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  Respiratory: Negative for cough, shortness of breath and wheezing.   Cardiovascular: Negative for chest pain and palpitations.  Gastrointestinal: Negative for constipation and diarrhea.  Genitourinary: Negative for difficulty urinating and dysuria.  Neurological: Negative for dizziness, light-headedness and numbness.  Psychiatric/Behavioral: The patient is not nervous/anxious.        Objective:   Physical Exam Constitutional:      Appearance: He is obese.  Neck:     Vascular: No carotid bruit.  Cardiovascular:     Rate and Rhythm: Normal rate and regular rhythm.     Pulses: Normal pulses.     Heart sounds: Normal heart sounds.  Pulmonary:     Effort: Pulmonary effort is normal.     Breath sounds: Normal breath sounds.  Abdominal:     General: Bowel sounds are normal.     Palpations: Abdomen is soft.  Musculoskeletal:        General: Normal range of motion.     Cervical back: Normal range of motion and neck supple.  Skin:    General: Skin is warm and dry.     Capillary Refill: Capillary refill takes less than 2 seconds.  Neurological:     Mental Status: He is alert and oriented to person, place, and time.  Psychiatric:        Mood and Affect: Mood normal.        Behavior: Behavior normal.    Vitals:   08/20/20 1447  BP: 114/75  Pulse: (!) 101  Resp: 20  Temp: 97.8 F (36.6  C)  SpO2: 94%   Today's A1c 7.7%      Assessment & Plan:  Tyler Duran comes in today with chief complaint of Medical Management of Chronic Issues   Diagnosis and orders addressed:  1. Type 2 diabetes mellitus without complication, without long-term current use of insulin (HCC) Stricter carb cunting - Bayer DCA Hb A1c Waived - metFORMIN (GLUCOPHAGE) 1000 MG tablet; TAKE 1 TABLET(1000 MG) BY MOUTH TWICE DAILY WITH A MEAL  Dispense: 180 tablet; Refill: 1 - glimepiride (AMARYL) 4 MG tablet; TAKE 1 TABLET(4 MG) BY MOUTH DAILY WITH BREAKFAST  Dispense: 90 tablet; Refill: 1 - lisinopril-hydrochlorothiazide (ZESTORETIC) 10-12.5 MG tablet; Take 1 tablet by mouth daily. (NEEDS TO BE SEEN BEFORE NEXT REFILL)  Dispense: 90 tablet; Refill: 1  2. Acquired hypothyroidism Labs pending - Thyroid Panel With TSH - levothyroxine (SYNTHROID) 150 MCG tablet; Take 1 tablet (150 mcg total) by mouth daily.  Dispense: 90 tablet; Refill: 3  3. Hyperlipidemia with target LDL less than 100 Low fat diet - Lipid panel - atorvastatin (LIPITOR) 40 MG tablet; TAKE 1 TABLET(40 MG) BY MOUTH DAILY (NEEDS TO BE SEEN BEFORE NEXT REFILL)  Dispense: 90 tablet; Refill: 1  4. Primary hypertension Low sodium diet - CMP14+EGFR - CBC with Differential/Platelet  5.  Acute idiopathic gout of left ankle - indomethacin (INDOCIN) 50 MG capsule; Take 1 capsule (50 mg total) by mouth 3 (three) times daily with meals as needed.  Dispense: 30 capsule; Refill: 3    Labs pending Health Maintenance reviewed Diet and exercise encouraged  Follow up plan: 3 months   Mary-Margaret Hassell Done, FNP

## 2020-08-21 LAB — LIPID PANEL
Chol/HDL Ratio: 3.3 ratio (ref 0.0–5.0)
Cholesterol, Total: 120 mg/dL (ref 100–199)
HDL: 36 mg/dL — ABNORMAL LOW (ref >39)
LDL Chol Calc (NIH): 57 mg/dL (ref 0–99)
Triglycerides: 157 mg/dL — ABNORMAL HIGH (ref 0–149)
VLDL Cholesterol Cal: 27 mg/dL (ref 5–40)

## 2020-08-21 LAB — THYROID PANEL WITH TSH
Free Thyroxine Index: 2.1 (ref 1.2–4.9)
T3 Uptake Ratio: 25 % (ref 24–39)
T4, Total: 8.2 ug/dL (ref 4.5–12.0)
TSH: 3.09 u[IU]/mL (ref 0.450–4.500)

## 2020-08-21 LAB — CBC WITH DIFFERENTIAL/PLATELET
Basophils Absolute: 0.1 10*3/uL (ref 0.0–0.2)
Basos: 1 %
EOS (ABSOLUTE): 0.6 10*3/uL — ABNORMAL HIGH (ref 0.0–0.4)
Eos: 6 %
Hematocrit: 43.4 % (ref 37.5–51.0)
Hemoglobin: 14 g/dL (ref 13.0–17.7)
Immature Grans (Abs): 0.1 10*3/uL (ref 0.0–0.1)
Immature Granulocytes: 1 %
Lymphocytes Absolute: 3.3 10*3/uL — ABNORMAL HIGH (ref 0.7–3.1)
Lymphs: 30 %
MCH: 26.6 pg (ref 26.6–33.0)
MCHC: 32.3 g/dL (ref 31.5–35.7)
MCV: 82 fL (ref 79–97)
Monocytes Absolute: 1 10*3/uL — ABNORMAL HIGH (ref 0.1–0.9)
Monocytes: 9 %
Neutrophils Absolute: 5.9 10*3/uL (ref 1.4–7.0)
Neutrophils: 53 %
Platelets: 264 10*3/uL (ref 150–450)
RBC: 5.27 x10E6/uL (ref 4.14–5.80)
RDW: 13.5 % (ref 11.6–15.4)
WBC: 11 10*3/uL — ABNORMAL HIGH (ref 3.4–10.8)

## 2020-08-21 LAB — CMP14+EGFR
ALT: 32 IU/L (ref 0–44)
AST: 21 IU/L (ref 0–40)
Albumin/Globulin Ratio: 1.6 (ref 1.2–2.2)
Albumin: 4.4 g/dL (ref 3.8–4.9)
Alkaline Phosphatase: 97 IU/L (ref 44–121)
BUN/Creatinine Ratio: 15 (ref 9–20)
BUN: 16 mg/dL (ref 6–24)
Bilirubin Total: 0.4 mg/dL (ref 0.0–1.2)
CO2: 23 mmol/L (ref 20–29)
Calcium: 10 mg/dL (ref 8.7–10.2)
Chloride: 100 mmol/L (ref 96–106)
Creatinine, Ser: 1.1 mg/dL (ref 0.76–1.27)
Globulin, Total: 2.7 g/dL (ref 1.5–4.5)
Glucose: 114 mg/dL — ABNORMAL HIGH (ref 65–99)
Potassium: 5 mmol/L (ref 3.5–5.2)
Sodium: 141 mmol/L (ref 134–144)
Total Protein: 7.1 g/dL (ref 6.0–8.5)
eGFR: 81 mL/min/{1.73_m2} (ref >59)

## 2020-09-02 ENCOUNTER — Other Ambulatory Visit: Payer: Self-pay | Admitting: Nurse Practitioner

## 2020-09-02 DIAGNOSIS — E119 Type 2 diabetes mellitus without complications: Secondary | ICD-10-CM

## 2020-09-02 DIAGNOSIS — E785 Hyperlipidemia, unspecified: Secondary | ICD-10-CM

## 2020-11-20 ENCOUNTER — Ambulatory Visit: Payer: Self-pay | Admitting: Nurse Practitioner

## 2020-11-25 ENCOUNTER — Encounter: Payer: Self-pay | Admitting: Nurse Practitioner

## 2020-12-13 ENCOUNTER — Encounter: Payer: Self-pay | Admitting: Nurse Practitioner

## 2020-12-13 ENCOUNTER — Ambulatory Visit: Payer: BC Managed Care – PPO | Admitting: Nurse Practitioner

## 2020-12-13 ENCOUNTER — Other Ambulatory Visit: Payer: Self-pay

## 2020-12-13 VITALS — BP 98/67 | HR 73 | Temp 97.4°F | Resp 20 | Ht 71.0 in | Wt 261.0 lb

## 2020-12-13 DIAGNOSIS — E785 Hyperlipidemia, unspecified: Secondary | ICD-10-CM

## 2020-12-13 DIAGNOSIS — E039 Hypothyroidism, unspecified: Secondary | ICD-10-CM | POA: Diagnosis not present

## 2020-12-13 DIAGNOSIS — E119 Type 2 diabetes mellitus without complications: Secondary | ICD-10-CM

## 2020-12-13 DIAGNOSIS — I10 Essential (primary) hypertension: Secondary | ICD-10-CM | POA: Diagnosis not present

## 2020-12-13 DIAGNOSIS — M10072 Idiopathic gout, left ankle and foot: Secondary | ICD-10-CM

## 2020-12-13 LAB — BAYER DCA HB A1C WAIVED: HB A1C (BAYER DCA - WAIVED): 7.1 % — ABNORMAL HIGH (ref <7.0)

## 2020-12-13 MED ORDER — ATORVASTATIN CALCIUM 40 MG PO TABS: ORAL_TABLET | ORAL | 1 refills | Status: DC

## 2020-12-13 MED ORDER — GLIMEPIRIDE 4 MG PO TABS: ORAL_TABLET | ORAL | 1 refills | Status: DC

## 2020-12-13 MED ORDER — METFORMIN HCL 1000 MG PO TABS: ORAL_TABLET | ORAL | 1 refills | Status: DC

## 2020-12-13 MED ORDER — LISINOPRIL-HYDROCHLOROTHIAZIDE 10-12.5 MG PO TABS: 1.0000 | ORAL_TABLET | Freq: Every day | ORAL | 1 refills | Status: DC

## 2020-12-13 MED ORDER — LEVOTHYROXINE SODIUM 150 MCG PO TABS
150.0000 ug | ORAL_TABLET | Freq: Every day | ORAL | 1 refills | Status: DC
Start: 2020-12-13 — End: 2021-07-10

## 2020-12-13 NOTE — Progress Notes (Signed)
Subjective:    Patient ID: Tyler Duran, male    DOB: 1970/10/13, 50 y.o.   MRN: [id number]   Chief Complaint: Medical Management of Chronic Issues    HPI:  1. Primary hypertension No c/o chest pain, sob or headache. Does not check blood pressure at home. BP Readings from Last 3 Encounters:  08/20/20 114/75  09/25/19 118/73  06/27/19 101/62     2. Hyperlipidemia with target LDL less than 100 Does not really watch diet and does no dedicated exercise. Lab Results  Component Value Date   CHOL 120 08/20/2020   HDL 36 (L) 08/20/2020   LDLCALC 57 08/20/2020   TRIG 157 (H) 08/20/2020   CHOLHDL 3.3 08/20/2020     3. Acquired hypothyroidism No problems that aware of. Lab Results  Component Value Date   TSH 3.090 08/20/2020     4. Type 2 diabetes mellitus without complication, without long-term current use of insulin (HCC) He does not check his fating blood sugars very often. Lab Results  Component Value Date   HGBA1C 7.7 (H) 08/20/2020     5. Acute idiopathic gout of left ankle No recent gout flare ups  6. Morbid obesity (HCC) No recent weight changes Wt Readings from Last 3 Encounters:  12/13/20 261 lb (118.4 kg)  08/20/20 261 lb (118.4 kg)  09/25/19 267 lb (121.1 kg)   BMI Readings from Last 3 Encounters:  12/13/20 36.40 kg/m  08/20/20 36.40 kg/m  09/25/19 37.24 kg/m       Outpatient Encounter Medications as of 12/13/2020  Medication Sig   atorvastatin (LIPITOR) 40 MG tablet TAKE 1 TABLET(40 MG) BY MOUTH DAILY (NEEDS TO BE SEEN BEFORE NEXT REFILL)   glimepiride (AMARYL) 4 MG tablet TAKE 1 TABLET(4 MG) BY MOUTH DAILY WITH BREAKFAST   glucose blood (ONETOUCH VERIO) test strip Check blood sugar every morning fasting and prn  Dx 250.02   indomethacin (INDOCIN) 50 MG capsule Take 1 capsule (50 mg total) by mouth 3 (three) times daily with meals as needed.   levothyroxine (SYNTHROID) 150 MCG tablet Take 1 tablet (150 mcg total) by mouth daily.    lisinopril-hydrochlorothiazide (ZESTORETIC) 10-12.5 MG tablet Take 1 tablet by mouth daily. (NEEDS TO BE SEEN BEFORE NEXT REFILL)   metFORMIN (GLUCOPHAGE) 1000 MG tablet TAKE 1 TABLET(1000 MG) BY MOUTH TWICE DAILY WITH A MEAL   No facility-administered encounter medications on file as of 12/13/2020.    Past Surgical History:  Procedure Laterality Date   APPENDECTOMY     CHOLECYSTECTOMY N/A 07/21/2013   Procedure: LAPAROSCOPIC CHOLECYSTECTOMY;  Surgeon: Dalia Heading, MD;  Location: [facility];  Service: General;  Laterality: N/A;    Family History  Problem Relation Age of Onset   Cancer Mother    Diabetes Father    Colon polyps Father    Colon cancer Neg Hx    Esophageal cancer Neg Hx    Rectal cancer Neg Hx    Stomach cancer Neg Hx     New complaints: None today  Social history: Lives with wife and kids  Controlled substance contract: n/a     Review of Systems  Constitutional:  Negative for diaphoresis.  Eyes:  Negative for pain.  Respiratory:  Negative for shortness of breath.   Cardiovascular:  Negative for chest pain, palpitations and leg swelling.  Gastrointestinal:  Negative for abdominal pain.  Endocrine: Negative for polydipsia.  Skin:  Negative for rash.  Neurological:  Negative for dizziness, weakness and headaches.  Hematological:  Does  not bruise/bleed easily.  All other systems reviewed and are negative.     Objective:   Physical Exam Vitals and nursing note reviewed.  Constitutional:      Appearance: Normal appearance. He is well-developed.  HENT:     Head: Normocephalic.     Nose: Nose normal.  Eyes:     Pupils: Pupils are equal, round, and reactive to light.  Neck:     Thyroid: No thyroid mass or thyromegaly.     Vascular: No carotid bruit or JVD.     Trachea: Phonation normal.  Cardiovascular:     Rate and Rhythm: Normal rate and regular rhythm.  Pulmonary:     Effort: Pulmonary effort is normal. No respiratory distress.     Breath sounds:  Normal breath sounds.  Abdominal:     General: Bowel sounds are normal.     Palpations: Abdomen is soft.     Tenderness: There is no abdominal tenderness.  Musculoskeletal:        General: Normal range of motion.     Cervical back: Normal range of motion and neck supple.  Lymphadenopathy:     Cervical: No cervical adenopathy.  Skin:    General: Skin is warm and dry.  Neurological:     Mental Status: He is alert and oriented to person, place, and time.  Psychiatric:        Behavior: Behavior normal.        Thought Content: Thought content normal.        Judgment: Judgment normal.    BP 98/67   Pulse 73   Temp (!) 97.4 F (36.3 C) (Temporal)   Resp 20   Ht $R'5\' 11"'uj$  (1.803 m)   Wt 261 lb (118.4 kg)   SpO2 94%   BMI 36.40 kg/m    HGBA1c 7.1%    Assessment & Plan:  Tyler Duran comes in today with chief complaint of Medical Management of Chronic Issues   Diagnosis and orders addressed:  1. Primary hypertension Low sodium diet - CBC with Differential/Platelet - CMP14+EGFR  2. Hyperlipidemia with target LDL less than 100 Low fat diet - Lipid panel - atorvastatin (LIPITOR) 40 MG tablet; TAKE 1 TABLET(40 MG) BY MOUTH DAILY (NEEDS TO BE SEEN BEFORE NEXT REFILL)  Dispense: 90 tablet; Refill: 1  3. Acquired hypothyroidism Labs pending - Thyroid Panel With TSH - levothyroxine (SYNTHROID) 150 MCG tablet; Take 1 tablet (150 mcg total) by mouth daily.  Dispense: 90 tablet; Refill: 1  4. Type 2 diabetes mellitus without complication, without long-term current use of insulin (HCC) Continue to watch carbs in diet - Bayer DCA Hb A1c Waived - lisinopril-hydrochlorothiazide (ZESTORETIC) 10-12.5 MG tablet; Take 1 tablet by mouth daily. (NEEDS TO BE SEEN BEFORE NEXT REFILL)  Dispense: 90 tablet; Refill: 1 - metFORMIN (GLUCOPHAGE) 1000 MG tablet; TAKE 1 TABLET(1000 MG) BY MOUTH TWICE DAILY WITH A MEAL  Dispense: 180 tablet; Refill: 1 - glimepiride (AMARYL) 4 MG tablet; TAKE 1  TABLET(4 MG) BY MOUTH DAILY WITH BREAKFAST  Dispense: 90 tablet; Refill: 1  5. Acute idiopathic gout of left ankle Low purine diet  6. Morbid obesity ([place]) Discussed diet and exercise for person with BMI >25 Will recheck weight in 3-6 months    Labs pending Health Maintenance reviewed Diet and exercise encouraged  Follow up plan: 3 months   Mary-Margaret Hassell Done,

## 2020-12-13 NOTE — Patient Instructions (Signed)

## 2020-12-14 LAB — CBC WITH DIFFERENTIAL/PLATELET
Basophils Absolute: 0.1 10*3/uL (ref 0.0–0.2)
Basos: 1 %
EOS (ABSOLUTE): 0.5 10*3/uL — ABNORMAL HIGH (ref 0.0–0.4)
Eos: 5 %
Hematocrit: 42.6 % (ref 37.5–51.0)
Hemoglobin: 13.8 g/dL (ref 13.0–17.7)
Immature Grans (Abs): 0 10*3/uL (ref 0.0–0.1)
Immature Granulocytes: 0 %
Lymphocytes Absolute: 3.3 10*3/uL — ABNORMAL HIGH (ref 0.7–3.1)
Lymphs: 31 %
MCH: 26.6 pg (ref 26.6–33.0)
MCHC: 32.4 g/dL (ref 31.5–35.7)
MCV: 82 fL (ref 79–97)
Monocytes Absolute: 1 10*3/uL — ABNORMAL HIGH (ref 0.1–0.9)
Monocytes: 9 %
Neutrophils Absolute: 6 10*3/uL (ref 1.4–7.0)
Neutrophils: 54 %
Platelets: 283 10*3/uL (ref 150–450)
RBC: 5.19 x10E6/uL (ref 4.14–5.80)
RDW: 13.4 % (ref 11.6–15.4)
WBC: 11 10*3/uL — ABNORMAL HIGH (ref 3.4–10.8)

## 2020-12-14 LAB — LIPID PANEL
Chol/HDL Ratio: 3.6 ratio (ref 0.0–5.0)
Cholesterol, Total: 118 mg/dL (ref 100–199)
HDL: 33 mg/dL — ABNORMAL LOW (ref >39)
LDL Chol Calc (NIH): 59 mg/dL (ref 0–99)
Triglycerides: 148 mg/dL (ref 0–149)
VLDL Cholesterol Cal: 26 mg/dL (ref 5–40)

## 2020-12-14 LAB — CMP14+EGFR
ALT: 29 IU/L (ref 0–44)
AST: 18 IU/L (ref 0–40)
Albumin/Globulin Ratio: 1.7 (ref 1.2–2.2)
Albumin: 4.4 g/dL (ref 3.8–4.9)
Alkaline Phosphatase: 97 IU/L (ref 44–121)
BUN/Creatinine Ratio: 14 (ref 9–20)
BUN: 15 mg/dL (ref 6–24)
Bilirubin Total: 0.4 mg/dL (ref 0.0–1.2)
CO2: 23 mmol/L (ref 20–29)
Calcium: 9.4 mg/dL (ref 8.7–10.2)
Chloride: 101 mmol/L (ref 96–106)
Creatinine, Ser: 1.08 mg/dL (ref 0.76–1.27)
Globulin, Total: 2.6 g/dL (ref 1.5–4.5)
Glucose: 150 mg/dL — ABNORMAL HIGH (ref 65–99)
Potassium: 4.6 mmol/L (ref 3.5–5.2)
Sodium: 139 mmol/L (ref 134–144)
Total Protein: 7 g/dL (ref 6.0–8.5)
eGFR: 83 mL/min/{1.73_m2} (ref >59)

## 2020-12-14 LAB — THYROID PANEL WITH TSH
Free Thyroxine Index: 2.5 (ref 1.2–4.9)
T3 Uptake Ratio: 28 % (ref 24–39)
T4, Total: 8.9 ug/dL (ref 4.5–12.0)
TSH: 1.97 u[IU]/mL (ref 0.450–4.500)

## 2020-12-17 ENCOUNTER — Ambulatory Visit: Payer: BC Managed Care – PPO | Admitting: Nurse Practitioner

## 2021-03-13 ENCOUNTER — Other Ambulatory Visit: Payer: Self-pay | Admitting: Nurse Practitioner

## 2021-03-13 DIAGNOSIS — E119 Type 2 diabetes mellitus without complications: Secondary | ICD-10-CM

## 2021-03-13 DIAGNOSIS — E785 Hyperlipidemia, unspecified: Secondary | ICD-10-CM

## 2021-04-01 ENCOUNTER — Ambulatory Visit: Payer: Self-pay | Admitting: Nurse Practitioner

## 2021-04-09 ENCOUNTER — Ambulatory Visit: Payer: BC Managed Care – PPO | Admitting: Nurse Practitioner

## 2021-04-09 ENCOUNTER — Other Ambulatory Visit: Payer: Self-pay

## 2021-04-09 ENCOUNTER — Encounter: Payer: Self-pay | Admitting: Nurse Practitioner

## 2021-04-09 VITALS — BP 120/76 | HR 78 | Temp 98.3°F | Resp 20 | Ht 71.0 in | Wt 268.0 lb

## 2021-04-09 DIAGNOSIS — E119 Type 2 diabetes mellitus without complications: Secondary | ICD-10-CM | POA: Diagnosis not present

## 2021-04-09 DIAGNOSIS — Z23 Encounter for immunization: Secondary | ICD-10-CM | POA: Diagnosis not present

## 2021-04-09 DIAGNOSIS — Z125 Encounter for screening for malignant neoplasm of prostate: Secondary | ICD-10-CM | POA: Diagnosis not present

## 2021-04-09 DIAGNOSIS — E039 Hypothyroidism, unspecified: Secondary | ICD-10-CM

## 2021-04-09 DIAGNOSIS — E785 Hyperlipidemia, unspecified: Secondary | ICD-10-CM

## 2021-04-09 DIAGNOSIS — I10 Essential (primary) hypertension: Secondary | ICD-10-CM

## 2021-04-09 LAB — BAYER DCA HB A1C WAIVED: HB A1C (BAYER DCA - WAIVED): 8.2 % — ABNORMAL HIGH (ref 4.8–5.6)

## 2021-04-09 MED ORDER — LISINOPRIL-HYDROCHLOROTHIAZIDE 10-12.5 MG PO TABS: 1.0000 | ORAL_TABLET | Freq: Every day | ORAL | 1 refills | Status: DC

## 2021-04-09 MED ORDER — GLIMEPIRIDE 4 MG PO TABS: 4.0000 mg | ORAL_TABLET | Freq: Every day | ORAL | 1 refills | Status: DC

## 2021-04-09 MED ORDER — METFORMIN HCL 1000 MG PO TABS: ORAL_TABLET | ORAL | 1 refills | Status: DC

## 2021-04-09 MED ORDER — ATORVASTATIN CALCIUM 40 MG PO TABS: ORAL_TABLET | ORAL | 0 refills | Status: DC

## 2021-04-09 NOTE — Progress Notes (Signed)
Subjective:    Patient ID: Tyler Duran, male    DOB: [birth year], 50 y.o.   MRN: [id number]   Chief Complaint: Medical Management of Chronic Issues    HPI:  1. Screening for prostate cancer No problems voiding Lab Results  Component Value Date   PSA1 0.5 01/31/2019   PSA1 0.4 08/10/2017   PSA 0.4 07/18/2014      2. Type 2 diabetes mellitus without complication, without long-term current use of insulin (HCC) Fasting blood sugars are in the 120 when he checks them. No changes were made at last visit. Lab Results  Component Value Date   HGBA1C 8.2 (H) 04/09/2021     3. Hyperlipidemia with target LDL less than 100 Does not watch diet very strictly. Does no dedicated exercise.  Lab Results  Component Value Date   CHOL 118 12/13/2020   HDL 33 (L) 12/13/2020   LDLCALC 59 12/13/2020   TRIG 148 12/13/2020   CHOLHDL 3.6 12/13/2020     4. Primary hypertension No c/o chest pain, sob or headache. Does not check blood pressure. BP Readings from Last 3 Encounters:  04/09/21 120/76  12/13/20 98/67  08/20/20 114/75      5. Acquired hypothyroidism No problems that aware of.  6. Morbid obesity ([place]) Weight is up 7 lbs Wt Readings from Last 3 Encounters:  04/09/21 268 lb (121.6 kg)  12/13/20 261 lb (118.4 kg)  08/20/20 261 lb (118.4 kg)   BMI Readings from Last 3 Encounters:  04/09/21 37.38 kg/m  12/13/20 36.40 kg/m  08/20/20 36.40 kg/m       Outpatient Encounter Medications as of 04/09/2021  Medication Sig   atorvastatin (LIPITOR) 40 MG tablet TAKE 1 TABLET(40 MG) BY MOUTH DAILY   glimepiride (AMARYL) 4 MG tablet TAKE 1 TABLET(4 MG) BY MOUTH DAILY WITH BREAKFAST   glucose blood (ONETOUCH VERIO) test strip Check blood sugar every morning fasting and prn  Dx 250.02   indomethacin (INDOCIN) 50 MG capsule Take 1 capsule (50 mg total) by mouth 3 (three) times daily with meals as needed.   levothyroxine (SYNTHROID) 150 MCG tablet Take 1 tablet (150 mcg total)  by mouth daily.   lisinopril-hydrochlorothiazide (ZESTORETIC) 10-12.5 MG tablet TAKE 1 TABLET BY MOUTH DAILY   metFORMIN (GLUCOPHAGE) 1000 MG tablet TAKE 1 TABLET(1000 MG) BY MOUTH TWICE DAILY WITH A MEAL   No facility-administered encounter medications on file as of 04/09/2021.    Past Surgical History:  Procedure Laterality Date   APPENDECTOMY     CHOLECYSTECTOMY N/A 07/21/2013   Procedure: LAPAROSCOPIC CHOLECYSTECTOMY;  Surgeon: Jamesetta So, MD;  Location: [facility];  Service: General;  Laterality: N/A;    Family History  Problem Relation Age of Onset   Cancer Mother    Diabetes Father    Colon polyps Father    Colon cancer Neg Hx    Esophageal cancer Neg Hx    Rectal cancer Neg Hx    Stomach cancer Neg Hx     New complaints: None today  Social history: Lives with wife and children  Controlled substance contract: n/a     Review of Systems  Constitutional:  Negative for diaphoresis.  Eyes:  Negative for pain.  Respiratory:  Negative for shortness of breath.   Cardiovascular:  Negative for chest pain, palpitations and leg swelling.  Gastrointestinal:  Negative for abdominal pain.  Endocrine: Negative for polydipsia.  Skin:  Negative for rash.  Neurological:  Negative for dizziness, weakness and headaches.  Hematological:  Does not bruise/bleed easily.  All other systems reviewed and are negative.     Objective:   Physical Exam Vitals and nursing note reviewed.  Constitutional:      Appearance: Normal appearance. He is well-developed.  HENT:     Head: Normocephalic.     Nose: Nose normal.  Eyes:     Pupils: Pupils are equal, round, and reactive to light.  Neck:     Thyroid: No thyroid mass or thyromegaly.     Vascular: No carotid bruit or JVD.     Trachea: Phonation normal.  Cardiovascular:     Rate and Rhythm: Normal rate and regular rhythm.  Pulmonary:     Effort: Pulmonary effort is normal. No respiratory distress.     Breath sounds: Normal breath  sounds.  Abdominal:     General: Bowel sounds are normal.     Palpations: Abdomen is soft.     Tenderness: There is no abdominal tenderness.  Musculoskeletal:        General: Normal range of motion.     Cervical back: Normal range of motion and neck supple.  Lymphadenopathy:     Cervical: No cervical adenopathy.  Skin:    General: Skin is warm and dry.  Neurological:     Mental Status: He is alert and oriented to person, place, and time.  Psychiatric:        Behavior: Behavior normal.        Thought Content: Thought content normal.        Judgment: Judgment normal.   BP 120/76   Pulse 78   Temp 98.3 F (36.8 C) (Temporal)   Resp 20   Ht $R'5\' 11"'IX$  (1.803 m)   Wt 268 lb (121.6 kg)   SpO2 96%   BMI 37.38 kg/m   Hgba1c 8.2      Assessment & Plan:   Tyler Duran comes in today with chief complaint of Medical Management of Chronic Issues   Diagnosis and orders addressed:  1. Screening for prostate cancer Labs pending - PSA, total and free  2. Type 2 diabetes mellitus without complication, without long-term current use of insulin (HCC) Stricter carb counting May add ozempic or mounjario - Microalbumin / creatinine urine ratio - Bayer DCA Hb A1c Waived - lisinopril-hydrochlorothiazide (ZESTORETIC) 10-12.5 MG tablet; Take 1 tablet by mouth daily.  Dispense: 90 tablet; Refill: 1 - glimepiride (AMARYL) 4 MG tablet; Take 1 tablet (4 mg total) by mouth daily with breakfast.  Dispense: 90 tablet; Refill: 1 - metFORMIN (GLUCOPHAGE) 1000 MG tablet; TAKE 1 TABLET(1000 MG) BY MOUTH TWICE DAILY WITH A MEAL  Dispense: 180 tablet; Refill: 1  3. Hyperlipidemia with target LDL less than 100 Low fat diet - Lipid panel - atorvastatin (LIPITOR) 40 MG tablet; TAKE 1 TABLET(40 MG) BY MOUTH DAILY  Dispense: 90 tablet; Refill: 0  4. Primary hypertension Low sodium diet - CMP14+EGFR - CBC with Differential/Platelet  5. Acquired hypothyroidism Labs pending  6. Morbid obesity  ([place]) Discussed diet and exercise for person with BMI >25 Will recheck weight in 3-6 months    Labs pending Health Maintenance reviewed Diet and exercise encouraged  Follow up plan: 3 months   Mary-Margaret Hassell Done, FNP

## 2021-04-09 NOTE — Patient Instructions (Signed)
Carbohydrate Counting for Diabetes Mellitus, Adult Carbohydrate counting is a method of keeping track of how many carbohydrates you eat. Eating carbohydrates naturally increases the amount of sugar (glucose) in the blood. Counting how many carbohydrates you eat improves your blood glucose control, which helps you manage your diabetes. It is important to know how many carbohydrates you can safely have in each meal. This is different for every person. A dietitian can help you make a meal plan and calculate how many carbohydrates you should have at each meal and snack. What foods contain carbohydrates? Carbohydrates are found in the following foods: Grains, such as breads and cereals. Dried beans and soy products. Starchy vegetables, such as potatoes, peas, and corn. Fruit and fruit juices. Milk and yogurt. Sweets and snack foods, such as cake, cookies, candy, chips, and soft drinks. How do I count carbohydrates in foods? There are two ways to count carbohydrates in food. You can read food labels or learn standard serving sizes of foods. You can use either of the methods or a combination of both. Using the Nutrition Facts label The Nutrition Facts list is included on the labels of almost all packaged foods and beverages in the [place] It includes: The serving size. Information about nutrients in each serving, including the grams (g) of carbohydrate per serving. To use the Nutrition Facts: Decide how many servings you will have. Multiply the number of servings by the number of carbohydrates per serving. The resulting number is the total amount of carbohydrates that you will be having. Learning the standard serving sizes of foods When you eat carbohydrate foods that are not packaged or do not include Nutrition Facts on the label, you need to measure the servings in order to count the amount of carbohydrates. Measure the foods that you will eat with a food scale or measuring cup, if needed. Decide how  many standard-size servings you will eat. Multiply the number of servings by 15. For foods that contain carbohydrates, one serving equals 15 g of carbohydrates. For example, if you eat 2 cups or 10 oz (300 g) of strawberries, you will have eaten 2 servings and 30 g of carbohydrates (2 servings x 15 g = 30 g). For foods that have more than one food mixed, such as soups and casseroles, you must count the carbohydrates in each food that is included. The following list contains standard serving sizes of common carbohydrate-rich foods. Each of these servings has about 15 g of carbohydrates: 1 slice of bread. 1 six-inch (15 cm) tortilla. ? cup or 2 oz (53 g) cooked rice or pasta.  cup or 3 oz (85 g) cooked or canned, drained and rinsed beans or lentils.  cup or 3 oz (85 g) starchy vegetable, such as peas, corn, or squash.  cup or 4 oz (120 g) hot cereal.  cup or 3 oz (85 g) boiled or mashed potatoes, or  or 3 oz (85 g) of a large baked potato.  cup or 4 fl oz (118 mL) fruit juice. 1 cup or 8 fl oz (237 mL) milk. 1 small or 4 oz (106 g) apple.  or 2 oz (63 g) of a medium banana. 1 cup or 5 oz (150 g) strawberries. 3 cups or 1 oz (24 g) popped popcorn. What is an example of carbohydrate counting? To calculate the number of carbohydrates in this sample meal, follow the steps shown below. Sample meal 3 oz (85 g) chicken breast. ? cup or 4 oz (106 g) brown   rice.  cup or 3 oz (85 g) corn. 1 cup or 8 fl oz (237 mL) milk. 1 cup or 5 oz (150 g) strawberries with sugar-free whipped topping. Carbohydrate calculation Identify the foods that contain carbohydrates: Rice. Corn. Milk. Strawberries. Calculate how many servings you have of each food: 2 servings rice. 1 serving corn. 1 serving milk. 1 serving strawberries. Multiply each number of servings by 15 g: 2 servings rice x 15 g = 30 g. 1 serving corn x 15 g = 15 g. 1 serving milk x 15 g = 15 g. 1 serving strawberries x 15 g = 15  g. Add together all of the amounts to find the total grams of carbohydrates eaten: 30 g + 15 g + 15 g + 15 g = 75 g of carbohydrates total. What are tips for following this plan? Shopping Develop a meal plan and then make a shopping list. Buy fresh and frozen vegetables, fresh and frozen fruit, dairy, eggs, beans, lentils, and whole grains. Look at food labels. Choose foods that have more fiber and less sugar. Avoid processed foods and foods with added sugars. Meal planning Aim to have the same amount of carbohydrates at each meal and for each snack time. Plan to have regular, balanced meals and snacks. Where to find more information American Diabetes Association: www.diabetes.org Centers for Disease Control and Prevention: www.cdc.gov Summary Carbohydrate counting is a method of keeping track of how many carbohydrates you eat. Eating carbohydrates naturally increases the amount of sugar (glucose) in the blood. Counting how many carbohydrates you eat improves your blood glucose control, which helps you manage your diabetes. A dietitian can help you make a meal plan and calculate how many carbohydrates you should have at each meal and snack. This information is not intended to replace advice given to you by your health care provider. Make sure you discuss any questions you have with your health care provider. Document Revised: 05/25/2019 Document Reviewed: 05/26/2019 Elsevier Patient Education  2021 Elsevier Inc.  

## 2021-04-09 NOTE — Addendum Note (Signed)
Addended by: Rolena Infante on: 04/09/2021 11:33 AM   Modules accepted: Orders

## 2021-04-10 LAB — CMP14+EGFR
ALT: 41 IU/L (ref 0–44)
AST: 27 IU/L (ref 0–40)
Albumin/Globulin Ratio: 1.6 (ref 1.2–2.2)
Albumin: 4.4 g/dL (ref 3.8–4.9)
Alkaline Phosphatase: 105 IU/L (ref 44–121)
BUN/Creatinine Ratio: 13 (ref 9–20)
BUN: 15 mg/dL (ref 6–24)
Bilirubin Total: 0.4 mg/dL (ref 0.0–1.2)
CO2: 25 mmol/L (ref 20–29)
Calcium: 9.8 mg/dL (ref 8.7–10.2)
Chloride: 100 mmol/L (ref 96–106)
Creatinine, Ser: 1.13 mg/dL (ref 0.76–1.27)
Globulin, Total: 2.7 g/dL (ref 1.5–4.5)
Glucose: 150 mg/dL — ABNORMAL HIGH (ref 70–99)
Potassium: 4.8 mmol/L (ref 3.5–5.2)
Sodium: 139 mmol/L (ref 134–144)
Total Protein: 7.1 g/dL (ref 6.0–8.5)
eGFR: 78 mL/min/{1.73_m2} (ref >59)

## 2021-04-10 LAB — CBC WITH DIFFERENTIAL/PLATELET
Basophils Absolute: 0.1 10*3/uL (ref 0.0–0.2)
Basos: 1 %
EOS (ABSOLUTE): 0.7 10*3/uL — ABNORMAL HIGH (ref 0.0–0.4)
Eos: 6 %
Hematocrit: 41.6 % (ref 37.5–51.0)
Hemoglobin: 13.8 g/dL (ref 13.0–17.7)
Immature Grans (Abs): 0.1 10*3/uL (ref 0.0–0.1)
Immature Granulocytes: 1 %
Lymphocytes Absolute: 3.7 10*3/uL — ABNORMAL HIGH (ref 0.7–3.1)
Lymphs: 34 %
MCH: 27.4 pg (ref 26.6–33.0)
MCHC: 33.2 g/dL (ref 31.5–35.7)
MCV: 83 fL (ref 79–97)
Monocytes Absolute: 1 10*3/uL — ABNORMAL HIGH (ref 0.1–0.9)
Monocytes: 9 %
Neutrophils Absolute: 5.3 10*3/uL (ref 1.4–7.0)
Neutrophils: 49 %
Platelets: 272 10*3/uL (ref 150–450)
RBC: 5.04 x10E6/uL (ref 4.14–5.80)
RDW: 13.4 % (ref 11.6–15.4)
WBC: 10.9 10*3/uL — ABNORMAL HIGH (ref 3.4–10.8)

## 2021-04-10 LAB — MICROALBUMIN / CREATININE URINE RATIO
Creatinine, Urine: 128 mg/dL
Microalb/Creat Ratio: 3 mg/g creat (ref 0–29)
Microalbumin, Urine: 4.2 ug/mL

## 2021-04-10 LAB — PSA, TOTAL AND FREE
PSA, Free Pct: 32.5 %
PSA, Free: 0.13 ng/mL
Prostate Specific Ag, Serum: 0.4 ng/mL (ref 0.0–4.0)

## 2021-04-10 LAB — LIPID PANEL
Chol/HDL Ratio: 3.9 ratio (ref 0.0–5.0)
Cholesterol, Total: 132 mg/dL (ref 100–199)
HDL: 34 mg/dL — ABNORMAL LOW (ref >39)
LDL Chol Calc (NIH): 63 mg/dL (ref 0–99)
Triglycerides: 216 mg/dL — ABNORMAL HIGH (ref 0–149)
VLDL Cholesterol Cal: 35 mg/dL (ref 5–40)

## 2021-07-10 ENCOUNTER — Ambulatory Visit: Payer: BC Managed Care – PPO | Admitting: Nurse Practitioner

## 2021-07-10 ENCOUNTER — Other Ambulatory Visit: Payer: Self-pay

## 2021-07-10 ENCOUNTER — Encounter: Payer: Self-pay | Admitting: Nurse Practitioner

## 2021-07-10 VITALS — BP 116/65 | HR 91 | Temp 97.5°F | Resp 20 | Ht 71.0 in | Wt 266.0 lb

## 2021-07-10 DIAGNOSIS — Z23 Encounter for immunization: Secondary | ICD-10-CM | POA: Diagnosis not present

## 2021-07-10 DIAGNOSIS — E785 Hyperlipidemia, unspecified: Secondary | ICD-10-CM

## 2021-07-10 DIAGNOSIS — E119 Type 2 diabetes mellitus without complications: Secondary | ICD-10-CM

## 2021-07-10 DIAGNOSIS — E039 Hypothyroidism, unspecified: Secondary | ICD-10-CM

## 2021-07-10 DIAGNOSIS — I10 Essential (primary) hypertension: Secondary | ICD-10-CM

## 2021-07-10 DIAGNOSIS — M10072 Idiopathic gout, left ankle and foot: Secondary | ICD-10-CM

## 2021-07-10 LAB — CMP14+EGFR
ALT: 33 IU/L (ref 0–44)
AST: 22 IU/L (ref 0–40)
Albumin/Globulin Ratio: 2 (ref 1.2–2.2)
Albumin: 4.7 g/dL (ref 3.8–4.9)
Alkaline Phosphatase: 106 IU/L (ref 44–121)
BUN/Creatinine Ratio: 15 (ref 9–20)
BUN: 17 mg/dL (ref 6–24)
Bilirubin Total: 0.3 mg/dL (ref 0.0–1.2)
CO2: 26 mmol/L (ref 20–29)
Calcium: 9.9 mg/dL (ref 8.7–10.2)
Chloride: 101 mmol/L (ref 96–106)
Creatinine, Ser: 1.11 mg/dL (ref 0.76–1.27)
Globulin, Total: 2.4 g/dL (ref 1.5–4.5)
Glucose: 139 mg/dL — ABNORMAL HIGH (ref 70–99)
Potassium: 4.9 mmol/L (ref 3.5–5.2)
Sodium: 140 mmol/L (ref 134–144)
Total Protein: 7.1 g/dL (ref 6.0–8.5)
eGFR: 80 mL/min/{1.73_m2} (ref >59)

## 2021-07-10 LAB — CBC WITH DIFFERENTIAL/PLATELET
Basophils Absolute: 0.1 10*3/uL (ref 0.0–0.2)
Basos: 1 %
EOS (ABSOLUTE): 0.5 10*3/uL — ABNORMAL HIGH (ref 0.0–0.4)
Eos: 5 %
Hematocrit: 41.9 % (ref 37.5–51.0)
Hemoglobin: 14 g/dL (ref 13.0–17.7)
Immature Grans (Abs): 0 10*3/uL (ref 0.0–0.1)
Immature Granulocytes: 0 %
Lymphocytes Absolute: 3.7 10*3/uL — ABNORMAL HIGH (ref 0.7–3.1)
Lymphs: 34 %
MCH: 27.3 pg (ref 26.6–33.0)
MCHC: 33.4 g/dL (ref 31.5–35.7)
MCV: 82 fL (ref 79–97)
Monocytes Absolute: 0.9 10*3/uL (ref 0.1–0.9)
Monocytes: 9 %
Neutrophils Absolute: 5.4 10*3/uL (ref 1.4–7.0)
Neutrophils: 51 %
Platelets: 261 10*3/uL (ref 150–450)
RBC: 5.12 x10E6/uL (ref 4.14–5.80)
RDW: 13.1 % (ref 11.6–15.4)
WBC: 10.7 10*3/uL (ref 3.4–10.8)

## 2021-07-10 LAB — LIPID PANEL
Chol/HDL Ratio: 3.6 ratio (ref 0.0–5.0)
Cholesterol, Total: 127 mg/dL (ref 100–199)
HDL: 35 mg/dL — ABNORMAL LOW (ref >39)
LDL Chol Calc (NIH): 61 mg/dL (ref 0–99)
Triglycerides: 188 mg/dL — ABNORMAL HIGH (ref 0–149)
VLDL Cholesterol Cal: 31 mg/dL (ref 5–40)

## 2021-07-10 LAB — BAYER DCA HB A1C WAIVED: HB A1C (BAYER DCA - WAIVED): 7.9 % — ABNORMAL HIGH (ref 4.8–5.6)

## 2021-07-10 MED ORDER — LEVOTHYROXINE SODIUM 150 MCG PO TABS: 150.0000 ug | ORAL_TABLET | Freq: Every day | ORAL | 1 refills | Status: DC

## 2021-07-10 MED ORDER — GLIMEPIRIDE 4 MG PO TABS: 4.0000 mg | ORAL_TABLET | Freq: Every day | ORAL | 1 refills | Status: DC

## 2021-07-10 MED ORDER — ATORVASTATIN CALCIUM 40 MG PO TABS: ORAL_TABLET | ORAL | 1 refills | Status: DC

## 2021-07-10 MED ORDER — METFORMIN HCL 1000 MG PO TABS: ORAL_TABLET | ORAL | 1 refills | Status: DC

## 2021-07-10 MED ORDER — LISINOPRIL-HYDROCHLOROTHIAZIDE 10-12.5 MG PO TABS: 1.0000 | ORAL_TABLET | Freq: Every day | ORAL | 1 refills | Status: DC

## 2021-07-10 NOTE — Progress Notes (Signed)
Subjective:    Patient ID: Tyler Duran, male    DOB: 12-29-1970, 51 y.o.   MRN: [id number]   Chief Complaint: Medical Management of Chronic Issues    HPI:  Tyler Duran is a 51 y.o. who identifies as a male who was assigned male at birth.   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 Hyperlipidemia with target LDL less than 100 Tries to watch diet and stays very active. Lab Results  Component Value Date   CHOL 132 04/09/2021   HDL 34 (L) 04/09/2021   LDLCALC 63 04/09/2021   TRIG 216 (H) 04/09/2021   CHOLHDL 3.9 04/09/2021     2. Primary hypertension No c/o chest pain, sob or headache. Doe snot check blood pressure at home. BP Readings from Last 3 Encounters:  07/10/21 116/65  04/09/21 120/76  12/13/20 98/67     3. Type 2 diabetes mellitus without complication, without long-term current use of insulin (HCC) His fasting blood sugars are running 110-140. He denies any low blood sugars. Lab Results  Component Value Date   HGBA1C 8.2 (H) 04/09/2021     4. Acquired hypothyroidism No problems that aware of. Lab Results  Component Value Date   TSH 1.970 12/13/2020     5. Acute idiopathic gout of left ankle No recent flare ups  6. Morbid obesity ([place]) No recent weight changes Wt Readings from Last 3 Encounters:  07/10/21 266 lb (120.7 kg)  04/09/21 268 lb (121.6 kg)  12/13/20 261 lb (118.4 kg)   BMI Readings from Last 3 Encounters:  07/10/21 37.10 kg/m  04/09/21 37.38 kg/m  12/13/20 36.40 kg/m      New complaints: None today  No Known Allergies Outpatient Encounter Medications as of 07/10/2021  Medication Sig   atorvastatin (LIPITOR) 40 MG tablet TAKE 1 TABLET(40 MG) BY MOUTH DAILY   glimepiride (AMARYL) 4 MG tablet Take 1 tablet (4 mg total) by mouth daily with breakfast.   glucose blood (ONETOUCH VERIO)  test strip Check blood sugar every morning fasting and prn  Dx 250.02   levothyroxine (SYNTHROID) 150 MCG tablet Take 1 tablet (150 mcg total) by mouth daily.   lisinopril-hydrochlorothiazide (ZESTORETIC) 10-12.5 MG tablet Take 1 tablet by mouth daily.   metFORMIN (GLUCOPHAGE) 1000 MG tablet TAKE 1 TABLET(1000 MG) BY MOUTH TWICE DAILY WITH A MEAL   indomethacin (INDOCIN) 50 MG capsule Take 1 capsule (50 mg total) by mouth 3 (three) times daily with meals as needed. (Patient not taking: Reported on 07/10/2021)   No facility-administered encounter medications on file as of 07/10/2021.    Past Surgical History:  Procedure Laterality Date   APPENDECTOMY     CHOLECYSTECTOMY N/A 07/21/2013   Procedure: LAPAROSCOPIC CHOLECYSTECTOMY;  Surgeon: Jamesetta So, MD;  Location: [facility];  Service: General;  Laterality: N/A;    Family History  Problem Relation Age of Onset   Cancer Mother    Diabetes Father    Colon polyps Father    Colon cancer Neg Hx    Esophageal cancer Neg Hx    Rectal cancer Neg Hx    Stomach cancer Neg Hx       Controlled substance contract: n/a     Review of Systems  Constitutional:  Negative for diaphoresis.  Eyes:  Negative for pain.  Respiratory:  Negative for shortness of breath.  Cardiovascular:  Negative for chest pain, palpitations and leg swelling.  Gastrointestinal:  Negative for abdominal pain.  Endocrine: Negative for polydipsia.  Skin:  Negative for rash.  Neurological:  Negative for dizziness, weakness and headaches.  Hematological:  Does not bruise/bleed easily.  All other systems reviewed and are negative.     Objective:   Physical Exam Vitals and nursing note reviewed.  Constitutional:      Appearance: Normal appearance. He is well-developed.  HENT:     Head: Normocephalic.     Nose: Nose normal.     Mouth/Throat:     Mouth: Mucous membranes are moist.     Pharynx: Oropharynx is clear.  Eyes:     Pupils: Pupils are equal, round, and  reactive to light.  Neck:     Thyroid: No thyroid mass or thyromegaly.     Vascular: No carotid bruit or JVD.     Trachea: Phonation normal.  Cardiovascular:     Rate and Rhythm: Normal rate and regular rhythm.  Pulmonary:     Effort: Pulmonary effort is normal. No respiratory distress.     Breath sounds: Normal breath sounds.  Abdominal:     General: Bowel sounds are normal.     Palpations: Abdomen is soft.     Tenderness: There is no abdominal tenderness.  Musculoskeletal:        General: Normal range of motion.     Cervical back: Normal range of motion and neck supple.  Lymphadenopathy:     Cervical: No cervical adenopathy.  Skin:    General: Skin is warm and dry.  Neurological:     Mental Status: He is alert and oriented to person, place, and time.  Psychiatric:        Behavior: Behavior normal.        Thought Content: Thought content normal.        Judgment: Judgment normal.    BP 116/65    Pulse 91    Temp (!) 97.5 F (36.4 C) (Temporal)    Resp 20    Ht $R'5\' 11"'bt$  (1.803 m)    Wt 266 lb (120.7 kg)    SpO2 95%    BMI 37.10 kg/m   Hgba1c 7.9%     Assessment & Plan:  Tyler Duran comes in today with chief complaint of Medical Management of Chronic Issues   Diagnosis and orders addressed:  1. Hyperlipidemia with target LDL less than 100 Low fat diet - Lipid panel - atorvastatin (LIPITOR) 40 MG tablet; TAKE 1 TABLET(40 MG) BY MOUTH DAILY  Dispense: 90 tablet; Refill: 1  2. Primary hypertension Low sodium diet - CMP14+EGFR - CBC with Differential/Platelet  3. Type 2 diabetes mellitus without complication, without long-term current use of insulin (HCC) Stricter carb counting - Bayer DCA Hb A1c Waived - glimepiride (AMARYL) 4 MG tablet; Take 1 tablet (4 mg total) by mouth daily with breakfast.  Dispense: 90 tablet; Refill: 1 - lisinopril-hydrochlorothiazide (ZESTORETIC) 10-12.5 MG tablet; Take 1 tablet by mouth daily.  Dispense: 90 tablet; Refill: 1 - metFORMIN  (GLUCOPHAGE) 1000 MG tablet; TAKE 1 TABLET(1000 MG) BY MOUTH TWICE DAILY WITH A MEAL  Dispense: 180 tablet; Refill: 1  4. Acquired hypothyroidism Labs pending - levothyroxine (SYNTHROID) 150 MCG tablet; Take 1 tablet (150 mcg total) by mouth daily.  Dispense: 90 tablet; Refill: 1  5. Acute idiopathic gout of left ankle Low purine diet  6. Morbid obesity ([place]) Discussed diet and exercise for person with BMI >25 Will  recheck weight in 3-6 months    Labs pending Health Maintenance reviewed Diet and exercise encouraged  Follow up plan: 3 months   Mary-Margaret Hassell Done, FNP

## 2021-07-10 NOTE — Addendum Note (Signed)
Addended by: Rolena Infante on: 07/10/2021 12:50 PM   Modules accepted: Orders

## 2021-07-10 NOTE — Patient Instructions (Signed)

## 2021-09-03 DIAGNOSIS — E119 Type 2 diabetes mellitus without complications: Secondary | ICD-10-CM

## 2021-09-04 MED ORDER — METFORMIN HCL 1000 MG PO TABS: ORAL_TABLET | ORAL | 1 refills | Status: DC

## 2021-09-04 NOTE — Telephone Encounter (Signed)
Stay on metformin until seen at next visit ?

## 2021-09-05 ENCOUNTER — Other Ambulatory Visit: Payer: Self-pay | Admitting: Nurse Practitioner

## 2021-09-05 ENCOUNTER — Telehealth: Payer: Self-pay | Admitting: Nurse Practitioner

## 2021-09-05 DIAGNOSIS — E039 Hypothyroidism, unspecified: Secondary | ICD-10-CM

## 2021-09-05 MED ORDER — OZEMPIC (0.25 OR 0.5 MG/DOSE) 2 MG/1.5ML ~~LOC~~ SOPN: 0.5000 mg | PEN_INJECTOR | SUBCUTANEOUS | 2 refills | Status: DC

## 2021-09-05 NOTE — Telephone Encounter (Signed)
Sent in ozempic prescription- come by office if we need to show you how to use. Injection is weekly ?

## 2021-09-05 NOTE — Telephone Encounter (Signed)
Patient notified and verbalized understanding. 

## 2021-09-10 ENCOUNTER — Telehealth: Payer: Self-pay | Admitting: *Deleted

## 2021-09-10 NOTE — Telephone Encounter (Signed)
PA for Ozempic-In Process ? ? ?Key: [id number] - [id number] ?

## 2021-09-15 ENCOUNTER — Telehealth: Payer: Self-pay

## 2021-09-15 NOTE — Telephone Encounter (Signed)
Lakshya Luty (Key: [id number]) ?Rx #: [id number] ?Ozempic (0.25 or 0.5 MG/DOSE) '2MG'$ /3ML pen-injectors ?  ?Form ?[employer] Electronic Request Form (CB) ?Created ?10 days ago ?Sent to Plan ?5 days ago ?Plan Response ?5 days ago ?Submit Clinical Questions ?5 days ago ?Determination ?Favorable ?5 days ago ?Message from Plan ?Effective from 09/10/2021 through 09/09/2022. ?

## 2021-09-16 NOTE — Telephone Encounter (Signed)
Message from Plan ?Effective from 09/10/2021 through 09/09/2022. ?

## 2021-10-03 ENCOUNTER — Other Ambulatory Visit: Payer: Self-pay

## 2021-10-03 ENCOUNTER — Ambulatory Visit (INDEPENDENT_AMBULATORY_CARE_PROVIDER_SITE_OTHER): Payer: BC Managed Care – PPO | Admitting: Nurse Practitioner

## 2021-10-03 ENCOUNTER — Encounter: Payer: Self-pay | Admitting: Nurse Practitioner

## 2021-10-03 VITALS — BP 119/77 | HR 82 | Temp 98.2°F | Resp 20 | Ht 71.0 in | Wt 262.0 lb

## 2021-10-03 DIAGNOSIS — I10 Essential (primary) hypertension: Secondary | ICD-10-CM

## 2021-10-03 DIAGNOSIS — E039 Hypothyroidism, unspecified: Secondary | ICD-10-CM

## 2021-10-03 DIAGNOSIS — E119 Type 2 diabetes mellitus without complications: Secondary | ICD-10-CM | POA: Diagnosis not present

## 2021-10-03 DIAGNOSIS — E785 Hyperlipidemia, unspecified: Secondary | ICD-10-CM

## 2021-10-03 DIAGNOSIS — M10072 Idiopathic gout, left ankle and foot: Secondary | ICD-10-CM

## 2021-10-03 LAB — CMP14+EGFR
ALT: 29 IU/L (ref 0–44)
AST: 21 IU/L (ref 0–40)
Albumin/Globulin Ratio: 1.7 (ref 1.2–2.2)
Albumin: 4.4 g/dL (ref 3.8–4.9)
Alkaline Phosphatase: 101 IU/L (ref 44–121)
BUN/Creatinine Ratio: 13 (ref 9–20)
BUN: 14 mg/dL (ref 6–24)
Bilirubin Total: 0.4 mg/dL (ref 0.0–1.2)
CO2: 25 mmol/L (ref 20–29)
Calcium: 9.5 mg/dL (ref 8.7–10.2)
Chloride: 104 mmol/L (ref 96–106)
Creatinine, Ser: 1.11 mg/dL (ref 0.76–1.27)
Globulin, Total: 2.6 g/dL (ref 1.5–4.5)
Glucose: 142 mg/dL — ABNORMAL HIGH (ref 70–99)
Potassium: 4.4 mmol/L (ref 3.5–5.2)
Sodium: 142 mmol/L (ref 134–144)
Total Protein: 7 g/dL (ref 6.0–8.5)
eGFR: 80 mL/min/{1.73_m2} (ref >59)

## 2021-10-03 LAB — CBC WITH DIFFERENTIAL/PLATELET
Basophils Absolute: 0.1 10*3/uL (ref 0.0–0.2)
Basos: 1 %
EOS (ABSOLUTE): 0.4 10*3/uL (ref 0.0–0.4)
Eos: 4 %
Hematocrit: 42.2 % (ref 37.5–51.0)
Hemoglobin: 13.8 g/dL (ref 13.0–17.7)
Immature Grans (Abs): 0.1 10*3/uL (ref 0.0–0.1)
Immature Granulocytes: 1 %
Lymphocytes Absolute: 2.9 10*3/uL (ref 0.7–3.1)
Lymphs: 29 %
MCH: 27.2 pg (ref 26.6–33.0)
MCHC: 32.7 g/dL (ref 31.5–35.7)
MCV: 83 fL (ref 79–97)
Monocytes Absolute: 0.8 10*3/uL (ref 0.1–0.9)
Monocytes: 8 %
Neutrophils Absolute: 5.7 10*3/uL (ref 1.4–7.0)
Neutrophils: 57 %
Platelets: 247 10*3/uL (ref 150–450)
RBC: 5.07 x10E6/uL (ref 4.14–5.80)
RDW: 13.6 % (ref 11.6–15.4)
WBC: 10 10*3/uL (ref 3.4–10.8)

## 2021-10-03 LAB — LIPID PANEL
Chol/HDL Ratio: 3.4 ratio (ref 0.0–5.0)
Cholesterol, Total: 115 mg/dL (ref 100–199)
HDL: 34 mg/dL — ABNORMAL LOW (ref >39)
LDL Chol Calc (NIH): 51 mg/dL (ref 0–99)
Triglycerides: 183 mg/dL — ABNORMAL HIGH (ref 0–149)
VLDL Cholesterol Cal: 30 mg/dL (ref 5–40)

## 2021-10-03 LAB — BAYER DCA HB A1C WAIVED: HB A1C (BAYER DCA - WAIVED): 7.4 % — ABNORMAL HIGH (ref 4.8–5.6)

## 2021-10-03 MED ORDER — METFORMIN HCL 500 MG PO TABS: 500.0000 mg | ORAL_TABLET | Freq: Two times a day (BID) | ORAL | 1 refills | Status: DC

## 2021-10-03 MED ORDER — LEVOTHYROXINE SODIUM 150 MCG PO TABS: 150.0000 ug | ORAL_TABLET | Freq: Every day | ORAL | 1 refills | Status: DC

## 2021-10-03 MED ORDER — ATORVASTATIN CALCIUM 40 MG PO TABS: ORAL_TABLET | ORAL | 1 refills | Status: DC

## 2021-10-03 NOTE — Patient Instructions (Signed)
Diabetes Mellitus and Foot Care Foot care is an important part of your health, especially when you have diabetes. Diabetes may cause you to have problems because of poor blood flow (circulation) to your feet and legs, which can cause your skin to: Become thinner and drier. Break more easily. Heal more slowly. Peel and crack. You may also have nerve damage (neuropathy) in your legs and feet, causing decreased feeling in them. This means that you may not notice minor injuries to your feet that could lead to more serious problems. Noticing and addressing any potential problems early is the best way to prevent future foot problems. How to care for your feet Foot hygiene  Wash your feet daily with warm water and mild soap. Do not use hot water. Then, pat your feet and the areas between your toes until they are completely dry. Do not soak your feet as this can dry your skin. Trim your toenails straight across. Do not dig under them or around the cuticle. File the edges of your nails with an emery board or nail file. Apply a moisturizing lotion or petroleum jelly to the skin on your feet and to dry, brittle toenails. Use lotion that does not contain alcohol and is unscented. Do not apply lotion between your toes. Shoes and socks Wear clean socks or stockings every day. Make sure they are not too tight. Do not wear knee-high stockings since they may decrease blood flow to your legs. Wear shoes that fit properly and have enough cushioning. Always look in your shoes before you put them on to be sure there are no objects inside. To break in new shoes, wear them for just a few hours a day. This prevents injuries on your feet. Wounds, scrapes, corns, and calluses  Check your feet daily for blisters, cuts, bruises, sores, and redness. If you cannot see the bottom of your feet, use a mirror or ask someone for help. Do not cut corns or calluses or try to remove them with medicine. If you find a minor scrape,  cut, or break in the skin on your feet, keep it and the skin around it clean and dry. You may clean these areas with mild soap and water. Do not clean the area with peroxide, alcohol, or iodine. If you have a wound, scrape, corn, or callus on your foot, look at it several times a day to make sure it is healing and not infected. Check for: Redness, swelling, or pain. Fluid or blood. Warmth. Pus or a bad smell. General tips Do not cross your legs. This may decrease blood flow to your feet. Do not use heating pads or hot water bottles on your feet. They may burn your skin. If you have lost feeling in your feet or legs, you may not know this is happening until it is too late. Protect your feet from hot and cold by wearing shoes, such as at the beach or on hot pavement. Schedule a complete foot exam at least once a year (annually) or more often if you have foot problems. Report any cuts, sores, or bruises to your health care provider immediately. Where to find more information American Diabetes Association: www.diabetes.org Association of Diabetes Care & Education Specialists: www.diabeteseducator.org Contact a health care provider if: You have a medical condition that increases your risk of infection and you have any cuts, sores, or bruises on your feet. You have an injury that is not healing. You have redness on your legs or feet. You   feel burning or tingling in your legs or feet. You have pain or cramps in your legs and feet. Your legs or feet are numb. Your feet always feel cold. You have pain around any toenails. Get help right away if: You have a wound, scrape, corn, or callus on your foot and: You have pain, swelling, or redness that gets worse. You have fluid or blood coming from the wound, scrape, corn, or callus. Your wound, scrape, corn, or callus feels warm to the touch. You have pus or a bad smell coming from the wound, scrape, corn, or callus. You have a fever. You have a red  line going up your leg. Summary Check your feet every day for blisters, cuts, bruises, sores, and redness. Apply a moisturizing lotion or petroleum jelly to the skin on your feet and to dry, brittle toenails. Wear shoes that fit properly and have enough cushioning. If you have foot problems, report any cuts, sores, or bruises to your health care provider immediately. Schedule a complete foot exam at least once a year (annually) or more often if you have foot problems. This information is not intended to replace advice given to you by your health care provider. Make sure you discuss any questions you have with your health care provider. Document Revised: 12/14/2019 Document Reviewed: 12/14/2019 Elsevier Patient Education  2023 Elsevier Inc.  

## 2021-10-03 NOTE — Progress Notes (Signed)
? ?Subjective:  ? ? Patient ID: Tyler Duran, male    DOB: 1971/04/12, 52 y.o.   MRN: [id number] ? ? ?Chief Complaint: Medical Management of Chronic Issues ?  ? ?HPI: ? ?Tyler Duran is a 50 y.o. who identifies as a male who was assigned male at birth.  ? 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 ? ? ?Comes in today for follow up of the following chronic medical issues: ? ?1. Hyperlipidemia with target LDL less than 100 ?Does try to watch diet. Does no dedicated exercise. ?Lab Results  ?Component Value Date  ? CHOL 127 07/10/2021  ? HDL 35 (L) 07/10/2021  ? Hope 61 07/10/2021  ? TRIG 188 (H) 07/10/2021  ? CHOLHDL 3.6 07/10/2021  ? ? ? ?2. Primary hypertension ?No c/o chest pain, sob or headache. Does not check blood pressure at home. ?BP Readings from Last 3 Encounters:  ?10/03/21 119/77  ?07/10/21 116/65  ?04/09/21 120/76  ? ? ? ?3. Type 2 diabetes mellitus without complication, without long-term current use of insulin ([place]) ?Fasting blood sugars are running around 160. He has had no low blood usgras. ?Lab Results  ?Component Value Date  ? HGBA1C 7.9 (H) 07/10/2021  ? ? ? ?4. Acquired hypothyroidism ?No problems that he is aware of. ?Lab Results  ?Component Value Date  ? TSH 1.970 12/13/2020  ? ? ? ?5. Acute idiopathic gout of left ankle ?No recent flare ups ? ?6. Morbid obesity ([place]) ?Weight is down 4 lbs ?Wt Readings from Last 3 Encounters:  ?10/03/21 262 lb (118.8 kg)  ?07/10/21 266 lb (120.7 kg)  ?04/09/21 268 lb (121.6 kg)  ? ?BMI Readings from Last 3 Encounters:  ?10/03/21 36.54 kg/m?  ?07/10/21 37.10 kg/m?  ?04/09/21 37.38 kg/m?  ? ? ? ? ?New complaints: ?None today ? ?No Known Allergies ?Outpatient Encounter Medications as of 10/03/2021  ?Medication Sig  ? atorvastatin (LIPITOR) 40 MG tablet TAKE 1 TABLET(40 MG) BY MOUTH DAILY  ? glimepiride (AMARYL) 4 MG tablet Take 1 tablet (4 mg total) by mouth daily with breakfast.   ? glucose blood (ONETOUCH VERIO) test strip Check blood sugar every morning fasting and prn  Dx 250.02  ? indomethacin (INDOCIN) 50 MG capsule Take 1 capsule (50 mg total) by mouth 3 (three) times daily with meals as needed.  ? levothyroxine (SYNTHROID) 150 MCG tablet Take 1 tablet (150 mcg total) by mouth daily.  ? lisinopril-hydrochlorothiazide (ZESTORETIC) 10-12.5 MG tablet Take 1 tablet by mouth daily.  ? metFORMIN (GLUCOPHAGE) 500 MG tablet Take by mouth 2 (two) times daily with a meal.  ? [DISCONTINUED] Semaglutide,0.25 or 0.5MG /DOS, (OZEMPIC, 0.25 OR 0.5 MG/DOSE,) 2 MG/1.5ML SOPN Inject 0.5 mg into the skin once a week.  ? ?No facility-administered encounter medications on file as of 10/03/2021.  ? ? ?Past Surgical History:  ?Procedure Laterality Date  ? APPENDECTOMY    ? CHOLECYSTECTOMY N/A 07/21/2013  ? Procedure: LAPAROSCOPIC CHOLECYSTECTOMY;  Surgeon: Jamesetta So, MD;  Location: [facility];  Service: General;  Laterality: N/A;  ? ? ?Family History  ?Problem Relation Age of Onset  ? Cancer Mother   ? Diabetes Father   ? Colon polyps Father   ? Colon cancer Neg Hx   ? Esophageal cancer Neg Hx   ? Rectal cancer Neg Hx   ? Stomach cancer Neg Hx   ? ? ? ? ?Controlled substance contract: n/a ? ? ? ? ?  Review of Systems  ?Constitutional:  Negative for diaphoresis.  ?Eyes:  Negative for pain.  ?Respiratory:  Negative for shortness of breath.   ?Cardiovascular:  Negative for chest pain, palpitations and leg swelling.  ?Gastrointestinal:  Negative for abdominal pain.  ?Endocrine: Negative for polydipsia.  ?Skin:  Negative for rash.  ?Neurological:  Negative for dizziness, weakness and headaches.  ?Hematological:  Does not bruise/bleed easily.  ?All other systems reviewed and are negative. ? ?   ?Objective:  ? Physical Exam ?Vitals and nursing note reviewed.  ?Constitutional:   ?   Appearance: Normal appearance. He is well-developed.  ?HENT:  ?   Head: Normocephalic.  ?   Nose: Nose normal.  ?   Mouth/Throat:  ?    Mouth: Mucous membranes are moist.  ?   Pharynx: Oropharynx is clear.  ?Eyes:  ?   Pupils: Pupils are equal, round, and reactive to light.  ?Neck:  ?   Thyroid: No thyroid mass or thyromegaly.  ?   Vascular: No carotid bruit or JVD.  ?   Trachea: Phonation normal.  ?Cardiovascular:  ?   Rate and Rhythm: Normal rate and regular rhythm.  ?Pulmonary:  ?   Effort: Pulmonary effort is normal. No respiratory distress.  ?   Breath sounds: Normal breath sounds.  ?Abdominal:  ?   General: Bowel sounds are normal.  ?   Palpations: Abdomen is soft.  ?   Tenderness: There is no abdominal tenderness.  ?Musculoskeletal:     ?   General: Normal range of motion.  ?   Cervical back: Normal range of motion and neck supple.  ?Lymphadenopathy:  ?   Cervical: No cervical adenopathy.  ?Skin: ?   General: Skin is warm and dry.  ?Neurological:  ?   Mental Status: He is alert and oriented to person, place, and time.  ?Psychiatric:     ?   Behavior: Behavior normal.     ?   Thought Content: Thought content normal.     ?   Judgment: Judgment normal.  ? ? ?BP 119/77   Pulse 82   Temp 98.2 ?F (36.8 ?C) (Temporal)   Resp 20   Ht $R'5\' 11"'wL$  (1.803 m)   Wt 262 lb (118.8 kg)   SpO2 94%   BMI 36.54 kg/m?  ? ?HGBA1c 7.4% ? ?   ?Assessment & Plan:  ? ?Tyler Duran comes in today with chief complaint of Medical Management of Chronic Issues ? ? ?Diagnosis and orders addressed: ? ?1. Hyperlipidemia with target LDL less than 100 ?Low fat diet ?- Lipid panel ?- atorvastatin (LIPITOR) 40 MG tablet; TAKE 1 TABLET(40 MG) BY MOUTH DAILY  Dispense: 90 tablet; Refill: 1 ? ?2. Primary hypertension ?Low sodium diet ?- CMP14+EGFR ?- CBC with Differential/Platelet ? ?3. Type 2 diabetes mellitus without complication, without long-term current use of insulin ([place]) ?Continue to watch carbs in diet ?- Bayer DCA Hb A1c Waived ? ?4. Acquired hypothyroidism ?Labs pending ?- levothyroxine (SYNTHROID) 150 MCG tablet; Take 1 tablet (150 mcg total) by mouth daily.   Dispense: 90 tablet; Refill: 1 ? ?5. Acute idiopathic gout of left ankle ?Watch diet to prevent flare up ? ?6. Morbid obesity ([place]) ?Discussed diet and exercise for person with BMI >25 ?Will recheck weight in 3-6 months ? ? ? ?Labs pending ?Health Maintenance reviewed ?Diet and exercise encouraged ? ?Follow up plan: ?3 months ? ? ?Mary-Margaret Hassell Done, FNP ? ?

## 2021-10-07 ENCOUNTER — Ambulatory Visit: Payer: BC Managed Care – PPO | Admitting: Nurse Practitioner

## 2021-12-21 ENCOUNTER — Other Ambulatory Visit: Payer: Self-pay | Admitting: Nurse Practitioner

## 2021-12-21 DIAGNOSIS — E119 Type 2 diabetes mellitus without complications: Secondary | ICD-10-CM

## 2022-01-05 ENCOUNTER — Encounter: Payer: Self-pay | Admitting: Nurse Practitioner

## 2022-01-05 ENCOUNTER — Other Ambulatory Visit: Payer: Self-pay

## 2022-01-05 ENCOUNTER — Ambulatory Visit (INDEPENDENT_AMBULATORY_CARE_PROVIDER_SITE_OTHER): Payer: BC Managed Care – PPO | Admitting: Nurse Practitioner

## 2022-01-05 VITALS — BP 112/72 | HR 82 | Temp 98.1°F | Resp 20 | Ht 71.0 in | Wt 263.0 lb

## 2022-01-05 DIAGNOSIS — E119 Type 2 diabetes mellitus without complications: Secondary | ICD-10-CM

## 2022-01-05 DIAGNOSIS — E039 Hypothyroidism, unspecified: Secondary | ICD-10-CM | POA: Diagnosis not present

## 2022-01-05 DIAGNOSIS — E785 Hyperlipidemia, unspecified: Secondary | ICD-10-CM

## 2022-01-05 DIAGNOSIS — M1000 Idiopathic gout, unspecified site: Secondary | ICD-10-CM

## 2022-01-05 DIAGNOSIS — I10 Essential (primary) hypertension: Secondary | ICD-10-CM | POA: Diagnosis not present

## 2022-01-05 LAB — CMP14+EGFR
ALT: 32 IU/L (ref 0–44)
AST: 21 IU/L (ref 0–40)
Albumin/Globulin Ratio: 1.7 (ref 1.2–2.2)
Albumin: 4.3 g/dL (ref 3.8–4.9)
Alkaline Phosphatase: 102 IU/L (ref 44–121)
BUN/Creatinine Ratio: 14 (ref 9–20)
BUN: 15 mg/dL (ref 6–24)
Bilirubin Total: 0.3 mg/dL (ref 0.0–1.2)
CO2: 23 mmol/L (ref 20–29)
Calcium: 9.7 mg/dL (ref 8.7–10.2)
Chloride: 100 mmol/L (ref 96–106)
Creatinine, Ser: 1.09 mg/dL (ref 0.76–1.27)
Globulin, Total: 2.6 g/dL (ref 1.5–4.5)
Glucose: 150 mg/dL — ABNORMAL HIGH (ref 70–99)
Potassium: 4.6 mmol/L (ref 3.5–5.2)
Sodium: 140 mmol/L (ref 134–144)
Total Protein: 6.9 g/dL (ref 6.0–8.5)
eGFR: 82 mL/min/{1.73_m2} (ref >59)

## 2022-01-05 LAB — LIPID PANEL
Chol/HDL Ratio: 3.9 ratio (ref 0.0–5.0)
Cholesterol, Total: 120 mg/dL (ref 100–199)
HDL: 31 mg/dL — ABNORMAL LOW (ref >39)
LDL Chol Calc (NIH): 58 mg/dL (ref 0–99)
Triglycerides: 183 mg/dL — ABNORMAL HIGH (ref 0–149)
VLDL Cholesterol Cal: 31 mg/dL (ref 5–40)

## 2022-01-05 LAB — CBC WITH DIFFERENTIAL/PLATELET
Basophils Absolute: 0.1 10*3/uL (ref 0.0–0.2)
Basos: 1 %
EOS (ABSOLUTE): 0.4 10*3/uL (ref 0.0–0.4)
Eos: 4 %
Hematocrit: 40.5 % (ref 37.5–51.0)
Hemoglobin: 13.5 g/dL (ref 13.0–17.7)
Immature Grans (Abs): 0 10*3/uL (ref 0.0–0.1)
Immature Granulocytes: 0 %
Lymphocytes Absolute: 2.9 10*3/uL (ref 0.7–3.1)
Lymphs: 31 %
MCH: 27.2 pg (ref 26.6–33.0)
MCHC: 33.3 g/dL (ref 31.5–35.7)
MCV: 82 fL (ref 79–97)
Monocytes Absolute: 0.9 10*3/uL (ref 0.1–0.9)
Monocytes: 9 %
Neutrophils Absolute: 5.2 10*3/uL (ref 1.4–7.0)
Neutrophils: 55 %
Platelets: 236 10*3/uL (ref 150–450)
RBC: 4.96 x10E6/uL (ref 4.14–5.80)
RDW: 13.3 % (ref 11.6–15.4)
WBC: 9.5 10*3/uL (ref 3.4–10.8)

## 2022-01-05 LAB — BAYER DCA HB A1C WAIVED: HB A1C (BAYER DCA - WAIVED): 7.1 % — ABNORMAL HIGH (ref 4.8–5.6)

## 2022-01-05 MED ORDER — LISINOPRIL-HYDROCHLOROTHIAZIDE 10-12.5 MG PO TABS: 1.0000 | ORAL_TABLET | Freq: Every day | ORAL | 1 refills | Status: DC

## 2022-01-05 MED ORDER — GLIMEPIRIDE 4 MG PO TABS: ORAL_TABLET | ORAL | 1 refills | Status: DC

## 2022-01-05 MED ORDER — LEVOTHYROXINE SODIUM 150 MCG PO TABS: 150.0000 ug | ORAL_TABLET | Freq: Every day | ORAL | 1 refills | Status: DC

## 2022-01-05 MED ORDER — METFORMIN HCL 500 MG PO TABS: 500.0000 mg | ORAL_TABLET | Freq: Two times a day (BID) | ORAL | 1 refills | Status: DC

## 2022-01-05 NOTE — Patient Instructions (Signed)
Diabetes Mellitus and Foot Care Foot care is an important part of your health, especially when you have diabetes. Diabetes may cause you to have problems because of poor blood flow (circulation) to your feet and legs, which can cause your skin to: Become thinner and drier. Break more easily. Heal more slowly. Peel and crack. You may also have nerve damage (neuropathy) in your legs and feet, causing decreased feeling in them. This means that you may not notice minor injuries to your feet that could lead to more serious problems. Noticing and addressing any potential problems early is the best way to prevent future foot problems. How to care for your feet Foot hygiene  Wash your feet daily with warm water and mild soap. Do not use hot water. Then, pat your feet and the areas between your toes until they are completely dry. Do not soak your feet as this can dry your skin. Trim your toenails straight across. Do not dig under them or around the cuticle. File the edges of your nails with an emery board or nail file. Apply a moisturizing lotion or petroleum jelly to the skin on your feet and to dry, brittle toenails. Use lotion that does not contain alcohol and is unscented. Do not apply lotion between your toes. Shoes and socks Wear clean socks or stockings every day. Make sure they are not too tight. Do not wear knee-high stockings since they may decrease blood flow to your legs. Wear shoes that fit properly and have enough cushioning. Always look in your shoes before you put them on to be sure there are no objects inside. To break in new shoes, wear them for just a few hours a day. This prevents injuries on your feet. Wounds, scrapes, corns, and calluses  Check your feet daily for blisters, cuts, bruises, sores, and redness. If you cannot see the bottom of your feet, use a mirror or ask someone for help. Do not cut corns or calluses or try to remove them with medicine. If you find a minor scrape,  cut, or break in the skin on your feet, keep it and the skin around it clean and dry. You may clean these areas with mild soap and water. Do not clean the area with peroxide, alcohol, or iodine. If you have a wound, scrape, corn, or callus on your foot, look at it several times a day to make sure it is healing and not infected. Check for: Redness, swelling, or pain. Fluid or blood. Warmth. Pus or a bad smell. General tips Do not cross your legs. This may decrease blood flow to your feet. Do not use heating pads or hot water bottles on your feet. They may burn your skin. If you have lost feeling in your feet or legs, you may not know this is happening until it is too late. Protect your feet from hot and cold by wearing shoes, such as at the beach or on hot pavement. Schedule a complete foot exam at least once a year (annually) or more often if you have foot problems. Report any cuts, sores, or bruises to your health care provider immediately. Where to find more information American Diabetes Association: www.diabetes.org Association of Diabetes Care & Education Specialists: www.diabeteseducator.org Contact a health care provider if: You have a medical condition that increases your risk of infection and you have any cuts, sores, or bruises on your feet. You have an injury that is not healing. You have redness on your legs or feet. You   feel burning or tingling in your legs or feet. You have pain or cramps in your legs and feet. Your legs or feet are numb. Your feet always feel cold. You have pain around any toenails. Get help right away if: You have a wound, scrape, corn, or callus on your foot and: You have pain, swelling, or redness that gets worse. You have fluid or blood coming from the wound, scrape, corn, or callus. Your wound, scrape, corn, or callus feels warm to the touch. You have pus or a bad smell coming from the wound, scrape, corn, or callus. You have a fever. You have a red  line going up your leg. Summary Check your feet every day for blisters, cuts, bruises, sores, and redness. Apply a moisturizing lotion or petroleum jelly to the skin on your feet and to dry, brittle toenails. Wear shoes that fit properly and have enough cushioning. If you have foot problems, report any cuts, sores, or bruises to your health care provider immediately. Schedule a complete foot exam at least once a year (annually) or more often if you have foot problems. This information is not intended to replace advice given to you by your health care provider. Make sure you discuss any questions you have with your health care provider. Document Revised: 12/14/2019 Document Reviewed: 12/14/2019 Elsevier Patient Education  2023 Elsevier Inc.  

## 2022-01-05 NOTE — Progress Notes (Signed)
Subjective:    Patient ID: Tyler Duran, male    DOB: 25-Oct-1970, 51 y.o.   MRN: [id number]  Chief Complaint: medical management of chronic issues     HPI:  Tyler Duran is a 51 y.o. who identifies as a male who was assigned male at birth.   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 Primary hypertension No c/o chest pain, sob or headache. Doe snot check blood pressure at home. BP Readings from Last 3 Encounters:  10/03/21 119/77  07/10/21 116/65  04/09/21 120/76     2. Hyperlipidemia with target LDL less than 100 Does try to watch diet but does no dedicated exercise. Lab Results  Component Value Date   CHOL 115 10/03/2021   HDL 34 (L) 10/03/2021   LDLCALC 51 10/03/2021   TRIG 183 (H) 10/03/2021   CHOLHDL 3.4 10/03/2021   The ASCVD Risk score (Arnett DK, et al., 2019) failed to calculate for the following reasons:   The valid total cholesterol range is 130 to 320 mg/dL   3. Type 2 diabetes mellitus without complication, without long-term current use of insulin ([facility]) Blood sugars are running around 180 on average. No low blood sugars. Lab Results  Component Value Date   HGBA1C 7.4 (H) 10/03/2021     4. Acquired hypothyroidism No problems that he is aware of. Lab Results  Component Value Date   TSH 1.970 12/13/2020     5. Idiopathic gout, unspecified chronicity, unspecified site Has had no recent flare ups.  6. Morbid obesity ([place]) No recent weight changes Wt Readings from Last 3 Encounters:  01/05/22 263 lb (119.3 kg)  10/03/21 262 lb (118.8 kg)  07/10/21 266 lb (120.7 kg)   BMI Readings from Last 3 Encounters:  01/05/22 36.68 kg/m  10/03/21 36.54 kg/m  07/10/21 37.10 kg/m      New complaints: None today  No Known Allergies Outpatient Encounter Medications as of 01/05/2022  Medication Sig    atorvastatin (LIPITOR) 40 MG tablet TAKE 1 TABLET(40 MG) BY MOUTH DAILY   glimepiride (AMARYL) 4 MG tablet TAKE 1 TABLET(4 MG) BY MOUTH DAILY WITH BREAKFAST   glucose blood (ONETOUCH VERIO) test strip Check blood sugar every morning fasting and prn  Dx 250.02   indomethacin (INDOCIN) 50 MG capsule Take 1 capsule (50 mg total) by mouth 3 (three) times daily with meals as needed.   levothyroxine (SYNTHROID) 150 MCG tablet Take 1 tablet (150 mcg total) by mouth daily.   lisinopril-hydrochlorothiazide (ZESTORETIC) 10-12.5 MG tablet Take 1 tablet by mouth daily.   metFORMIN (GLUCOPHAGE) 500 MG tablet Take 1 tablet (500 mg total) by mouth 2 (two) times daily with a meal.   No facility-administered encounter medications on file as of 01/05/2022.    Past Surgical History:  Procedure Laterality Date   APPENDECTOMY     CHOLECYSTECTOMY N/A 07/21/2013   Procedure: LAPAROSCOPIC CHOLECYSTECTOMY;  Surgeon: Jamesetta So, MD;  Location: [facility];  Service: General;  Laterality: N/A;    Family History  Problem Relation Age of Onset   Cancer Mother    Diabetes Father    Colon polyps Father    Colon cancer Neg Hx    Esophageal cancer Neg Hx    Rectal cancer Neg Hx    Stomach cancer Neg Hx       Controlled substance contract: n/a  Review of Systems  Constitutional:  Negative for diaphoresis.  Eyes:  Negative for pain.  Respiratory:  Negative for shortness of breath.   Cardiovascular:  Negative for chest pain, palpitations and leg swelling.  Gastrointestinal:  Negative for abdominal pain.  Endocrine: Negative for polydipsia.  Skin:  Negative for rash.  Neurological:  Negative for dizziness, weakness and headaches.  Hematological:  Does not bruise/bleed easily.  All other systems reviewed and are negative.      Objective:   Physical Exam Vitals and nursing note reviewed.  Constitutional:      Appearance: Normal appearance. He is well-developed.  HENT:     Head: Normocephalic.      Nose: Nose normal.     Mouth/Throat:     Mouth: Mucous membranes are moist.     Pharynx: Oropharynx is clear.  Eyes:     Pupils: Pupils are equal, round, and reactive to light.  Neck:     Thyroid: No thyroid mass or thyromegaly.     Vascular: No carotid bruit or JVD.     Trachea: Phonation normal.  Cardiovascular:     Rate and Rhythm: Normal rate and regular rhythm.  Pulmonary:     Effort: Pulmonary effort is normal. No respiratory distress.     Breath sounds: Normal breath sounds.  Abdominal:     General: Bowel sounds are normal.     Palpations: Abdomen is soft.     Tenderness: There is no abdominal tenderness.  Musculoskeletal:        General: Normal range of motion.     Cervical back: Normal range of motion and neck supple.  Lymphadenopathy:     Cervical: No cervical adenopathy.  Skin:    General: Skin is warm and dry.  Neurological:     Mental Status: He is alert and oriented to person, place, and time.  Psychiatric:        Behavior: Behavior normal.        Thought Content: Thought content normal.        Judgment: Judgment normal.     BP 112/72   Pulse 82   Temp 98.1 F (36.7 C) (Temporal)   Resp 20   Ht _0  (1.803 m)   Wt 263 lb (119.3 kg)   SpO2 97%   BMI 36.68 kg/m   HGBA1c 7.1%      Assessment & Plan:   Tyler Duran comes in today with chief complaint of Medical Management of Chronic Issues   Diagnosis and orders addressed:  1. Primary hypertension Low sodium diet - CMP14+EGFR - CBC with Differential/Platelet  2. Hyperlipidemia with target LDL less than 100 Low fat diet - Lipid panel  3. Type 2 diabetes mellitus without complication, without long-term current use of insulin ([facility]) Continue to watch carbs in diet - Bayer DCA Hb A1c Waived - lisinopril-hydrochlorothiazide (ZESTORETIC) 10-12.5 MG tablet; Take 1 tablet by mouth daily.  Dispense: 90 tablet; Refill: 1 - glimepiride (AMARYL) 4 MG tablet; TAKE 1 TABLET(4 MG) BY MOUTH DAILY  WITH BREAKFAST  Dispense: 90 tablet; Refill: 1 - metFORMIN (GLUCOPHAGE) 500 MG tablet; Take 1 tablet (500 mg total) by mouth 2 (two) times daily with a meal.  Dispense: 90 tablet; Refill: 1  4. Acquired hypothyroidism Labs pending - levothyroxine (SYNTHROID) 150 MCG tablet; Take 1 tablet (150 mcg total) by mouth daily.  Dispense: 90 tablet; Refill: 1  5. Idiopathic gout, unspecified chronicity, unspecified site  6. Morbid obesity ([place]) Discussed diet and exercise for person  with BMI >25 Will recheck weight in 3-6 months    Labs pending Health Maintenance reviewed Diet and exercise encouraged  Follow up plan: 3 months   Mary-Margaret Hassell Done, FNP

## 2022-04-06 ENCOUNTER — Other Ambulatory Visit: Payer: Self-pay

## 2022-04-06 ENCOUNTER — Ambulatory Visit (INDEPENDENT_AMBULATORY_CARE_PROVIDER_SITE_OTHER): Payer: BC Managed Care – PPO | Admitting: Nurse Practitioner

## 2022-04-06 ENCOUNTER — Encounter: Payer: Self-pay | Admitting: Nurse Practitioner

## 2022-04-06 VITALS — BP 106/70 | HR 88 | Temp 98.4°F | Resp 20 | Ht 71.0 in | Wt 267.0 lb

## 2022-04-06 DIAGNOSIS — Z125 Encounter for screening for malignant neoplasm of prostate: Secondary | ICD-10-CM

## 2022-04-06 DIAGNOSIS — E039 Hypothyroidism, unspecified: Secondary | ICD-10-CM

## 2022-04-06 DIAGNOSIS — E785 Hyperlipidemia, unspecified: Secondary | ICD-10-CM | POA: Diagnosis not present

## 2022-04-06 DIAGNOSIS — E119 Type 2 diabetes mellitus without complications: Secondary | ICD-10-CM

## 2022-04-06 DIAGNOSIS — I10 Essential (primary) hypertension: Secondary | ICD-10-CM | POA: Diagnosis not present

## 2022-04-06 DIAGNOSIS — M1000 Idiopathic gout, unspecified site: Secondary | ICD-10-CM

## 2022-04-06 DIAGNOSIS — Z1211 Encounter for screening for malignant neoplasm of colon: Secondary | ICD-10-CM

## 2022-04-06 LAB — BAYER DCA HB A1C WAIVED: HB A1C (BAYER DCA - WAIVED): 8.1 % — ABNORMAL HIGH (ref 4.8–5.6)

## 2022-04-06 MED ORDER — METFORMIN HCL 500 MG PO TABS: 500.0000 mg | ORAL_TABLET | Freq: Two times a day (BID) | ORAL | 1 refills | Status: DC

## 2022-04-06 MED ORDER — LISINOPRIL-HYDROCHLOROTHIAZIDE 10-12.5 MG PO TABS: 1.0000 | ORAL_TABLET | Freq: Every day | ORAL | 1 refills | Status: DC

## 2022-04-06 MED ORDER — LEVOTHYROXINE SODIUM 150 MCG PO TABS: 150.0000 ug | ORAL_TABLET | Freq: Every day | ORAL | 1 refills | Status: DC

## 2022-04-06 MED ORDER — GLIMEPIRIDE 4 MG PO TABS: ORAL_TABLET | ORAL | 1 refills | Status: DC

## 2022-04-06 MED ORDER — ATORVASTATIN CALCIUM 40 MG PO TABS: ORAL_TABLET | ORAL | 1 refills | Status: DC

## 2022-04-06 NOTE — Patient Instructions (Signed)

## 2022-04-06 NOTE — Progress Notes (Signed)
Subjective:    Patient ID: Tyler Duran, male    DOB: [birth year], 51 y.o.   MRN: [id number]   Chief Complaint: medical management of chronic issues     HPI:  Tyler Duran is a 51 y.o. who identifies as a male who was assigned male at birth.   Social history: Lives with: wife and kids Work history: owns a  [employer]   Comes in today for follow up of the following chronic medical issues:  1. Type 2 diabetes mellitus without complication, without long-term current use of insulin (HCC) Fasting blood sugars are running around 120-160.  He has not been chaking his blood sugars very often lately.He denies any low blood sugars. Lab Results  Component Value Date   HGBA1C 7.1 (H) 01/05/2022     2. Primary hypertension No c/o chest pain, sob or headache. Does not check blood pressure at home. BP Readings from Last 3 Encounters:  01/05/22 112/72  10/03/21 119/77  07/10/21 116/65     3. Hyperlipidemia with target LDL less than 100 Does try to watch diet but does no exercise. Lab Results  Component Value Date   CHOL 120 01/05/2022   HDL 31 (L) 01/05/2022   LDLCALC 58 01/05/2022   TRIG 183 (H) 01/05/2022   CHOLHDL 3.9 01/05/2022     4. Screening for prostate cancer No voiding issues. Lab Results  Component Value Date   PSA1 0.4 04/09/2021   PSA1 0.5 01/31/2019   PSA1 0.4 08/10/2017   PSA 0.4 07/18/2014      5. Acquired hypothyroidism No issues that he is aware of. Lab Results  Component Value Date   TSH 1.970 12/13/2020     6. Idiopathic gout, unspecified chronicity, unspecified site No recent flare ups  7. Morbid obesity ([place]) No recent weight changes Wt Readings from Last 3 Encounters:  04/06/22 267 lb (121.1 kg)  01/05/22 263 lb (119.3 kg)  10/03/21 262 lb (118.8 kg)   BMI Readings from Last 3 Encounters:  04/06/22 37.24 kg/m  01/05/22 36.68 kg/m  10/03/21 36.54 kg/m     New complaints: None today  No Known  Allergies Outpatient Encounter Medications as of 04/06/2022  Medication Sig   atorvastatin (LIPITOR) 40 MG tablet TAKE 1 TABLET(40 MG) BY MOUTH DAILY   glimepiride (AMARYL) 4 MG tablet TAKE 1 TABLET(4 MG) BY MOUTH DAILY WITH BREAKFAST   glucose blood (ONETOUCH VERIO) test strip Check blood sugar every morning fasting and prn  Dx 250.02   indomethacin (INDOCIN) 50 MG capsule Take 1 capsule (50 mg total) by mouth 3 (three) times daily with meals as needed.   levothyroxine (SYNTHROID) 150 MCG tablet Take 1 tablet (150 mcg total) by mouth daily.   lisinopril-hydrochlorothiazide (ZESTORETIC) 10-12.5 MG tablet Take 1 tablet by mouth daily.   metFORMIN (GLUCOPHAGE) 500 MG tablet Take 1 tablet (500 mg total) by mouth 2 (two) times daily with a meal.   No facility-administered encounter medications on file as of 04/06/2022.    Past Surgical History:  Procedure Laterality Date   APPENDECTOMY     CHOLECYSTECTOMY N/A 07/21/2013   Procedure: LAPAROSCOPIC CHOLECYSTECTOMY;  Surgeon: Jamesetta So, MD;  Location: [facility];  Service: General;  Laterality: N/A;    Family History  Problem Relation Age of Onset   Cancer Mother    Diabetes Father    Colon polyps Father    Colon cancer Neg Hx    Esophageal cancer Neg Hx    Rectal  cancer Neg Hx    Stomach cancer Neg Hx       Controlled substance contract: n/a     Review of Systems  Constitutional:  Negative for diaphoresis.  Eyes:  Negative for pain.  Respiratory:  Negative for shortness of breath.   Cardiovascular:  Negative for chest pain, palpitations and leg swelling.  Gastrointestinal:  Negative for abdominal pain.  Endocrine: Negative for polydipsia.  Skin:  Negative for rash.  Neurological:  Negative for dizziness, weakness and headaches.  Hematological:  Does not bruise/bleed easily.  All other systems reviewed and are negative.      Objective:   Physical Exam Vitals and nursing note reviewed.  Constitutional:      Appearance:  Normal appearance. He is well-developed.  HENT:     Head: Normocephalic.     Nose: Nose normal.     Mouth/Throat:     Mouth: Mucous membranes are moist.     Pharynx: Oropharynx is clear.  Eyes:     Pupils: Pupils are equal, round, and reactive to light.  Neck:     Thyroid: No thyroid mass or thyromegaly.     Vascular: No carotid bruit or JVD.     Trachea: Phonation normal.  Cardiovascular:     Rate and Rhythm: Normal rate and regular rhythm.  Pulmonary:     Effort: Pulmonary effort is normal. No respiratory distress.     Breath sounds: Normal breath sounds.  Abdominal:     General: Bowel sounds are normal.     Palpations: Abdomen is soft.     Tenderness: There is no abdominal tenderness.  Musculoskeletal:        General: Normal range of motion.     Cervical back: Normal range of motion and neck supple.  Lymphadenopathy:     Cervical: No cervical adenopathy.  Skin:    General: Skin is warm and dry.  Neurological:     Mental Status: He is alert and oriented to person, place, and time.  Psychiatric:        Behavior: Behavior normal.        Thought Content: Thought content normal.        Judgment: Judgment normal.    BP 106/70   Pulse 88   Temp 98.4 F (36.9 C) (Temporal)   Resp 20   Ht _0  (1.803 m)   Wt 267 lb (121.1 kg)   SpO2 93%   BMI 37.24 kg/m    Hgba1c 8.1%    Assessment & Plan:  Tyler Duran comes in today with chief complaint of Medical Management of Chronic Issues   Diagnosis and orders addressed:  1. Type 2 diabetes mellitus without complication, without long-term current use of insulin (HCC) Stricter carb counting No med change stoday - Bayer DCA Hb A1c Waived - Microalbumin / creatinine urine ratio - metFORMIN (GLUCOPHAGE) 500 MG tablet; Take 1 tablet (500 mg total) by mouth 2 (two) times daily with a meal.  Dispense: 90 tablet; Refill: 1 - lisinopril-hydrochlorothiazide (ZESTORETIC) 10-12.5 MG tablet; Take 1 tablet by mouth daily.   Dispense: 90 tablet; Refill: 1 - glimepiride (AMARYL) 4 MG tablet; TAKE 1 TABLET(4 MG) BY MOUTH DAILY WITH BREAKFAST  Dispense: 90 tablet; Refill: 1  2. Primary hypertension Low sodium diet - CBC with Differential/Platelet - CMP14+EGFR  3. Hyperlipidemia with target LDL less than 100 Low fat diet - Lipid panel - atorvastatin (LIPITOR) 40 MG tablet; TAKE 1 TABLET(40 MG) BY MOUTH DAILY  Dispense: 90 tablet; Refill:  1  4. Screening for prostate cancer - PSA, total and free  5. Acquired hypothyroidism Labs pending - levothyroxine (SYNTHROID) 150 MCG tablet; Take 1 tablet (150 mcg total) by mouth daily.  Dispense: 90 tablet; Refill: 1  6. Idiopathic gout, unspecified chronicity, unspecified site Low purine diet  7. Morbid obesity ([place]) Discussed diet and exercise for person with BMI >25 Will recheck weight in 3-6 months   8. Encounter for screening colonoscopy - Ambulatory referral to Gastroenterology   Labs pending Health Maintenance reviewed Diet and exercise encouraged  Follow up plan: 3 months   Mary-Margaret Hassell Done, FNP

## 2022-04-07 LAB — LIPID PANEL
Chol/HDL Ratio: 3.9 ratio (ref 0.0–5.0)
Cholesterol, Total: 132 mg/dL (ref 100–199)
HDL: 34 mg/dL — ABNORMAL LOW (ref >39)
LDL Chol Calc (NIH): 63 mg/dL (ref 0–99)
Triglycerides: 213 mg/dL — ABNORMAL HIGH (ref 0–149)
VLDL Cholesterol Cal: 35 mg/dL (ref 5–40)

## 2022-04-07 LAB — CMP14+EGFR
ALT: 29 IU/L (ref 0–44)
AST: 17 IU/L (ref 0–40)
Albumin/Globulin Ratio: 1.5 (ref 1.2–2.2)
Albumin: 4 g/dL (ref 3.8–4.9)
Alkaline Phosphatase: 102 IU/L (ref 44–121)
BUN/Creatinine Ratio: 13 (ref 9–20)
BUN: 14 mg/dL (ref 6–24)
Bilirubin Total: 0.2 mg/dL (ref 0.0–1.2)
CO2: 24 mmol/L (ref 20–29)
Calcium: 9.5 mg/dL (ref 8.7–10.2)
Chloride: 101 mmol/L (ref 96–106)
Creatinine, Ser: 1.11 mg/dL (ref 0.76–1.27)
Globulin, Total: 2.6 g/dL (ref 1.5–4.5)
Glucose: 162 mg/dL — ABNORMAL HIGH (ref 70–99)
Potassium: 4.9 mmol/L (ref 3.5–5.2)
Sodium: 139 mmol/L (ref 134–144)
Total Protein: 6.6 g/dL (ref 6.0–8.5)
eGFR: 79 mL/min/{1.73_m2} (ref >59)

## 2022-04-07 LAB — CBC WITH DIFFERENTIAL/PLATELET
Basophils Absolute: 0.1 10*3/uL (ref 0.0–0.2)
Basos: 1 %
EOS (ABSOLUTE): 0.6 10*3/uL — ABNORMAL HIGH (ref 0.0–0.4)
Eos: 6 %
Hematocrit: 41 % (ref 37.5–51.0)
Hemoglobin: 13.4 g/dL (ref 13.0–17.7)
Immature Grans (Abs): 0 10*3/uL (ref 0.0–0.1)
Immature Granulocytes: 0 %
Lymphocytes Absolute: 3.1 10*3/uL (ref 0.7–3.1)
Lymphs: 31 %
MCH: 27.3 pg (ref 26.6–33.0)
MCHC: 32.7 g/dL (ref 31.5–35.7)
MCV: 84 fL (ref 79–97)
Monocytes Absolute: 0.8 10*3/uL (ref 0.1–0.9)
Monocytes: 8 %
Neutrophils Absolute: 5.7 10*3/uL (ref 1.4–7.0)
Neutrophils: 54 %
Platelets: 232 10*3/uL (ref 150–450)
RBC: 4.91 x10E6/uL (ref 4.14–5.80)
RDW: 13.5 % (ref 11.6–15.4)
WBC: 10.3 10*3/uL (ref 3.4–10.8)

## 2022-04-07 LAB — MICROALBUMIN / CREATININE URINE RATIO
Creatinine, Urine: 153.1 mg/dL
Microalb/Creat Ratio: 4 mg/g creat (ref 0–29)
Microalbumin, Urine: 6.6 ug/mL

## 2022-04-07 LAB — PSA, TOTAL AND FREE
PSA, Free Pct: 37.5 %
PSA, Free: 0.15 ng/mL
Prostate Specific Ag, Serum: 0.4 ng/mL (ref 0.0–4.0)

## 2022-04-10 ENCOUNTER — Telehealth: Payer: Self-pay | Admitting: Nurse Practitioner

## 2022-04-10 NOTE — Telephone Encounter (Signed)
Returned call to patient--sample left up front for patient to try Encouraged him to call insurance to find out deductible Will likely have to do insulin if not covered or high deductible

## 2022-04-28 NOTE — Telephone Encounter (Signed)
Please review and advise.

## 2022-04-28 NOTE — Telephone Encounter (Signed)
Pt has question on how much of ozempic to take. Please call back

## 2022-04-28 NOTE — Telephone Encounter (Signed)
Instructions were printed and attached to the ozempic box (full color print out) Start 0.'25mg'$  sq weekly x4 weeks to try then..  Patient needs to call his insurance to make sure he can afford/continue therapy.  I told him this during our last phone call :)

## 2022-04-29 NOTE — Telephone Encounter (Signed)
Patient return call. ?

## 2022-04-29 NOTE — Telephone Encounter (Signed)
Pt aware of Tyler Duran recommendations and voiced understanding. He said he will have new insurance starting the first week of December so I did advise him to call and find out about the deductible and pt voiced understanding.

## 2022-07-07 ENCOUNTER — Ambulatory Visit (INDEPENDENT_AMBULATORY_CARE_PROVIDER_SITE_OTHER): Payer: BC Managed Care – PPO | Admitting: Nurse Practitioner

## 2022-07-07 ENCOUNTER — Encounter: Payer: Self-pay | Admitting: Nurse Practitioner

## 2022-07-07 ENCOUNTER — Other Ambulatory Visit: Payer: Self-pay

## 2022-07-07 VITALS — BP 119/76 | HR 87 | Temp 97.8°F | Resp 20 | Ht 71.0 in | Wt 264.0 lb

## 2022-07-07 DIAGNOSIS — I10 Essential (primary) hypertension: Secondary | ICD-10-CM

## 2022-07-07 DIAGNOSIS — E785 Hyperlipidemia, unspecified: Secondary | ICD-10-CM

## 2022-07-07 DIAGNOSIS — E039 Hypothyroidism, unspecified: Secondary | ICD-10-CM | POA: Diagnosis not present

## 2022-07-07 DIAGNOSIS — E119 Type 2 diabetes mellitus without complications: Secondary | ICD-10-CM

## 2022-07-07 DIAGNOSIS — Z6836 Body mass index (BMI) 36.0-36.9, adult: Secondary | ICD-10-CM

## 2022-07-07 DIAGNOSIS — M1000 Idiopathic gout, unspecified site: Secondary | ICD-10-CM

## 2022-07-07 LAB — BAYER DCA HB A1C WAIVED: HB A1C (BAYER DCA - WAIVED): 7.4 % — ABNORMAL HIGH (ref 4.8–5.6)

## 2022-07-07 MED ORDER — ATORVASTATIN CALCIUM 40 MG PO TABS: ORAL_TABLET | ORAL | 1 refills | Status: DC

## 2022-07-07 MED ORDER — SEMAGLUTIDE (1 MG/DOSE) 4 MG/3ML ~~LOC~~ SOPN: 1.0000 mg | PEN_INJECTOR | SUBCUTANEOUS | 2 refills | Status: DC

## 2022-07-07 MED ORDER — LEVOTHYROXINE SODIUM 150 MCG PO TABS: 150.0000 ug | ORAL_TABLET | Freq: Every day | ORAL | 1 refills | Status: DC

## 2022-07-07 MED ORDER — METFORMIN HCL 500 MG PO TABS: 500.0000 mg | ORAL_TABLET | Freq: Two times a day (BID) | ORAL | 1 refills | Status: DC

## 2022-07-07 MED ORDER — GLIMEPIRIDE 4 MG PO TABS: ORAL_TABLET | ORAL | 1 refills | Status: DC

## 2022-07-07 MED ORDER — LISINOPRIL-HYDROCHLOROTHIAZIDE 10-12.5 MG PO TABS: 1.0000 | ORAL_TABLET | Freq: Every day | ORAL | 1 refills | Status: DC

## 2022-07-07 NOTE — Patient Instructions (Signed)

## 2022-07-07 NOTE — Progress Notes (Signed)
Subjective:    Patient ID: Tyler Duran, male    DOB: [birth year], 52 y.o.   MRN: [id number]  Chief Complaint: No chief complaint on file.    HPI:  Tyler Duran is a 52 y.o. who identifies as a male who was assigned male at birth.   Social history: Lives with: wife and kids Work history: owns [employer]   Comes in today for follow up of the following chronic medical issues:  1. Hyperlipidemia with target LDL less than 100 Does not watch diet very closely. No dedicated exercise Lab Results  Component Value Date   CHOL 132 04/06/2022   HDL 34 (L) 04/06/2022   LDLCALC 63 04/06/2022   TRIG 213 (H) 04/06/2022   CHOLHDL 3.9 04/06/2022     2. Primary hypertension No c/o chest pain, sob or headache. Does not check bkood pressure at home. BP Readings from Last 3 Encounters:  04/06/22 106/70  01/05/22 112/72  10/03/21 119/77     3. Type 2 diabetes mellitus without complication, without long-term current use of insulin (HCC) Fasting blood sugars are running around 140-150. Denies any low blood sugars Lab Results  Component Value Date   HGBA1C 8.1 (H) 04/06/2022     4. Acquired hypothyroidism No problems that he is aware of. Lab Results  Component Value Date   TSH 1.970 12/13/2020     5. Idiopathic gout, unspecified chronicity, unspecified site Denies any recent flare ups. Has indomethacin to take if needed  6. Morbid obesity ([place]) Weight is down 3lbs Wt Readings from Last 3 Encounters:  07/07/22 264 lb (119.7 kg)  04/06/22 267 lb (121.1 kg)  01/05/22 263 lb (119.3 kg)   BMI Readings from Last 3 Encounters:  07/07/22 36.82 kg/m  04/06/22 37.24 kg/m  01/05/22 36.68 kg/m      New complaints: None today  No Known Allergies Outpatient Encounter Medications as of 07/07/2022  Medication Sig   atorvastatin (LIPITOR) 40 MG tablet TAKE 1 TABLET(40 MG) BY MOUTH DAILY   glimepiride (AMARYL) 4 MG tablet TAKE 1 TABLET(4 MG) BY MOUTH DAILY WITH  BREAKFAST   glucose blood (ONETOUCH VERIO) test strip Check blood sugar every morning fasting and prn  Dx 250.02   indomethacin (INDOCIN) 50 MG capsule Take 1 capsule (50 mg total) by mouth 3 (three) times daily with meals as needed.   levothyroxine (SYNTHROID) 150 MCG tablet Take 1 tablet (150 mcg total) by mouth daily.   lisinopril-hydrochlorothiazide (ZESTORETIC) 10-12.5 MG tablet Take 1 tablet by mouth daily.   metFORMIN (GLUCOPHAGE) 500 MG tablet Take 1 tablet (500 mg total) by mouth 2 (two) times daily with a meal.   No facility-administered encounter medications on file as of 07/07/2022.    Past Surgical History:  Procedure Laterality Date   APPENDECTOMY     CHOLECYSTECTOMY N/A 07/21/2013   Procedure: LAPAROSCOPIC CHOLECYSTECTOMY;  Surgeon: Jamesetta So, MD;  Location: [facility];  Service: General;  Laterality: N/A;    Family History  Problem Relation Age of Onset   Cancer Mother    Diabetes Father    Colon polyps Father    Colon cancer Sister    Esophageal cancer Neg Hx    Rectal cancer Neg Hx    Stomach cancer Neg Hx       Controlled substance contract: n/a     Review of Systems  Constitutional:  Negative for diaphoresis.  Eyes:  Negative for pain.  Respiratory:  Negative for shortness of breath.   Cardiovascular:  Negative for chest pain, palpitations and leg swelling.  Gastrointestinal:  Negative for abdominal pain.  Endocrine: Negative for polydipsia.  Skin:  Negative for rash.  Neurological:  Negative for dizziness, weakness and headaches.  Hematological:  Does not bruise/bleed easily.  All other systems reviewed and are negative.      Objective:   Physical Exam Vitals and nursing note reviewed.  Constitutional:      Appearance: Normal appearance. He is well-developed.  HENT:     Head: Normocephalic.     Nose: Nose normal.     Mouth/Throat:     Mouth: Mucous membranes are moist.     Pharynx: Oropharynx is clear.  Eyes:     Pupils: Pupils are  equal, round, and reactive to light.  Neck:     Thyroid: No thyroid mass or thyromegaly.     Vascular: No carotid bruit or JVD.     Trachea: Phonation normal.  Cardiovascular:     Rate and Rhythm: Normal rate and regular rhythm.  Pulmonary:     Effort: Pulmonary effort is normal. No respiratory distress.     Breath sounds: Normal breath sounds.  Abdominal:     General: Bowel sounds are normal.     Palpations: Abdomen is soft.     Tenderness: There is no abdominal tenderness.  Musculoskeletal:        General: Normal range of motion.     Cervical back: Normal range of motion and neck supple.  Lymphadenopathy:     Cervical: No cervical adenopathy.  Skin:    General: Skin is warm and dry.  Neurological:     Mental Status: He is alert and oriented to person, place, and time.  Psychiatric:        Behavior: Behavior normal.        Thought Content: Thought content normal.        Judgment: Judgment normal.    BP 119/76   Pulse 87   Temp 97.8 F (36.6 C) (Temporal)   Resp 20   Ht '5\' 11"'$  (1.803 m)   Wt 264 lb (119.7 kg)   SpO2 97%   BMI 36.82 kg/m   HGBA1c 7.4%       Assessment & Plan:   Tyler Duran comes in today with chief complaint of Medical Management of Chronic Issues   Diagnosis and orders addressed:  1. Hyperlipidemia with target LDL less than 100 Low fat diet - Lipid panel - atorvastatin (LIPITOR) 40 MG tablet; TAKE 1 TABLET(40 MG) BY MOUTH DAILY  Dispense: 90 tablet; Refill: 1  2. Primary hypertension Low sodium diet - CMP14+EGFR - CBC with Differential/Platelet  3. Type 2 diabetes mellitus without complication, without long-term current use of insulin (HCC) Continue to watch carbs in diet Increase ozempic to '11mg'$  weekly - Bayer DCA Hb A1c Waived - glimepiride (AMARYL) 4 MG tablet; TAKE 1 TABLET(4 MG) BY MOUTH DAILY WITH BREAKFAST  Dispense: 90 tablet; Refill: 1 - lisinopril-hydrochlorothiazide (ZESTORETIC) 10-12.5 MG tablet; Take 1 tablet by mouth  daily.  Dispense: 90 tablet; Refill: 1 - metFORMIN (GLUCOPHAGE) 500 MG tablet; Take 1 tablet (500 mg total) by mouth 2 (two) times daily with a meal.  Dispense: 90 tablet; Refill: 1 - Semaglutide, 1 MG/DOSE, 4 MG/3ML SOPN; Inject 1 mg as directed once a week.  Dispense: 3 mL; Refill: 2  4. Acquired hypothyroidism Labs pending - levothyroxine (SYNTHROID) 150 MCG tablet; Take 1 tablet (150 mcg total) by mouth daily.  Dispense: 90 tablet; Refill: 1  5. Idiopathic gout, unspecified chronicity, unspecified site Low purine diet  6. Morbid obesity ([place]) Discussed diet and exercise for person with BMI >25 Will recheck weight in 3-6 months    Labs pending Health Maintenance reviewed Diet and exercise encouraged  Follow up plan: 3 months   Mary-Margaret Hassell Done, FNP

## 2022-07-08 ENCOUNTER — Telehealth: Payer: Self-pay | Admitting: Gastroenterology

## 2022-07-08 ENCOUNTER — Encounter: Payer: Self-pay | Admitting: Gastroenterology

## 2022-07-08 LAB — CBC WITH DIFFERENTIAL/PLATELET
Basophils Absolute: 0.1 10*3/uL (ref 0.0–0.2)
Basos: 1 %
EOS (ABSOLUTE): 0.4 10*3/uL (ref 0.0–0.4)
Eos: 3 %
Hematocrit: 41.8 % (ref 37.5–51.0)
Hemoglobin: 14 g/dL (ref 13.0–17.7)
Immature Grans (Abs): 0 10*3/uL (ref 0.0–0.1)
Immature Granulocytes: 0 %
Lymphocytes Absolute: 3.3 10*3/uL — ABNORMAL HIGH (ref 0.7–3.1)
Lymphs: 27 %
MCH: 28.3 pg (ref 26.6–33.0)
MCHC: 33.5 g/dL (ref 31.5–35.7)
MCV: 85 fL (ref 79–97)
Monocytes Absolute: 1 10*3/uL — ABNORMAL HIGH (ref 0.1–0.9)
Monocytes: 8 %
Neutrophils Absolute: 7.4 10*3/uL — ABNORMAL HIGH (ref 1.4–7.0)
Neutrophils: 61 %
Platelets: 245 10*3/uL (ref 150–450)
RBC: 4.94 x10E6/uL (ref 4.14–5.80)
RDW: 13 % (ref 11.6–15.4)
WBC: 12.3 10*3/uL — ABNORMAL HIGH (ref 3.4–10.8)

## 2022-07-08 LAB — LIPID PANEL
Chol/HDL Ratio: 3.8 ratio (ref 0.0–5.0)
Cholesterol, Total: 117 mg/dL (ref 100–199)
HDL: 31 mg/dL — ABNORMAL LOW (ref >39)
LDL Chol Calc (NIH): 50 mg/dL (ref 0–99)
Triglycerides: 223 mg/dL — ABNORMAL HIGH (ref 0–149)
VLDL Cholesterol Cal: 36 mg/dL (ref 5–40)

## 2022-07-08 LAB — CMP14+EGFR
ALT: 38 IU/L (ref 0–44)
AST: 26 IU/L (ref 0–40)
Albumin/Globulin Ratio: 1.7 (ref 1.2–2.2)
Albumin: 4.4 g/dL (ref 3.8–4.9)
Alkaline Phosphatase: 92 IU/L (ref 44–121)
BUN/Creatinine Ratio: 13 (ref 9–20)
BUN: 13 mg/dL (ref 6–24)
Bilirubin Total: 0.4 mg/dL (ref 0.0–1.2)
CO2: 22 mmol/L (ref 20–29)
Calcium: 9.8 mg/dL (ref 8.7–10.2)
Chloride: 101 mmol/L (ref 96–106)
Creatinine, Ser: 1.02 mg/dL (ref 0.76–1.27)
Globulin, Total: 2.6 g/dL (ref 1.5–4.5)
Glucose: 97 mg/dL (ref 70–99)
Potassium: 4.6 mmol/L (ref 3.5–5.2)
Sodium: 141 mmol/L (ref 134–144)
Total Protein: 7 g/dL (ref 6.0–8.5)
eGFR: 88 mL/min/{1.73_m2} (ref >59)

## 2022-07-08 NOTE — Telephone Encounter (Signed)
Patient needs Colonoscopy at this time as had been recommendation of a 3-year followup. Please schedule next available with me in [place]. Thanks. GM

## 2022-07-08 NOTE — Telephone Encounter (Signed)
Dr Rush Landmark the pt received this letter in 2020 to have repeat colon in 3 years. However, the recall was entered for 10 years.  The pt also has a  sister that recently had "cancerous" polyps on recent colon.  Please advise timing of next colon.    Tyler Duran [id number]   Dear Tyler Duran,   The polyps removed from your colon were a combination of tubular adenomas and sessile serrated polyps.  Both of these types of polyps are precancerous polyps meaning that they had the potential to change into cancer over time had they not been removed.  They have been removed.   Based on current guidelines, I recommend you have a repeat colonoscopy in 3 years to determine if you have developed any new precancerous polyps and to screen for colorectal cancer.    If you develop any new rectal bleeding, abdominal pain or significant bowel habit changes, please contact us before then at Dept: [phone].   Please call us if you have persistent problems or have questions about your condition that have not been fully answered at this time.   Sincerely,   Irving Copas., MD

## 2022-07-08 NOTE — Telephone Encounter (Signed)
The pt has been advised that he can set up the colon at this time in the [facility].  I have left a message that he can call and set that up at any time.

## 2022-07-08 NOTE — Telephone Encounter (Signed)
Received a referral for patient to schedule a colonoscopy.  Recall says not due to 2030; however, in the letter he received, it said to come back in three years.  In the interim, his sister had a colonoscopy which has produced cancerous polyps. What do you recommend he do?

## 2022-07-08 NOTE — Telephone Encounter (Signed)
Patient has been scheduled for colonoscopy on 4/2 at 9:30 and PV on 3/12 at 9:00

## 2022-08-18 ENCOUNTER — Ambulatory Visit (AMBULATORY_SURGERY_CENTER): Payer: BC Managed Care – PPO | Admitting: *Deleted

## 2022-08-18 ENCOUNTER — Encounter: Payer: Self-pay | Admitting: Gastroenterology

## 2022-08-18 VITALS — Ht 71.0 in | Wt 260.0 lb

## 2022-08-18 DIAGNOSIS — Z86010 Personal history of colonic polyps: Secondary | ICD-10-CM

## 2022-08-18 MED ORDER — NA SULFATE-K SULFATE-MG SULF 17.5-3.13-1.6 GM/177ML PO SOLN: 1.0000 | Freq: Once | ORAL | 0 refills | Status: AC

## 2022-08-18 NOTE — Progress Notes (Signed)
No egg or soy allergy known to patient  No issues known to pt with past sedation with any surgeries or procedures Patient denies ever being told they had issues or difficulty with intubation  No FH of Malignant Hyperthermia Pt is not on diet pills Pt is not on  home 02  Pt is not on blood thinners  Pt denies issues with constipation  Pt is not on dialysis Pt denies any upcoming cardiac testing Pt encouraged to use to use Singlecare or Goodrx to reduce cost  Patient's chart reviewed by Tyler Duran CNRA prior to previsit and patient appropriate for the [place].  Previsit completed and red dot placed by patient's name on their procedure day (on provider's schedule).  . Visit by phone Instructions reviewed with pt and pt states understanding. Instructed to review again prior to procedure. Pt states they will. 260lb

## 2022-09-01 ENCOUNTER — Telehealth: Payer: Self-pay

## 2022-09-01 NOTE — Telephone Encounter (Signed)
Hayden Apsey (Key: [id number]) Ozempic (0.25 or 0.5 MG/DOSE) 2MG /3ML pen-injectors Form Blue Building control surveyor Form Created 4 days ago Sent to Plan 4 minutes ago Plan Response 4 minutes ago Submit Clinical Questions less than a minute ago Determination Wait for Determination Please wait for [place] 2017 to return a determination.

## 2022-09-04 NOTE — Telephone Encounter (Signed)
Patient Advocate Encounter  Prior Authorization for Ozempic has been approved.    

## 2022-09-08 ENCOUNTER — Encounter: Payer: Self-pay | Admitting: Gastroenterology

## 2022-09-08 ENCOUNTER — Ambulatory Visit (AMBULATORY_SURGERY_CENTER): Payer: BC Managed Care – PPO | Admitting: Gastroenterology

## 2022-09-08 VITALS — BP 114/83 | HR 85 | Temp 98.0°F | Resp 17 | Ht 71.0 in | Wt 260.0 lb

## 2022-09-08 DIAGNOSIS — Z86010 Personal history of colonic polyps: Secondary | ICD-10-CM

## 2022-09-08 DIAGNOSIS — D125 Benign neoplasm of sigmoid colon: Secondary | ICD-10-CM

## 2022-09-08 DIAGNOSIS — Z09 Encounter for follow-up examination after completed treatment for conditions other than malignant neoplasm: Secondary | ICD-10-CM | POA: Diagnosis not present

## 2022-09-08 DIAGNOSIS — D124 Benign neoplasm of descending colon: Secondary | ICD-10-CM

## 2022-09-08 DIAGNOSIS — K635 Polyp of colon: Secondary | ICD-10-CM | POA: Diagnosis not present

## 2022-09-08 DIAGNOSIS — Z800 Family history of malignant neoplasm of digestive organs: Secondary | ICD-10-CM

## 2022-09-08 DIAGNOSIS — Z1211 Encounter for screening for malignant neoplasm of colon: Secondary | ICD-10-CM | POA: Diagnosis not present

## 2022-09-08 HISTORY — PX: COLONOSCOPY WITH PROPOFOL: SHX5780

## 2022-09-08 MED ORDER — SODIUM CHLORIDE 0.9 % IV SOLN: 500.0000 mL | Freq: Once | INTRAVENOUS | Status: DC

## 2022-09-08 NOTE — Progress Notes (Signed)
GASTROENTEROLOGY PROCEDURE H&P NOTE   Primary Care Physician: Chevis Pretty, FNP  HPI: Tyler Duran is a 52 y.o. male who presents for Colonoscopy for surveillance in setting of previous adenomas.  Past Medical History:  Diagnosis Date   Arthritis    right ankle   Chronic kidney disease    kidney stones   Diabetes mellitus without complication    Hyperlipidemia    Hypertension    Hypothyroidism    Thyroid disease    Past Surgical History:  Procedure Laterality Date   APPENDECTOMY     CHOLECYSTECTOMY N/A 07/21/2013   Procedure: LAPAROSCOPIC CHOLECYSTECTOMY;  Surgeon: Jamesetta So, MD;  Location: [facility];  Service: General;  Laterality: N/A;   COLONOSCOPY Bilateral    COLONOSCOPY WITH PROPOFOL  09/08/2022   Current Outpatient Medications  Medication Sig Dispense Refill   atorvastatin (LIPITOR) 40 MG tablet TAKE 1 TABLET(40 MG) BY MOUTH DAILY 90 tablet 1   glimepiride (AMARYL) 4 MG tablet TAKE 1 TABLET(4 MG) BY MOUTH DAILY WITH BREAKFAST 90 tablet 1   glucose blood (ONETOUCH VERIO) test strip Check blood sugar every morning fasting and prn  Dx 250.02 100 each 12   levothyroxine (SYNTHROID) 150 MCG tablet Take 1 tablet (150 mcg total) by mouth daily. 90 tablet 1   lisinopril-hydrochlorothiazide (ZESTORETIC) 10-12.5 MG tablet Take 1 tablet by mouth daily. 90 tablet 1   metFORMIN (GLUCOPHAGE) 500 MG tablet Take 1 tablet (500 mg total) by mouth 2 (two) times daily with a meal. 90 tablet 1   indomethacin (INDOCIN) 50 MG capsule Take 1 capsule (50 mg total) by mouth 3 (three) times daily with meals as needed. (Patient not taking: Reported on 08/18/2022) 30 capsule 3   Semaglutide, 1 MG/DOSE, 4 MG/3ML SOPN Inject 1 mg as directed once a week. 3 mL 2   Current Facility-Administered Medications  Medication Dose Route Frequency Provider Last Rate Last Admin   0.9 %  sodium chloride infusion  500 mL Intravenous Once Mansouraty, Telford Nab., MD        Current Outpatient  Medications:    atorvastatin (LIPITOR) 40 MG tablet, TAKE 1 TABLET(40 MG) BY MOUTH DAILY, Disp: 90 tablet, Rfl: 1   glimepiride (AMARYL) 4 MG tablet, TAKE 1 TABLET(4 MG) BY MOUTH DAILY WITH BREAKFAST, Disp: 90 tablet, Rfl: 1   glucose blood (ONETOUCH VERIO) test strip, Check blood sugar every morning fasting and prn  Dx 250.02, Disp: 100 each, Rfl: 12   levothyroxine (SYNTHROID) 150 MCG tablet, Take 1 tablet (150 mcg total) by mouth daily., Disp: 90 tablet, Rfl: 1   lisinopril-hydrochlorothiazide (ZESTORETIC) 10-12.5 MG tablet, Take 1 tablet by mouth daily., Disp: 90 tablet, Rfl: 1   metFORMIN (GLUCOPHAGE) 500 MG tablet, Take 1 tablet (500 mg total) by mouth 2 (two) times daily with a meal., Disp: 90 tablet, Rfl: 1   indomethacin (INDOCIN) 50 MG capsule, Take 1 capsule (50 mg total) by mouth 3 (three) times daily with meals as needed. (Patient not taking: Reported on 08/18/2022), Disp: 30 capsule, Rfl: 3   Semaglutide, 1 MG/DOSE, 4 MG/3ML SOPN, Inject 1 mg as directed once a week., Disp: 3 mL, Rfl: 2  Current Facility-Administered Medications:    0.9 %  sodium chloride infusion, 500 mL, Intravenous, Once, Mansouraty, Telford Nab., MD No Known Allergies Family History  Problem Relation Age of Onset   Cancer Mother    Diabetes Father    Colon polyps Father    Colon cancer Sister    Esophageal cancer  Neg Hx    Rectal cancer Neg Hx    Stomach cancer Neg Hx    Social History   Socioeconomic History   Marital status: Married    Spouse name: Not on file   Number of children: Not on file   Years of education: Not on file   Highest education level: Not on file  Occupational History   Not on file  Tobacco Use   Smoking status: Former    Types: Cigarettes   Smokeless tobacco: Never  Vaping Use   Vaping Use: Never used  Substance and Sexual Activity   Alcohol use: Yes    Comment: rare   Drug use: No   Sexual activity: Yes    Birth control/protection: None  Other Topics Concern   Not  on file  Social History Narrative   Not on file   Social Determinants of Health   Financial Resource Strain: Not on file  Food Insecurity: Not on file  Transportation Needs: Not on file  Physical Activity: Not on file  Stress: Not on file  Social Connections: Not on file  Intimate Partner Violence: Not on file    Physical Exam: Today's Vitals   09/08/22 0856  BP: 114/66  Pulse: 94  Temp: 98 F (36.7 C)  TempSrc: Temporal  SpO2: 95%  Weight: 260 lb (117.9 kg)  Height: 5\' 11"  (1.803 m)   Body mass index is 36.26 kg/m. GEN: NAD EYE: Sclerae anicteric ENT: MMM CV: Non-tachycardic GI: Soft, NT/ND NEURO:  Alert & Oriented x 3  Lab Results: No results for input(s): "WBC", "HGB", "HCT", "PLT" in the last 72 hours. BMET No results for input(s): "NA", "K", "CL", "CO2", "GLUCOSE", "BUN", "CREATININE", "CALCIUM" in the last 72 hours. LFT No results for input(s): "PROT", "ALBUMIN", "AST", "ALT", "ALKPHOS", "BILITOT", "BILIDIR", "IBILI" in the last 72 hours. PT/INR No results for input(s): "LABPROT", "INR" in the last 72 hours.   Impression / Plan: This is a 52 y.o.male who presents for Colonoscopy for surveillance in setting of previous adenomas.  The risks and benefits of endoscopic evaluation/treatment were discussed with the patient and/or family; these include but are not limited to the risk of perforation, infection, bleeding, missed lesions, lack of diagnosis, severe illness requiring hospitalization, as well as anesthesia and sedation related illnesses.  The patient's history has been reviewed, patient examined, no change in status, and deemed stable for procedure.  The patient and/or family is agreeable to proceed.    Justice Britain, MD [facility] Office # [id number]

## 2022-09-08 NOTE — Progress Notes (Signed)
Pt's states no medical or surgical changes since previsit or office visit. 

## 2022-09-08 NOTE — Progress Notes (Signed)
Called to room to assist during endoscopic procedure.  Patient ID and intended procedure confirmed with present staff. Received instructions for my participation in the procedure from the performing physician.  

## 2022-09-08 NOTE — Progress Notes (Signed)
PT taken to PACU. Monitors in place. VSS. Report given to RN. 

## 2022-09-08 NOTE — Op Note (Signed)
[place] Patient Name: Tyler Duran Procedure Date: 09/08/2022 9:27 AM MRN: [id number] Endoscopist: Justice Britain , MD, [id number] Age: 52 Referring MD:  Date of Birth: Aug 26, 1970 Gender: Male Account #: [id number] Procedure:                Colonoscopy Indications:              Screening in patient at increased risk: Family                            history of 1st-degree relative with colorectal                            cancer before age 42 years (sister recently                            diagnosed within the last year), Surveillance:                            Personal history of adenomatous polyps on last                            colonoscopy > 3 years ago Medicines:                Monitored Anesthesia Care Procedure:                Pre-Anesthesia Assessment:                           - Prior to the procedure, a History and Physical                            was performed, and patient medications and                            allergies were reviewed. The patient's tolerance of                            previous anesthesia was also reviewed. The risks                            and benefits of the procedure and the sedation                            options and risks were discussed with the patient.                            All questions were answered, and informed consent                            was obtained. Prior Anticoagulants: The patient has                            taken no anticoagulant or antiplatelet agents  except for NSAID medication. ASA Grade Assessment:                            II - A patient with mild systemic disease. After                            reviewing the risks and benefits, the patient was                            deemed in satisfactory condition to undergo the                            procedure.                           After obtaining informed consent, the colonoscope                             was passed under direct vision. Throughout the                            procedure, the patient's blood pressure, pulse, and                            oxygen saturations were monitored continuously. The                            Olympus CF-HQ190L ([id number]) Colonoscope was                            introduced through the anus and advanced to the the                            cecum, identified by appendiceal orifice and                            ileocecal valve. The colonoscopy was unusually                            difficult due to a redundant colon and significant                            looping. Successful completion of the procedure was                            aided by changing the patient to a supine position,                            using manual pressure, straightening and shortening                            the scope to obtain bowel loop reduction and using  scope torsion. The patient tolerated the procedure.                            The quality of the bowel preparation was adequate.                            The ileocecal valve, appendiceal orifice, and                            rectum were photographed. Scope In: 9:32:33 AM Scope Out: 10:03:01 AM Scope Withdrawal Time: 0 hours 16 minutes 12 seconds  Total Procedure Duration: 0 hours 30 minutes 28 seconds  Findings:                 The digital rectal exam findings include                            hemorrhoids. Pertinent negatives include no                            palpable rectal lesions.                           The colon (entire examined portion) revealed                            grossly excessive looping.                           Four sessile polyps were found in the sigmoid colon                            (3) and descending colon (1). The polyps were 2 to                            4 mm in size. These polyps were removed with a cold                             snare. Resection and retrieval were complete.                           Normal mucosa was found in the entire colon                            otherwise.                           Non-bleeding non-thrombosed external and internal                            hemorrhoids were found during retroflexion, during                            perianal exam and during digital exam. The  hemorrhoids were Grade II (internal hemorrhoids                            that prolapse but reduce spontaneously). Complications:            No immediate complications. Estimated Blood Loss:     Estimated blood loss was minimal. Impression:               - Hemorrhoids found on digital rectal exam.                           - There was significant looping of the colon.                           - Four, 2 to 4 mm polyps in the sigmoid colon and                            in the descending colon, removed with a cold snare.                            Resected and retrieved.                           - Normal mucosa in the entire examined colon.                           - Non-bleeding non-thrombosed external and internal                            hemorrhoids. Recommendation:           - The patient will be observed post-procedure,                            until all discharge criteria are met.                           - Discharge patient to home.                           - Patient has a contact number available for                            emergencies. The signs and symptoms of potential                            delayed complications were discussed with the                            patient. Return to normal activities tomorrow.                            Written discharge instructions were provided to the                            patient.                           -  High fiber diet.                           - Use FiberCon 1-2 tablets PO daily.                           - Continue  present medications.                           - Await pathology results.                           - Repeat colonoscopy in 3 - 5 years for                            surveillance based on pathology results. No matter                            what pathology shows, due to patient's family                            history that has been updated, he will need to be                            on at least 5-year colonoscopies moving forward.                            Recommend patient have abdominal binder placed                            before next procedure.                           - The findings and recommendations were discussed                            with the patient.                           - The findings and recommendations were discussed                            with the patient's family. Justice Britain, MD 09/08/2022 10:15:37 AM

## 2022-09-08 NOTE — Patient Instructions (Signed)
-   High fiber diet. - Use FiberCon 1-2 tablets PO daily. - Continue present medications. - Await pathology results. - Repeat colonoscopy in 3 - 5 years for surveillance based on pathology results.   YOU HAD AN ENDOSCOPIC PROCEDURE TODAY AT [facility]:   Refer to the procedure report that was given to you for any specific questions about what was found during the examination.  If the procedure report does not answer your questions, please call your gastroenterologist to clarify.  If you requested that your care partner not be given the details of your procedure findings, then the procedure report has been included in a sealed envelope for you to review at your convenience later.  YOU SHOULD EXPECT: Some feelings of bloating in the abdomen. Passage of more gas than usual.  Walking can help get rid of the air that was put into your GI tract during the procedure and reduce the bloating. If you had a lower endoscopy (such as a colonoscopy or flexible sigmoidoscopy) you may notice spotting of blood in your stool or on the toilet paper. If you underwent a bowel prep for your procedure, you may not have a normal bowel movement for a few days.  Please Note:  You might notice some irritation and congestion in your nose or some drainage.  This is from the oxygen used during your procedure.  There is no need for concern and it should clear up in a day or so.  SYMPTOMS TO REPORT IMMEDIATELY:  Following lower endoscopy (colonoscopy or flexible sigmoidoscopy):  Excessive amounts of blood in the stool  Significant tenderness or worsening of abdominal pains  Swelling of the abdomen that is new, acute  Fever of 100F or higher  For urgent or emergent issues, a gastroenterologist can be reached at any hour by calling [phone]. Do not use MyChart messaging for urgent concerns.    DIET:  We do recommend a small meal at first, but then you may proceed to your regular diet.  Drink plenty of  fluids but you should avoid alcoholic beverages for 24 hours.  ACTIVITY:  You should plan to take it easy for the rest of today and you should NOT DRIVE or use heavy machinery until tomorrow (because of the sedation medicines used during the test).    FOLLOW UP: Our staff will call the number listed on your records the next business day following your procedure.  We will call around 7:15- 8:00 am to check on you and address any questions or concerns that you may have regarding the information given to you following your procedure. If we do not reach you, we will leave a message.     If any biopsies were taken you will be contacted by phone or by letter within the next 1-3 weeks.  Please call us at [phone] if you have not heard about the biopsies in 3 weeks.    SIGNATURES/CONFIDENTIALITY: You and/or your care partner have signed paperwork which will be entered into your electronic medical record.  These signatures attest to the fact that that the information above on your After Visit Summary has been reviewed and is understood.  Full responsibility of the confidentiality of this discharge information lies with you and/or your care-partner.

## 2022-09-09 ENCOUNTER — Telehealth: Payer: Self-pay | Admitting: *Deleted

## 2022-09-09 NOTE — Telephone Encounter (Signed)
Attempted to call patient for their post-procedure follow-up call. No answer. Left voicemail.   

## 2022-09-14 ENCOUNTER — Encounter: Payer: Self-pay | Admitting: Gastroenterology

## 2022-09-24 ENCOUNTER — Other Ambulatory Visit: Payer: Self-pay | Admitting: Nurse Practitioner

## 2022-10-10 ENCOUNTER — Other Ambulatory Visit: Payer: Self-pay | Admitting: Nurse Practitioner

## 2022-10-10 DIAGNOSIS — E119 Type 2 diabetes mellitus without complications: Secondary | ICD-10-CM

## 2022-10-12 ENCOUNTER — Encounter: Payer: Self-pay | Admitting: Nurse Practitioner

## 2022-10-12 ENCOUNTER — Ambulatory Visit (INDEPENDENT_AMBULATORY_CARE_PROVIDER_SITE_OTHER): Payer: BC Managed Care – PPO | Admitting: Nurse Practitioner

## 2022-10-12 VITALS — BP 97/64 | HR 86 | Temp 97.8°F | Resp 20 | Ht 71.0 in | Wt 257.0 lb

## 2022-10-12 DIAGNOSIS — I10 Essential (primary) hypertension: Secondary | ICD-10-CM

## 2022-10-12 DIAGNOSIS — E1169 Type 2 diabetes mellitus with other specified complication: Secondary | ICD-10-CM | POA: Diagnosis not present

## 2022-10-12 DIAGNOSIS — E785 Hyperlipidemia, unspecified: Secondary | ICD-10-CM

## 2022-10-12 DIAGNOSIS — M1000 Idiopathic gout, unspecified site: Secondary | ICD-10-CM | POA: Diagnosis not present

## 2022-10-12 DIAGNOSIS — Z7985 Long-term (current) use of injectable non-insulin antidiabetic drugs: Secondary | ICD-10-CM

## 2022-10-12 DIAGNOSIS — Z6835 Body mass index (BMI) 35.0-35.9, adult: Secondary | ICD-10-CM

## 2022-10-12 DIAGNOSIS — Z7984 Long term (current) use of oral hypoglycemic drugs: Secondary | ICD-10-CM

## 2022-10-12 DIAGNOSIS — E039 Hypothyroidism, unspecified: Secondary | ICD-10-CM

## 2022-10-12 LAB — BAYER DCA HB A1C WAIVED: HB A1C (BAYER DCA - WAIVED): 6.5 % — ABNORMAL HIGH (ref 4.8–5.6)

## 2022-10-12 MED ORDER — LISINOPRIL-HYDROCHLOROTHIAZIDE 10-12.5 MG PO TABS: 1.0000 | ORAL_TABLET | Freq: Every day | ORAL | 1 refills | Status: DC

## 2022-10-12 MED ORDER — SEMAGLUTIDE (1 MG/DOSE) 4 MG/3ML ~~LOC~~ SOPN: 1.0000 mg | PEN_INJECTOR | SUBCUTANEOUS | 2 refills | Status: DC

## 2022-10-12 MED ORDER — METFORMIN HCL 500 MG PO TABS
500.0000 mg | ORAL_TABLET | Freq: Two times a day (BID) | ORAL | 1 refills | Status: DC
Start: 2022-10-12 — End: 2023-03-29

## 2022-10-12 MED ORDER — NYSTATIN 100000 UNIT/GM EX CREA: 1.0000 | TOPICAL_CREAM | Freq: Two times a day (BID) | CUTANEOUS | 3 refills | Status: DC

## 2022-10-12 MED ORDER — INDOMETHACIN 50 MG PO CAPS
50.0000 mg | ORAL_CAPSULE | Freq: Three times a day (TID) | ORAL | 3 refills | Status: DC | PRN
Start: 2022-10-12 — End: 2024-01-19

## 2022-10-12 MED ORDER — GLIMEPIRIDE 4 MG PO TABS: ORAL_TABLET | ORAL | 1 refills | Status: DC

## 2022-10-12 MED ORDER — ATORVASTATIN CALCIUM 40 MG PO TABS: ORAL_TABLET | ORAL | 1 refills | Status: DC

## 2022-10-12 MED ORDER — LEVOTHYROXINE SODIUM 150 MCG PO TABS
150.0000 ug | ORAL_TABLET | Freq: Every day | ORAL | 1 refills | Status: DC
Start: 2022-10-12 — End: 2023-04-13

## 2022-10-12 NOTE — Progress Notes (Signed)
Subjective:    Patient ID: Tyler Duran, male    DOB: 05/21/1971, 52 y.o.   MRN: [id number]   Chief Complaint: medical management of chronic issues     HPI:  Tyler Duran is a 52 y.o. who identifies as a male who was assigned male at birth.   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   Comes in today for follow up of the following chronic medical issues:  1. Primary hypertension No c/o chest pain, sob or headache. Does not check blood pressure at home. BP Readings from Last 3 Encounters:  09/08/22 114/83  07/07/22 119/76  04/06/22 106/70     2. Hyperlipidemia associated with type 2 diabetes mellitus (HCC) Does not watch diet and does no dedicated exercise. Lab Results  Component Value Date   CHOL 117 07/07/2022   HDL 31 (L) 07/07/2022   LDLCALC 50 07/07/2022   TRIG 223 (H) 07/07/2022   CHOLHDL 3.8 07/07/2022   The ASCVD Risk score (Arnett DK, et al., 2019) failed to calculate for the following reasons:   The valid total cholesterol range is 130 to 320 mg/dL   3. Type 2 diabetes mellitus with other specified complication, without long-term current use of insulin (HCC) Fasting blood sugars are running around 160 on average. Lab Results  Component Value Date   HGBA1C 7.4 (H) 07/07/2022    4. Acquired hypothyroidism No issues that he is aware of. Lab Results  Component Value Date   TSH 1.970 12/13/2020     5. Idiopathic gout, unspecified chronicity, unspecified site No recent gout flare ups  6. Morbid obesity (HCC) No recent weight changes. Wt Readings from Last 3 Encounters:  10/12/22 257 lb (116.6 kg)  09/08/22 260 lb (117.9 kg)  08/18/22 260 lb (117.9 kg)   BMI Readings from Last 3 Encounters:  10/12/22 35.84 kg/m  09/08/22 36.26 kg/m  08/18/22 36.26 kg/m     New complaints: Bil axillary areas are red and itching  No Known Allergies Outpatient Encounter  Medications as of 10/12/2022  Medication Sig   atorvastatin (LIPITOR) 40 MG tablet TAKE 1 TABLET(40 MG) BY MOUTH DAILY   glimepiride (AMARYL) 4 MG tablet TAKE 1 TABLET(4 MG) BY MOUTH DAILY WITH BREAKFAST   glucose blood (ONETOUCH VERIO) test strip Check blood sugar every morning fasting and prn  Dx 250.02   indomethacin (INDOCIN) 50 MG capsule Take 1 capsule (50 mg total) by mouth 3 (three) times daily with meals as needed. (Patient not taking: Reported on 08/18/2022)   levothyroxine (SYNTHROID) 150 MCG tablet Take 1 tablet (150 mcg total) by mouth daily.   lisinopril-hydrochlorothiazide (ZESTORETIC) 10-12.5 MG tablet Take 1 tablet by mouth daily.   metFORMIN (GLUCOPHAGE) 500 MG tablet Take 1 tablet (500 mg total) by mouth 2 (two) times daily with a meal.   Semaglutide, 1 MG/DOSE, 4 MG/3ML SOPN Inject 1 mg as directed once a week.   No facility-administered encounter medications on file as of 10/12/2022.    Past Surgical History:  Procedure Laterality Date   APPENDECTOMY     CHOLECYSTECTOMY N/A 07/21/2013   Procedure: LAPAROSCOPIC CHOLECYSTECTOMY;  Surgeon: Dalia Heading, MD;  Location: [facility];  Service: General;  Laterality: N/A;   COLONOSCOPY Bilateral    COLONOSCOPY WITH PROPOFOL  09/08/2022    Family History  Problem Relation Age of Onset   Cancer Mother    Diabetes Father    Colon polyps Father  Colon cancer Sister    Esophageal cancer Neg Hx    Rectal cancer Neg Hx    Stomach cancer Neg Hx       Controlled substance contract: n/a     Review of Systems  Constitutional:  Negative for diaphoresis.  Eyes:  Negative for pain.  Respiratory:  Negative for shortness of breath.   Cardiovascular:  Negative for chest pain, palpitations and leg swelling.  Gastrointestinal:  Negative for abdominal pain.  Endocrine: Negative for polydipsia.  Skin:  Negative for rash.  Neurological:  Negative for dizziness, weakness and headaches.  Hematological:  Does not bruise/bleed easily.   All other systems reviewed and are negative.      Objective:   Physical Exam Vitals and nursing note reviewed.  Constitutional:      Appearance: Normal appearance. He is well-developed.  HENT:     Head: Normocephalic.     Nose: Nose normal.     Mouth/Throat:     Mouth: Mucous membranes are moist.     Pharynx: Oropharynx is clear.  Eyes:     Pupils: Pupils are equal, round, and reactive to light.  Neck:     Thyroid: No thyroid mass or thyromegaly.     Vascular: No carotid bruit or JVD.     Trachea: Phonation normal.  Cardiovascular:     Rate and Rhythm: Normal rate and regular rhythm.  Pulmonary:     Effort: Pulmonary effort is normal. No respiratory distress.     Breath sounds: Normal breath sounds.  Abdominal:     General: Bowel sounds are normal.     Palpations: Abdomen is soft.     Tenderness: There is no abdominal tenderness.  Musculoskeletal:        General: Normal range of motion.     Cervical back: Normal range of motion and neck supple.  Lymphadenopathy:     Cervical: No cervical adenopathy.  Skin:    General: Skin is warm and dry.  Neurological:     Mental Status: He is alert and oriented to person, place, and time.  Psychiatric:        Behavior: Behavior normal.        Thought Content: Thought content normal.        Judgment: Judgment normal.     BP 97/64   Pulse 86   Temp 97.8 F (36.6 C) (Temporal)   Resp 20   Ht 5\' 11"  (1.803 m)   Wt 257 lb (116.6 kg)   SpO2 96%   BMI 35.84 kg/m   HGBA1c 6.5%      Assessment & Plan:   Tyler Duran comes in today with chief complaint of Medical Management of Chronic Issues   Diagnosis and orders addressed:  1. Primary hypertension Low sodium diet - CBC with Differential/Platelet - CMP14+EGFR  2. Hyperlipidemia associated with type 2 diabetes mellitus (HCC) Low fat diet - Lipid panel - atorvastatin (LIPITOR) 40 MG tablet; TAKE 1 TABLET(40 MG) BY MOUTH DAILY  Dispense: 90 tablet; Refill:  1  3. Type 2 diabetes mellitus with other specified complication, without long-term current use of insulin (HCC) Continue to watch carbs in diet - Bayer DCA Hb A1c Waived - glimepiride (AMARYL) 4 MG tablet; TAKE 1 TABLET(4 MG) BY MOUTH DAILY WITH BREAKFAST  Dispense: 90 tablet; Refill: 1 - lisinopril-hydrochlorothiazide (ZESTORETIC) 10-12.5 MG tablet; Take 1 tablet by mouth daily.  Dispense: 90 tablet; Refill: 1 - metFORMIN (GLUCOPHAGE) 500 MG tablet; Take 1 tablet (500 mg total) by  mouth 2 (two) times daily with a meal.  Dispense: 90 tablet; Refill: 1 - Semaglutide, 1 MG/DOSE, 4 MG/3ML SOPN; Inject 1 mg as directed once a week.  Dispense: 3 mL; Refill: 2  4. Acquired hypothyroidism Labs pending - Thyroid Panel With TSH - levothyroxine (SYNTHROID) 150 MCG tablet; Take 1 tablet (150 mcg total) by mouth daily.  Dispense: 90 tablet; Refill: 1  5. Idiopathic gout, unspecified chronicity, unspecified site Low purine diet - indomethacin (INDOCIN) 50 MG capsule; Take 1 capsule (50 mg total) by mouth 3 (three) times daily with meals as needed.  Dispense: 30 capsule; Refill: 3  6. Morbid obesity (HCC) Discussed diet and exercise for person with BMI >25 Will recheck weight in 3-6 months    Labs pending Health Maintenance reviewed Diet and exercise encouraged  Follow up plan: 6 months   [name], FNP

## 2022-10-12 NOTE — Patient Instructions (Signed)

## 2022-10-12 NOTE — Addendum Note (Signed)
Addended by: Bennie Pierini on: 10/12/2022 09:11 AM   Modules accepted: Orders

## 2022-10-13 LAB — CBC WITH DIFFERENTIAL/PLATELET
Basophils Absolute: 0.1 10*3/uL (ref 0.0–0.2)
Basos: 1 %
EOS (ABSOLUTE): 0.5 10*3/uL — ABNORMAL HIGH (ref 0.0–0.4)
Eos: 5 %
Hematocrit: 41.4 % (ref 37.5–51.0)
Hemoglobin: 13.6 g/dL (ref 13.0–17.7)
Immature Grans (Abs): 0 10*3/uL (ref 0.0–0.1)
Immature Granulocytes: 0 %
Lymphocytes Absolute: 2.7 10*3/uL (ref 0.7–3.1)
Lymphs: 29 %
MCH: 27.5 pg (ref 26.6–33.0)
MCHC: 32.9 g/dL (ref 31.5–35.7)
MCV: 84 fL (ref 79–97)
Monocytes Absolute: 0.8 10*3/uL (ref 0.1–0.9)
Monocytes: 9 %
Neutrophils Absolute: 5 10*3/uL (ref 1.4–7.0)
Neutrophils: 56 %
Platelets: 232 10*3/uL (ref 150–450)
RBC: 4.94 x10E6/uL (ref 4.14–5.80)
RDW: 12.9 % (ref 11.6–15.4)
WBC: 9.2 10*3/uL (ref 3.4–10.8)

## 2022-10-13 LAB — LIPID PANEL
Chol/HDL Ratio: 3.7 ratio (ref 0.0–5.0)
Cholesterol, Total: 118 mg/dL (ref 100–199)
HDL: 32 mg/dL — ABNORMAL LOW (ref >39)
LDL Chol Calc (NIH): 57 mg/dL (ref 0–99)
Triglycerides: 175 mg/dL — ABNORMAL HIGH (ref 0–149)
VLDL Cholesterol Cal: 29 mg/dL (ref 5–40)

## 2022-10-13 LAB — CMP14+EGFR
ALT: 19 IU/L (ref 0–44)
AST: 17 IU/L (ref 0–40)
Albumin/Globulin Ratio: 1.8 (ref 1.2–2.2)
Albumin: 4.1 g/dL (ref 3.8–4.9)
Alkaline Phosphatase: 87 IU/L (ref 44–121)
BUN/Creatinine Ratio: 14 (ref 9–20)
BUN: 16 mg/dL (ref 6–24)
Bilirubin Total: 0.5 mg/dL (ref 0.0–1.2)
CO2: 25 mmol/L (ref 20–29)
Calcium: 9.3 mg/dL (ref 8.7–10.2)
Chloride: 101 mmol/L (ref 96–106)
Creatinine, Ser: 1.17 mg/dL (ref 0.76–1.27)
Globulin, Total: 2.3 g/dL (ref 1.5–4.5)
Glucose: 99 mg/dL (ref 70–99)
Potassium: 4.4 mmol/L (ref 3.5–5.2)
Sodium: 138 mmol/L (ref 134–144)
Total Protein: 6.4 g/dL (ref 6.0–8.5)
eGFR: 75 mL/min/{1.73_m2} (ref >59)

## 2022-10-13 LAB — THYROID PANEL WITH TSH
Free Thyroxine Index: 2.3 (ref 1.2–4.9)
T3 Uptake Ratio: 29 % (ref 24–39)
T4, Total: 7.8 ug/dL (ref 4.5–12.0)
TSH: 1.82 u[IU]/mL (ref 0.450–4.500)

## 2022-11-06 DIAGNOSIS — E1169 Type 2 diabetes mellitus with other specified complication: Secondary | ICD-10-CM

## 2022-11-06 MED ORDER — SEMAGLUTIDE (1 MG/DOSE) 4 MG/3ML ~~LOC~~ SOPN
1.0000 mg | PEN_INJECTOR | SUBCUTANEOUS | 0 refills | Status: DC
Start: 2022-11-06 — End: 2022-12-29

## 2022-12-19 ENCOUNTER — Other Ambulatory Visit (HOSPITAL_COMMUNITY): Payer: Self-pay

## 2022-12-28 ENCOUNTER — Other Ambulatory Visit: Payer: Self-pay | Admitting: Nurse Practitioner

## 2022-12-28 DIAGNOSIS — E1169 Type 2 diabetes mellitus with other specified complication: Secondary | ICD-10-CM

## 2023-01-26 ENCOUNTER — Other Ambulatory Visit: Payer: Self-pay | Admitting: Nurse Practitioner

## 2023-01-26 DIAGNOSIS — E1169 Type 2 diabetes mellitus with other specified complication: Secondary | ICD-10-CM

## 2023-02-15 ENCOUNTER — Telehealth: Payer: Self-pay

## 2023-02-15 NOTE — Telephone Encounter (Signed)
Information regarding your request  Your PA has been resolved, no additional PA is required. For further inquiries please contact the number on the back of the member prescription card. (Message [phone])

## 2023-03-26 ENCOUNTER — Other Ambulatory Visit: Payer: Self-pay | Admitting: Nurse Practitioner

## 2023-03-26 DIAGNOSIS — E1169 Type 2 diabetes mellitus with other specified complication: Secondary | ICD-10-CM

## 2023-04-13 ENCOUNTER — Encounter: Payer: Self-pay | Admitting: Nurse Practitioner

## 2023-04-13 ENCOUNTER — Ambulatory Visit: Payer: 59 | Admitting: Nurse Practitioner

## 2023-04-13 VITALS — BP 92/60 | HR 85 | Temp 98.3°F | Resp 20 | Ht 71.0 in | Wt 257.0 lb

## 2023-04-13 DIAGNOSIS — I10 Essential (primary) hypertension: Secondary | ICD-10-CM

## 2023-04-13 DIAGNOSIS — E039 Hypothyroidism, unspecified: Secondary | ICD-10-CM | POA: Diagnosis not present

## 2023-04-13 DIAGNOSIS — E785 Hyperlipidemia, unspecified: Secondary | ICD-10-CM

## 2023-04-13 DIAGNOSIS — M1000 Idiopathic gout, unspecified site: Secondary | ICD-10-CM

## 2023-04-13 DIAGNOSIS — E1169 Type 2 diabetes mellitus with other specified complication: Secondary | ICD-10-CM

## 2023-04-13 DIAGNOSIS — Z7984 Long term (current) use of oral hypoglycemic drugs: Secondary | ICD-10-CM

## 2023-04-13 DIAGNOSIS — M25512 Pain in left shoulder: Secondary | ICD-10-CM

## 2023-04-13 LAB — BAYER DCA HB A1C WAIVED: HB A1C (BAYER DCA - WAIVED): 6.7 % — ABNORMAL HIGH (ref 4.8–5.6)

## 2023-04-13 MED ORDER — DICLOFENAC SODIUM 75 MG PO TBEC: 75.0000 mg | DELAYED_RELEASE_TABLET | Freq: Two times a day (BID) | ORAL | 2 refills | Status: DC

## 2023-04-13 MED ORDER — METFORMIN HCL 500 MG PO TABS
500.0000 mg | ORAL_TABLET | Freq: Two times a day (BID) | ORAL | 0 refills | Status: DC
Start: 2023-04-13 — End: 2023-07-05

## 2023-04-13 MED ORDER — ATORVASTATIN CALCIUM 40 MG PO TABS
ORAL_TABLET | ORAL | 1 refills | Status: DC
Start: 2023-04-13 — End: 2023-10-12

## 2023-04-13 MED ORDER — GLIMEPIRIDE 4 MG PO TABS
ORAL_TABLET | ORAL | 1 refills | Status: DC
Start: 2023-04-13 — End: 2023-10-12

## 2023-04-13 MED ORDER — LEVOTHYROXINE SODIUM 150 MCG PO TABS: 150.0000 ug | ORAL_TABLET | Freq: Every day | ORAL | 1 refills | Status: DC

## 2023-04-13 MED ORDER — OZEMPIC (1 MG/DOSE) 4 MG/3ML ~~LOC~~ SOPN: 1.0000 mg | PEN_INJECTOR | SUBCUTANEOUS | 1 refills | Status: DC

## 2023-04-13 MED ORDER — LISINOPRIL-HYDROCHLOROTHIAZIDE 10-12.5 MG PO TABS: 1.0000 | ORAL_TABLET | Freq: Every day | ORAL | 1 refills | Status: DC

## 2023-04-13 NOTE — Patient Instructions (Signed)
Shoulder Pain Many things can cause shoulder pain, including: An injury. Moving the shoulder in the same way again and again (overuse). Joint pain (arthritis). Pain can come from: Swelling and irritation (inflammation) of any part of the shoulder. An injury to: The shoulder joint. Tissues that connect muscle to bone (tendons). Tissues that connect bones to each other (ligaments). Bones. Follow these instructions at home: Watch for changes in your symptoms. Let your doctor know about them. Follow these instructions to help with your pain. If you have a sling that can be taken off: Wear the sling as told by your doctor. Take it off only as told by your doctor. Check the skin around the sling every day. Tell your doctor if you see problems. Loosen the sling if your fingers: Tingle. Become numb. Become cold. Keep the sling clean. If the sling is not waterproof: Do not let it get wet. Take the sling off when you shower or bathe. Managing pain, stiffness, and swelling  If told, put ice on the painful area. Put ice in a plastic bag. Place a towel between your skin and the bag. Leave the ice on for 20 minutes, 2-3 times a day. Stop putting ice on if it does not help with the pain. If your skin turns bright red, take off the ice right away to prevent skin damage. The risk of damage is higher if you cannot feel pain, heat, or cold. Squeeze a soft ball or a foam pad as much as possible. This prevents swelling in the shoulder. It also helps to strengthen the arm. General instructions Take over-the-counter and prescription medicines only as told by your doctor. Keep all follow-up visits. This will help you avoid any type of permanent shoulder problems. Contact a doctor if: Your pain gets worse. Medicine does not help your pain. You have new pain in your arm, hand, or fingers. You loosen your sling and your arm, hand, or fingers: Tingle. Are numb. Are swollen. Get help right away  if: Your arm, hand, or fingers turn white or blue. This information is not intended to replace advice given to you by your health care provider. Make sure you discuss any questions you have with your health care provider. Document Revised: 12/26/2021 Document Reviewed: 12/26/2021 Elsevier Patient Education  2024 [employer]  

## 2023-04-13 NOTE — Progress Notes (Signed)
Subjective:    Patient ID: Tyler Duran, male    DOB: 1971/03/25, 52 y.o.   MRN: [id number]   Chief Complaint: medical management of chronic issues     HPI:  Tyler Duran is a 52 y.o. who identifies as a male who was assigned male at birth.   Social history: Lives with: wife Work history: owns [employer]   Comes in today for follow up of the following chronic medical issues:  1. Primary hypertension No c/o chest pain, sob or headache. Does not check blood pressure at home. BP Readings from Last 3 Encounters:  10/12/22 97/64  09/08/22 114/83  07/07/22 119/76     2. Hyperlipidemia associated with type 2 diabetes mellitus (HCC) Does not watch diet and does no exercise. Lab Results  Component Value Date   CHOL 118 10/12/2022   HDL 32 (L) 10/12/2022   LDLCALC 57 10/12/2022   TRIG 175 (H) 10/12/2022   CHOLHDL 3.7 10/12/2022     3. Acquired hypothyroidism No issues that aware of. Lab Results  Component Value Date   TSH 1.820 10/12/2022      4. Type 2 diabetes mellitus with other specified complication, without long-term current use of insulin (HCC) He doe snot check his blood sugars very often. Lab Results  Component Value Date   HGBA1C 6.5 (H) 10/12/2022     5. Idiopathic gout, unspecified chronicity, unspecified site Denies any recent gout flare ups  6. Morbid obesity (HCC) No recent weight changes Wt Readings from Last 3 Encounters:  04/13/23 257 lb (116.6 kg)  10/12/22 257 lb (116.6 kg)  09/08/22 260 lb (117.9 kg)   BMI Readings from Last 3 Encounters:  04/13/23 35.84 kg/m  10/12/22 35.84 kg/m  09/08/22 36.26 kg/m     New complaints: Left shoulder pain- has been hurting since August. He denies any injury. Painful to raise up. Rates 4/10 at its most. Resting it helps.   No Known Allergies Outpatient Encounter Medications as of 04/13/2023  Medication Sig   atorvastatin (LIPITOR) 40 MG tablet TAKE 1 TABLET(40 MG) BY MOUTH DAILY    glimepiride (AMARYL) 4 MG tablet TAKE 1 TABLET(4 MG) BY MOUTH DAILY WITH BREAKFAST   glucose blood (ONETOUCH VERIO) test strip Check blood sugar every morning fasting and prn  Dx 250.02   indomethacin (INDOCIN) 50 MG capsule Take 1 capsule (50 mg total) by mouth 3 (three) times daily with meals as needed.   levothyroxine (SYNTHROID) 150 MCG tablet Take 1 tablet (150 mcg total) by mouth daily.   lisinopril-hydrochlorothiazide (ZESTORETIC) 10-12.5 MG tablet Take 1 tablet by mouth daily.   metFORMIN (GLUCOPHAGE) 500 MG tablet TAKE 1 TABLET BY MOUTH TWICE DAILY WITH MEALS   nystatin cream (MYCOSTATIN) Apply 1 Application topically 2 (two) times daily.   Semaglutide, 1 MG/DOSE, (OZEMPIC, 1 MG/DOSE,) 4 MG/3ML SOPN INJECT 1 MG SUBCUTANEOUSLY ONCE A WEEK   No facility-administered encounter medications on file as of 04/13/2023.    Past Surgical History:  Procedure Laterality Date   APPENDECTOMY     CHOLECYSTECTOMY N/A 07/21/2013   Procedure: LAPAROSCOPIC CHOLECYSTECTOMY;  Surgeon: Dalia Heading, MD;  Location: [facility];  Service: General;  Laterality: N/A;   COLONOSCOPY Bilateral    COLONOSCOPY WITH PROPOFOL  09/08/2022    Family History  Problem Relation Age of Onset   Cancer Mother    Diabetes Father    Colon polyps Father    Colon cancer Sister    Esophageal cancer Neg Hx  Rectal cancer Neg Hx    Stomach cancer Neg Hx       Controlled substance contract: n/a     Review of Systems  Constitutional:  Negative for diaphoresis.  Eyes:  Negative for pain.  Respiratory:  Negative for shortness of breath.   Cardiovascular:  Negative for chest pain, palpitations and leg swelling.  Gastrointestinal:  Negative for abdominal pain.  Endocrine: Negative for polydipsia.  Skin:  Negative for rash.  Neurological:  Negative for dizziness, weakness and headaches.  Hematological:  Does not bruise/bleed easily.  All other systems reviewed and are negative.      Objective:   Physical  Exam Vitals and nursing note reviewed.  Constitutional:      Appearance: Normal appearance. He is well-developed.  HENT:     Head: Normocephalic.     Nose: Nose normal.     Mouth/Throat:     Mouth: Mucous membranes are moist.     Pharynx: Oropharynx is clear.  Eyes:     Pupils: Pupils are equal, round, and reactive to light.  Neck:     Thyroid: No thyroid mass or thyromegaly.     Vascular: No carotid bruit or JVD.     Trachea: Phonation normal.  Cardiovascular:     Rate and Rhythm: Normal rate and regular rhythm.  Pulmonary:     Effort: Pulmonary effort is normal. No respiratory distress.     Breath sounds: Normal breath sounds.  Abdominal:     General: Bowel sounds are normal.     Palpations: Abdomen is soft.     Tenderness: There is no abdominal tenderness.  Musculoskeletal:        General: Normal range of motion.     Cervical back: Normal range of motion and neck supple.     Comments: FROM of left shoulder with pain on full extension  Lymphadenopathy:     Cervical: No cervical adenopathy.  Skin:    General: Skin is warm and dry.  Neurological:     Mental Status: He is alert and oriented to person, place, and time.  Psychiatric:        Behavior: Behavior normal.        Thought Content: Thought content normal.        Judgment: Judgment normal.    BP 92/60   Pulse 85   Temp 98.3 F (36.8 C) (Temporal)   Resp 20   Ht 5\' 11"  (1.803 m)   Wt 257 lb (116.6 kg)   SpO2 94%   BMI 35.84 kg/m   HGBA1c 6.7%      Assessment & Plan:   [name] comes in today with chief complaint of Medical Management of Chronic Issues   Diagnosis and orders addressed:  1. Primary hypertension Low sodium diet - CBC with Differential/Platelet - CMP14+EGFR  2. Hyperlipidemia associated with type 2 diabetes mellitus (HCC) Low fat diet - Lipid panel - atorvastatin (LIPITOR) 40 MG tablet; TAKE 1 TABLET(40 MG) BY MOUTH DAILY  Dispense: 90 tablet; Refill: 1  3. Acquired  hypothyroidism Labs pending - levothyroxine (SYNTHROID) 150 MCG tablet; Take 1 tablet (150 mcg total) by mouth daily.  Dispense: 90 tablet; Refill: 1  4. Type 2 diabetes mellitus with other specified complication, without long-term current use of insulin (HCC) Continue to watch carbs in diet - Bayer DCA Hb A1c Waived - Microalbumin / creatinine urine ratio - glimepiride (AMARYL) 4 MG tablet; TAKE 1 TABLET(4 MG) BY MOUTH DAILY WITH BREAKFAST  Dispense: 90 tablet;  Refill: 1 - lisinopril-hydrochlorothiazide (ZESTORETIC) 10-12.5 MG tablet; Take 1 tablet by mouth daily.  Dispense: 90 tablet; Refill: 1 - metFORMIN (GLUCOPHAGE) 500 MG tablet; Take 1 tablet (500 mg total) by mouth 2 (two) times daily with a meal.  Dispense: 90 tablet; Refill: 0 - Semaglutide, 1 MG/DOSE, (OZEMPIC, 1 MG/DOSE,) 4 MG/3ML SOPN; Inject 1 mg into the skin once a week.  Dispense: 9 mL; Refill: 1  5. Idiopathic gout, unspecified chronicity, unspecified site Low purine diet  6. Morbid obesity (HCC) Discussed diet and exercise for person with BMI >25 Will recheck weight in 3-6 months   7. Acute pain of left shoulder Moist heat' stretches - diclofenac (VOLTAREN) 75 MG EC tablet; Take 1 tablet (75 mg total) by mouth 2 (two) times daily.  Dispense: 30 tablet; Refill: 2   Labs pending Health Maintenance reviewed Diet and exercise encouraged  Follow up plan: 6 months   [name], FNP

## 2023-04-14 LAB — CBC WITH DIFFERENTIAL/PLATELET
Basophils Absolute: 0.1 10*3/uL (ref 0.0–0.2)
Basos: 1 %
EOS (ABSOLUTE): 0.3 10*3/uL (ref 0.0–0.4)
Eos: 3 %
Hematocrit: 45.6 % (ref 37.5–51.0)
Hemoglobin: 14.5 g/dL (ref 13.0–17.7)
Immature Grans (Abs): 0 10*3/uL (ref 0.0–0.1)
Immature Granulocytes: 0 %
Lymphocytes Absolute: 2.6 10*3/uL (ref 0.7–3.1)
Lymphs: 29 %
MCH: 27.6 pg (ref 26.6–33.0)
MCHC: 31.8 g/dL (ref 31.5–35.7)
MCV: 87 fL (ref 79–97)
Monocytes Absolute: 1.1 10*3/uL — ABNORMAL HIGH (ref 0.1–0.9)
Monocytes: 12 %
Neutrophils Absolute: 5 10*3/uL (ref 1.4–7.0)
Neutrophils: 55 %
Platelets: 244 10*3/uL (ref 150–450)
RBC: 5.25 x10E6/uL (ref 4.14–5.80)
RDW: 13.2 % (ref 11.6–15.4)
WBC: 9 10*3/uL (ref 3.4–10.8)

## 2023-04-14 LAB — CMP14+EGFR
ALT: 25 IU/L (ref 0–44)
AST: 17 [IU]/L (ref 0–40)
Albumin: 4.3 g/dL (ref 3.8–4.9)
Alkaline Phosphatase: 106 IU/L (ref 44–121)
BUN/Creatinine Ratio: 15 (ref 9–20)
BUN: 18 mg/dL (ref 6–24)
Bilirubin Total: 0.6 mg/dL (ref 0.0–1.2)
CO2: 22 mmol/L (ref 20–29)
Calcium: 9.6 mg/dL (ref 8.7–10.2)
Chloride: 102 mmol/L (ref 96–106)
Creatinine, Ser: 1.22 mg/dL (ref 0.76–1.27)
Globulin, Total: 2.8 g/dL (ref 1.5–4.5)
Glucose: 118 mg/dL — ABNORMAL HIGH (ref 70–99)
Potassium: 5 mmol/L (ref 3.5–5.2)
Sodium: 140 mmol/L (ref 134–144)
Total Protein: 7.1 g/dL (ref 6.0–8.5)
eGFR: 70 mL/min/{1.73_m2} (ref >59)

## 2023-04-14 LAB — LIPID PANEL
Chol/HDL Ratio: 3.6 ratio (ref 0.0–5.0)
Cholesterol, Total: 118 mg/dL (ref 100–199)
HDL: 33 mg/dL — ABNORMAL LOW (ref >39)
LDL Chol Calc (NIH): 59 mg/dL (ref 0–99)
Triglycerides: 152 mg/dL — ABNORMAL HIGH (ref 0–149)
VLDL Cholesterol Cal: 26 mg/dL (ref 5–40)

## 2023-04-14 LAB — MICROALBUMIN / CREATININE URINE RATIO
Creatinine, Urine: 140.6 mg/dL
Microalb/Creat Ratio: 3 mg/g{creat} (ref 0–29)
Microalbumin, Urine: 4.4 ug/mL

## 2023-05-21 ENCOUNTER — Ambulatory Visit (INDEPENDENT_AMBULATORY_CARE_PROVIDER_SITE_OTHER): Payer: 59

## 2023-05-21 ENCOUNTER — Ambulatory Visit (INDEPENDENT_AMBULATORY_CARE_PROVIDER_SITE_OTHER): Payer: 59 | Admitting: Nurse Practitioner

## 2023-05-21 ENCOUNTER — Encounter: Payer: Self-pay | Admitting: Nurse Practitioner

## 2023-05-21 VITALS — BP 113/67 | HR 86 | Temp 97.9°F | Ht 71.0 in | Wt 257.0 lb

## 2023-05-21 DIAGNOSIS — E785 Hyperlipidemia, unspecified: Secondary | ICD-10-CM | POA: Diagnosis not present

## 2023-05-21 DIAGNOSIS — Z0001 Encounter for general adult medical examination with abnormal findings: Secondary | ICD-10-CM

## 2023-05-21 DIAGNOSIS — Z0000 Encounter for general adult medical examination without abnormal findings: Secondary | ICD-10-CM

## 2023-05-21 DIAGNOSIS — E1169 Type 2 diabetes mellitus with other specified complication: Secondary | ICD-10-CM

## 2023-05-21 DIAGNOSIS — I10 Essential (primary) hypertension: Secondary | ICD-10-CM | POA: Diagnosis not present

## 2023-05-21 NOTE — Patient Instructions (Signed)
How to Increase Your Level of Physical Activity Getting regular physical activity is important for your overall health and well-being. Most people do not get enough exercise. There are easy ways to increase your level of physical activity, even if you have not been very active in the past or if you are just starting out. What are the benefits of physical activity? Physical activity has many short-term and long-term benefits. Being active on a regular basis can improve your physical and mental health as well as provide other benefits. Physical health benefits Helping you lose weight or maintain a healthy weight. Strengthening your muscles and bones. Reducing your risk of certain long-term (chronic) diseases, including heart disease, cancer, and diabetes. Being able to move around more easily and for longer periods of time without getting tired (increased endurance or stamina). Improving your ability to fight off illness (enhanced immunity). Being able to sleep better. Helping you stay healthy as you get older, including: Helping you stay mobile, or capable of walking and moving around. Preventing accidents, such as falls. Increasing life expectancy. Mental health benefits Boosting your mood and improving your self-esteem. Lowering your chance of having mental health problems, such as depression or anxiety. Helping you feel good about your body. Other benefits Finding new sources of fun and enjoyment. Meeting new people who share a common interest. Before you begin If you have a chronic illness or have not been active for a while, check with your health care provider about how to get started. Ask your health care provider what activities are safe for you. Start out slowly. Walking or doing some simple chair exercises is a good place to start, especially if you have not been active before or for a long time. Set goals that you can work toward. Ask your health care provider how much exercise is  best for you. In general, most adults should: Do moderate-intensity exercise for at least 150 minutes each week (30 minutes on most days of the week) or vigorous exercise for at least 75 minutes each week, or a combination of these. Moderate-intensity exercise can include walking at a quick pace, biking, yoga, water aerobics, or gardening. Vigorous exercise involves activities that take more effort, such as jogging or running, playing sports, swimming laps, or jumping rope. Do strength exercises on at least 2 days each week. This can include weight lifting, body weight exercises, and resistance-band exercises. How to be more physically active Make a plan  Try to find activities that you enjoy. You are more likely to commit to an exercise routine if it does not feel like a chore. If you have bone or joint problems, choose low-impact exercises, like walking or swimming. Use these tips for being successful with an exercise plan: Find a workout partner for accountability. Join a group or class, such as an aerobics class, cycling class, or sports team. Make family time active. Go for a walk, bike, or swim. Include a variety of exercises each week. Consider using a fitness tracker, such as a mobile phone app or a device worn like a watch, that will count the number of steps you take each day. Many people strive to reach 10,000 steps a day. Find ways to be active in your daily routines Besides your formal exercise plans, you can find ways to do physical activity during your daily routines, such as: Walking or biking to work or to the store. Taking the stairs instead of the elevator. Parking farther away from the door at work  or at the store. Planning walking meetings. Walking around while you are on the phone. Where to find more information Centers for Disease Control and Prevention: CampusCasting.com.pt President's Council on Fitness, Sports & Nutrition: www.fitness.gov ChooseMyPlate:  http://www.harvey.com/ Contact a health care provider if: You have headaches, muscle aches, or joint pain that is concerning. You feel dizzy or light-headed while exercising. You faint. You feel your heart skipping, racing, or fluttering. You have chest pain while exercising. Summary Exercise benefits your mind and body at any age, even if you are just starting out. If you have a chronic illness or have not been active for a while, check with your health care provider before increasing your physical activity. Choose activities that are safe and enjoyable for you. Ask your health care provider what activities are safe for you. Start slowly. Tell your health care provider if you have problems as you start to increase your activity level. This information is not intended to replace advice given to you by your health care provider. Make sure you discuss any questions you have with your health care provider. Document Revised: 09/20/2020 Document Reviewed: 09/20/2020 Elsevier Patient Education  2024 [employer].

## 2023-05-21 NOTE — Progress Notes (Signed)
Subjective:    Patient ID: Tyler Duran, male    DOB: Jul 05, 1970, 52 y.o.   MRN: [id number]   Chief Complaint: Annual Exam    HPI:  Tyler Duran is a 52 y.o. who identifies as a male who was assigned male at birth.   Social history: Lives with: wife and kids Work history: owns [employer]   Comes in today for follow up of the following chronic medical issues:  1. Annual physical exam Patient was seen for chronic follow up on 04/13/23. His insurance called and told him he needed an annual physical. He is doing well with no complaints.   New complaints: None today  No Known Allergies Outpatient Encounter Medications as of 05/21/2023  Medication Sig   atorvastatin (LIPITOR) 40 MG tablet TAKE 1 TABLET(40 MG) BY MOUTH DAILY   diclofenac (VOLTAREN) 75 MG EC tablet Take 1 tablet (75 mg total) by mouth 2 (two) times daily.   glimepiride (AMARYL) 4 MG tablet TAKE 1 TABLET(4 MG) BY MOUTH DAILY WITH BREAKFAST   glucose blood (ONETOUCH VERIO) test strip Check blood sugar every morning fasting and prn  Dx 250.02   indomethacin (INDOCIN) 50 MG capsule Take 1 capsule (50 mg total) by mouth 3 (three) times daily with meals as needed.   levothyroxine (SYNTHROID) 150 MCG tablet Take 1 tablet (150 mcg total) by mouth daily.   lisinopril-hydrochlorothiazide (ZESTORETIC) 10-12.5 MG tablet Take 1 tablet by mouth daily.   metFORMIN (GLUCOPHAGE) 500 MG tablet Take 1 tablet (500 mg total) by mouth 2 (two) times daily with a meal.   nystatin cream (MYCOSTATIN) Apply 1 Application topically 2 (two) times daily.   Semaglutide, 1 MG/DOSE, (OZEMPIC, 1 MG/DOSE,) 4 MG/3ML SOPN Inject 1 mg into the skin once a week.   No facility-administered encounter medications on file as of 05/21/2023.    Past Surgical History:  Procedure Laterality Date   APPENDECTOMY     CHOLECYSTECTOMY N/A 07/21/2013   Procedure: LAPAROSCOPIC CHOLECYSTECTOMY;  Surgeon: Dalia Heading, MD;  Location: [facility];   Service: General;  Laterality: N/A;   COLONOSCOPY Bilateral    COLONOSCOPY WITH PROPOFOL  09/08/2022    Family History  Problem Relation Age of Onset   Cancer Mother    Diabetes Father    Colon polyps Father    Colon cancer Sister    Esophageal cancer Neg Hx    Rectal cancer Neg Hx    Stomach cancer Neg Hx       Controlled substance contract: n/a     Review of Systems  Constitutional:  Negative for diaphoresis.  Eyes:  Negative for pain.  Respiratory:  Negative for shortness of breath.   Cardiovascular:  Negative for chest pain, palpitations and leg swelling.  Gastrointestinal:  Negative for abdominal pain.  Endocrine: Negative for polydipsia.  Skin:  Negative for rash.  Neurological:  Negative for dizziness, weakness and headaches.  Hematological:  Does not bruise/bleed easily.  All other systems reviewed and are negative.      Objective:   Physical Exam Vitals and nursing note reviewed.  Constitutional:      Appearance: Normal appearance. He is well-developed.  HENT:     Head: Normocephalic.     Nose: Nose normal.     Mouth/Throat:     Mouth: Mucous membranes are moist.     Pharynx: Oropharynx is clear.  Eyes:     Pupils: Pupils are equal, round, and reactive to light.  Neck:  Thyroid: No thyroid mass or thyromegaly.     Vascular: No carotid bruit or JVD.     Trachea: Phonation normal.  Cardiovascular:     Rate and Rhythm: Normal rate and regular rhythm.  Pulmonary:     Effort: Pulmonary effort is normal. No respiratory distress.     Breath sounds: Normal breath sounds.  Abdominal:     General: Bowel sounds are normal.     Palpations: Abdomen is soft.     Tenderness: There is no abdominal tenderness.  Musculoskeletal:        General: Normal range of motion.     Cervical back: Normal range of motion and neck supple.  Lymphadenopathy:     Cervical: No cervical adenopathy.  Skin:    General: Skin is warm and dry.  Neurological:     Mental  Status: He is alert and oriented to person, place, and time.  Psychiatric:        Behavior: Behavior normal.        Thought Content: Thought content normal.        Judgment: Judgment normal.    BP 113/67   Pulse 86   Temp 97.9 F (36.6 C) (Temporal)   Ht 5\' 11"  (1.803 m)   Wt 257 lb (116.6 kg)   SpO2 97%   BMI 35.84 kg/m   EKG- NSR-[name], FNP  Chest xray- normal- no acute or chronic findings-Preliminary reading by Paulene Floor, FNP  [facility]        Assessment & Plan:   [name] in today with chief complaint of Annual Exam   1. Annual physical exam (Primary) Labs reviewed from 2 weeks ago - DG Chest 2 View - EKG 12-Lead    The above assessment and management plan was discussed with the patient. The patient verbalized understanding of and has agreed to the management plan. Patient is aware to call the clinic if symptoms persist or worsen. Patient is aware when to return to the clinic for a follow-up visit. Patient educated on when it is appropriate to go to the emergency department.   [name], FNP

## 2023-07-03 ENCOUNTER — Other Ambulatory Visit: Payer: Self-pay | Admitting: Nurse Practitioner

## 2023-07-03 DIAGNOSIS — E1169 Type 2 diabetes mellitus with other specified complication: Secondary | ICD-10-CM

## 2023-07-27 LAB — HM DIABETES EYE EXAM

## 2023-08-12 ENCOUNTER — Encounter: Payer: Self-pay | Admitting: Nurse Practitioner

## 2023-08-12 ENCOUNTER — Ambulatory Visit (INDEPENDENT_AMBULATORY_CARE_PROVIDER_SITE_OTHER)

## 2023-08-12 ENCOUNTER — Ambulatory Visit (INDEPENDENT_AMBULATORY_CARE_PROVIDER_SITE_OTHER): Admitting: Nurse Practitioner

## 2023-08-12 VITALS — BP 94/56 | HR 89 | Temp 97.4°F | Ht 71.0 in | Wt 260.0 lb

## 2023-08-12 DIAGNOSIS — M25512 Pain in left shoulder: Secondary | ICD-10-CM | POA: Diagnosis not present

## 2023-08-12 NOTE — Patient Instructions (Signed)
Shoulder Pain Many things can cause shoulder pain, including: An injury to the shoulder. Overuse of the shoulder. Arthritis. The source of the pain can be: Inflammation. An injury to the shoulder joint. An injury to a tendon, ligament, or bone. Follow these instructions at home: Pay attention to changes in your symptoms. Let your health care provider know about them. Follow these instructions to relieve your pain. If you have a removable sling: Wear the sling as told by your provider. Remove it only as told by your provider. Check the skin around the sling every day. Tell your provider about any concerns. Loosen the sling if your fingers tingle, become numb, or become cold. Keep the sling clean. If the sling is not waterproof: Do not let it get wet. Remove it to shower or bathe. Move your arm as little as possible, but keep your hand moving to prevent swelling. Managing pain, stiffness, and swelling  If told, put ice on the painful area. If you have a removable sling or immobilizer, remove it as told by your provider. Put ice in a plastic bag. Place a towel between your skin and the bag. Leave the ice on for 20 minutes, 2-3 times a day. If your skin turns bright red, remove the ice right away to prevent skin damage. The risk of damage is higher if you cannot feel pain, heat, or cold. Move your fingers often to reduce stiffness and swelling. Squeeze a soft ball or a foam pad as much as possible. This helps to keep the shoulder from swelling. It also helps to strengthen the arm. General instructions Take over-the-counter and prescription medicines only as told by your provider. Exercise may help with pain management. Perform exercises if told by your provider. You may be referred to a physical therapist to help in your recovery process. Keep all follow-up visits in order to avoid any type of permanent shoulder disability or chronic pain problems. Contact a health care provider  if: Your pain is not relieved with medicines. New pain develops in your arm, hand, or fingers. You loosen your sling and your arm, hand, or fingers remain tingly, numb, swollen, or painful. Get help right away if: Your arm, hand, or fingers turn white or blue. This information is not intended to replace advice given to you by your health care provider. Make sure you discuss any questions you have with your health care provider. Document Revised: 12/26/2021 Document Reviewed: 12/26/2021 Elsevier Patient Education  2024 Elsevier Inc.  

## 2023-08-12 NOTE — Progress Notes (Signed)
   Subjective:    Patient ID: Tyler Duran, male    DOB: May 01, 1971, 53 y.o.   MRN: [id number]   Chief Complaint: Shoulder Pain   Shoulder Pain  The pain is present in the left shoulder. This is a new problem. The current episode started more than 1 year ago. There has been no history of extremity trauma. The problem occurs intermittently. The pain is at a severity of 6/10. The pain is moderate. Associated symptoms include a limited range of motion. Pertinent negatives include no numbness or stiffness. The symptoms are aggravated by activity and lying down. He has tried nothing for the symptoms. The treatment provided no relief. Family history does not include gout or rheumatoid arthritis. His past medical history is significant for diabetes.    Patient Active Problem List   Diagnosis Date Noted   Gout 09/25/2019   Morbid obesity (HCC) 05/20/2015   Hyperlipidemia associated with type 2 diabetes mellitus (HCC) 03/23/2014   Diabetes mellitus, type 2 (HCC) 09/14/2013   Hypothyroidism 09/14/2013   Primary hypertension 09/14/2013       Review of Systems  Musculoskeletal:  Negative for stiffness.  Neurological:  Negative for numbness.       Objective:   Physical Exam Constitutional:      Appearance: Normal appearance.  Cardiovascular:     Rate and Rhythm: Normal rate and regular rhythm.     Heart sounds: Normal heart sounds.  Pulmonary:     Effort: Pulmonary effort is normal.     Breath sounds: Normal breath sounds.  Musculoskeletal:     Comments: Decrease ROM of left shoulder with pain on extension and full abduction Grips equal bil Motor strength and sensation distally intact.  Skin:    General: Skin is warm.  Neurological:     General: No focal deficit present.     Mental Status: He is alert and oriented to person, place, and time.  Psychiatric:        Mood and Affect: Mood normal.        Behavior: Behavior normal.    BP (!) 94/56   Pulse 89   Temp (!) 97.4 F  (36.3 C) (Temporal)   Ht 5\' 11"  (1.803 m)   Wt 260 lb (117.9 kg)   SpO2 92%   BMI 36.26 kg/m   Xray- no acute findings-Preliminary reading by Paulene Floor, FNP  [facility]       Assessment & Plan:  [name] in today with chief complaint of Shoulder Pain   1. Acute pain of left shoulder (Primary) Motrin OTC q6 as needed Moist heat or ice- which ever helps  - DG Shoulder Left - Ambulatory referral to Orthopedic Surgery    The above assessment and management plan was discussed with the patient. The patient verbalized understanding of and has agreed to the management plan. Patient is aware to call the clinic if symptoms persist or worsen. Patient is aware when to return to the clinic for a follow-up visit. Patient educated on when it is appropriate to go to the emergency department.   [name], FNP

## 2023-09-03 ENCOUNTER — Other Ambulatory Visit (HOSPITAL_COMMUNITY): Payer: Self-pay

## 2023-09-03 ENCOUNTER — Telehealth: Payer: Self-pay | Admitting: Pharmacy Technician

## 2023-09-03 NOTE — Telephone Encounter (Signed)
 Pharmacy Patient Advocate Encounter   Received notification from CoverMyMeds that prior authorization for Ozempic (0.25 or 0.5 MG/DOSE) 2MG /3ML pen-injectors is required/requested.   Insurance verification completed.   The patient is insured through [facility] .   Per test claim: The current 28 day co-pay is, $0.00.  No PA needed at this time. This test claim was processed through [facility]- copay amounts may vary at other pharmacies due to pharmacy/plan contracts, or as the patient moves through the different stages of their insurance plan.

## 2023-09-03 NOTE — Telephone Encounter (Signed)
 Left detailed message notifying pt. LS

## 2023-09-27 ENCOUNTER — Other Ambulatory Visit: Payer: Self-pay | Admitting: Nurse Practitioner

## 2023-09-27 DIAGNOSIS — E1169 Type 2 diabetes mellitus with other specified complication: Secondary | ICD-10-CM

## 2023-10-12 ENCOUNTER — Ambulatory Visit: Payer: 59 | Admitting: Nurse Practitioner

## 2023-10-12 ENCOUNTER — Encounter: Payer: Self-pay | Admitting: Nurse Practitioner

## 2023-10-12 VITALS — BP 111/65 | HR 87 | Temp 98.1°F | Ht 71.0 in | Wt 259.0 lb

## 2023-10-12 DIAGNOSIS — E1169 Type 2 diabetes mellitus with other specified complication: Secondary | ICD-10-CM | POA: Diagnosis not present

## 2023-10-12 DIAGNOSIS — I10 Essential (primary) hypertension: Secondary | ICD-10-CM | POA: Diagnosis not present

## 2023-10-12 DIAGNOSIS — E785 Hyperlipidemia, unspecified: Secondary | ICD-10-CM

## 2023-10-12 DIAGNOSIS — Z6836 Body mass index (BMI) 36.0-36.9, adult: Secondary | ICD-10-CM

## 2023-10-12 DIAGNOSIS — E039 Hypothyroidism, unspecified: Secondary | ICD-10-CM

## 2023-10-12 DIAGNOSIS — Z125 Encounter for screening for malignant neoplasm of prostate: Secondary | ICD-10-CM

## 2023-10-12 DIAGNOSIS — M1000 Idiopathic gout, unspecified site: Secondary | ICD-10-CM | POA: Diagnosis not present

## 2023-10-12 DIAGNOSIS — Z7984 Long term (current) use of oral hypoglycemic drugs: Secondary | ICD-10-CM

## 2023-10-12 LAB — BAYER DCA HB A1C WAIVED: HB A1C (BAYER DCA - WAIVED): 6.7 % — ABNORMAL HIGH (ref 4.8–5.6)

## 2023-10-12 MED ORDER — GLIMEPIRIDE 4 MG PO TABS: ORAL_TABLET | ORAL | 1 refills | Status: DC

## 2023-10-12 MED ORDER — ATORVASTATIN CALCIUM 40 MG PO TABS: ORAL_TABLET | ORAL | 1 refills | Status: DC

## 2023-10-12 MED ORDER — OZEMPIC (1 MG/DOSE) 4 MG/3ML ~~LOC~~ SOPN: 1.0000 mg | PEN_INJECTOR | SUBCUTANEOUS | 1 refills | Status: DC

## 2023-10-12 MED ORDER — LEVOTHYROXINE SODIUM 150 MCG PO TABS: 150.0000 ug | ORAL_TABLET | Freq: Every day | ORAL | 1 refills | Status: DC

## 2023-10-12 MED ORDER — LISINOPRIL-HYDROCHLOROTHIAZIDE 10-12.5 MG PO TABS: 1.0000 | ORAL_TABLET | Freq: Every day | ORAL | 1 refills | Status: DC

## 2023-10-12 MED ORDER — METFORMIN HCL 500 MG PO TABS: 500.0000 mg | ORAL_TABLET | Freq: Two times a day (BID) | ORAL | 1 refills | Status: DC

## 2023-10-12 NOTE — Progress Notes (Signed)
 Subjective:    Patient ID: Tyler Duran, male    DOB: [birth year], 53 y.o.   MRN: [id number]   Chief Complaint: medical management of chronic issues     HPI:  Tyler Duran is a 53 y.o. who identifies as a male who was assigned male at birth.   Social history: Lives with: wife Work history: owns [employer]   Comes in today for follow up of the following chronic medical issues:  1. Primary hypertension No c/o chest pain, sob or headache. Does not check blood pressure at home. BP Readings from Last 3 Encounters:  08/12/23 (!) 94/56  05/21/23 113/67  04/13/23 92/60     2. Hyperlipidemia associated with type 2 diabetes mellitus (HCC) Does not watch diet and does no exercise. Lab Results  Component Value Date   CHOL 118 04/13/2023   HDL 33 (L) 04/13/2023   LDLCALC 59 04/13/2023   TRIG 152 (H) 04/13/2023   CHOLHDL 3.6 04/13/2023     3. Acquired hypothyroidism No issues that aware of. Lab Results  Component Value Date   TSH 1.820 10/12/2022      4. Type 2 diabetes mellitus with other specified complication, without long-term current use of insulin (HCC) He does not check his blood sugars very often. Lab Results  Component Value Date   HGBA1C 6.7 (H) 04/13/2023     5. Idiopathic gout, unspecified chronicity, unspecified site Denies any recent gout flare ups  6. Morbid obesity (HCC) No recent weight changes  Wt Readings from Last 3 Encounters:  10/12/23 259 lb (117.5 kg)  08/12/23 260 lb (117.9 kg)  05/21/23 257 lb (116.6 kg)   BMI Readings from Last 3 Encounters:  10/12/23 36.12 kg/m  08/12/23 36.26 kg/m  05/21/23 35.84 kg/m      New complaints: None today  No Known Allergies Outpatient Encounter Medications as of 10/12/2023  Medication Sig   atorvastatin  (LIPITOR) 40 MG tablet TAKE 1 TABLET(40 MG) BY MOUTH DAILY   diclofenac  (VOLTAREN ) 75 MG EC tablet Take 1 tablet (75 mg total) by mouth 2 (two) times daily. (Patient not taking:  Reported on 08/12/2023)   glimepiride  (AMARYL ) 4 MG tablet TAKE 1 TABLET(4 MG) BY MOUTH DAILY WITH BREAKFAST   glucose blood (ONETOUCH VERIO) test strip Check blood sugar every morning fasting and prn  Dx 250.02   indomethacin  (INDOCIN ) 50 MG capsule Take 1 capsule (50 mg total) by mouth 3 (three) times daily with meals as needed.   levothyroxine  (SYNTHROID ) 150 MCG tablet Take 1 tablet (150 mcg total) by mouth daily.   lisinopril -hydrochlorothiazide  (ZESTORETIC ) 10-12.5 MG tablet Take 1 tablet by mouth daily.   metFORMIN  (GLUCOPHAGE ) 500 MG tablet TAKE 1 TABLET BY MOUTH TWICE DAILY WITH A MEAL   nystatin  cream (MYCOSTATIN ) Apply 1 Application topically 2 (two) times daily.   Semaglutide , 1 MG/DOSE, (OZEMPIC , 1 MG/DOSE,) 4 MG/3ML SOPN Inject 1 mg into the skin once a week.   No facility-administered encounter medications on file as of 10/12/2023.    Past Surgical History:  Procedure Laterality Date   APPENDECTOMY     CHOLECYSTECTOMY N/A 07/21/2013   Procedure: LAPAROSCOPIC CHOLECYSTECTOMY;  Surgeon: Beau Bound, MD;  Location: [facility];  Service: General;  Laterality: N/A;   COLONOSCOPY Bilateral    COLONOSCOPY WITH PROPOFOL   09/08/2022    Family History  Problem Relation Age of Onset   Cancer Mother    Diabetes Father    Colon polyps Father    Colon cancer Sister  Esophageal cancer Neg Hx    Rectal cancer Neg Hx    Stomach cancer Neg Hx       Controlled substance contract: n/a     Review of Systems  Constitutional:  Negative for diaphoresis.  Eyes:  Negative for pain.  Respiratory:  Negative for shortness of breath.   Cardiovascular:  Negative for chest pain, palpitations and leg swelling.  Gastrointestinal:  Negative for abdominal pain.  Endocrine: Negative for polydipsia.  Skin:  Negative for rash.  Neurological:  Negative for dizziness, weakness and headaches.  Hematological:  Does not bruise/bleed easily.  All other systems reviewed and are negative.       Objective:   Physical Exam Vitals and nursing note reviewed.  Constitutional:      Appearance: Normal appearance. He is well-developed.  HENT:     Head: Normocephalic.     Nose: Nose normal.     Mouth/Throat:     Mouth: Mucous membranes are moist.     Pharynx: Oropharynx is clear.  Eyes:     Pupils: Pupils are equal, round, and reactive to light.  Neck:     Thyroid : No thyroid  mass or thyromegaly.     Vascular: No carotid bruit or JVD.     Trachea: Phonation normal.  Cardiovascular:     Rate and Rhythm: Normal rate and regular rhythm.  Pulmonary:     Effort: Pulmonary effort is normal. No respiratory distress.     Breath sounds: Normal breath sounds.  Abdominal:     General: Bowel sounds are normal.     Palpations: Abdomen is soft.     Tenderness: There is no abdominal tenderness.  Musculoskeletal:        General: Normal range of motion.     Cervical back: Normal range of motion and neck supple.     Comments: FROM of left shoulder with pain on full extension  Lymphadenopathy:     Cervical: No cervical adenopathy.  Skin:    General: Skin is warm and dry.  Neurological:     Mental Status: He is alert and oriented to person, place, and time.  Psychiatric:        Behavior: Behavior normal.        Thought Content: Thought content normal.        Judgment: Judgment normal.    BP 111/65   Pulse 87   Temp 98.1 F (36.7 C) (Temporal)   Ht 5\' 11"  (1.803 m)   Wt 259 lb (117.5 kg)   SpO2 94%   BMI 36.12 kg/m    HGBA1c 6.7%      Assessment & Plan:   Tyler Duran comes in today with chief complaint of medical management of chronic issues    Diagnosis and orders addressed:  1. Primary hypertension Low sodium diet - CBC with Differential/Platelet - CMP14+EGFR  2. Hyperlipidemia associated with type 2 diabetes mellitus (HCC) Low fat diet - Lipid panel - atorvastatin  (LIPITOR) 40 MG tablet; TAKE 1 TABLET(40 MG) BY MOUTH DAILY  Dispense: 90 tablet; Refill:  1  3. Acquired hypothyroidism Labs pending - levothyroxine  (SYNTHROID ) 150 MCG tablet; Take 1 tablet (150 mcg total) by mouth daily.  Dispense: 90 tablet; Refill: 1  4. Type 2 diabetes mellitus with other specified complication, without long-term current use of insulin (HCC) Continue to watch carbs in diet - Bayer DCA Hb A1c Waived - Microalbumin / creatinine urine ratio - glimepiride  (AMARYL ) 4 MG tablet; TAKE 1 TABLET(4 MG) BY MOUTH DAILY WITH  BREAKFAST  Dispense: 90 tablet; Refill: 1 - lisinopril -hydrochlorothiazide  (ZESTORETIC ) 10-12.5 MG tablet; Take 1 tablet by mouth daily.  Dispense: 90 tablet; Refill: 1 - metFORMIN  (GLUCOPHAGE ) 500 MG tablet; Take 1 tablet (500 mg total) by mouth 2 (two) times daily with a meal.  Dispense: 90 tablet; Refill: 0 - Semaglutide , 1 MG/DOSE, (OZEMPIC , 1 MG/DOSE,) 4 MG/3ML SOPN; Inject 1 mg into the skin once a week.  Dispense: 9 mL; Refill: 1  5. Idiopathic gout, unspecified chronicity, unspecified site Low purine diet  6. Morbid obesity (HCC) Discussed diet and exercise for person with BMI >25 Will recheck weight in 3-6 months   Labs pending Health Maintenance reviewed Diet and exercise encouraged  Follow up plan: 6 months   Mary-Margaret Gaylyn Keas, FNP

## 2023-10-13 LAB — CBC WITH DIFFERENTIAL/PLATELET
Basophils Absolute: 0.1 10*3/uL (ref 0.0–0.2)
Basos: 1 %
EOS (ABSOLUTE): 0.3 10*3/uL (ref 0.0–0.4)
Eos: 3 %
Hematocrit: 43.9 % (ref 37.5–51.0)
Hemoglobin: 14.3 g/dL (ref 13.0–17.7)
Immature Grans (Abs): 0 10*3/uL (ref 0.0–0.1)
Immature Granulocytes: 1 %
Lymphocytes Absolute: 2.3 10*3/uL (ref 0.7–3.1)
Lymphs: 28 %
MCH: 28.1 pg (ref 26.6–33.0)
MCHC: 32.6 g/dL (ref 31.5–35.7)
MCV: 86 fL (ref 79–97)
Monocytes Absolute: 1.1 10*3/uL — ABNORMAL HIGH (ref 0.1–0.9)
Monocytes: 13 %
Neutrophils Absolute: 4.5 10*3/uL (ref 1.4–7.0)
Neutrophils: 54 %
Platelets: 230 10*3/uL (ref 150–450)
RBC: 5.09 x10E6/uL (ref 4.14–5.80)
RDW: 12.9 % (ref 11.6–15.4)
WBC: 8.3 10*3/uL (ref 3.4–10.8)

## 2023-10-13 LAB — CMP14+EGFR
ALT: 24 IU/L (ref 0–44)
AST: 18 IU/L (ref 0–40)
Albumin: 4.3 g/dL (ref 3.8–4.9)
Alkaline Phosphatase: 123 IU/L — ABNORMAL HIGH (ref 44–121)
BUN/Creatinine Ratio: 14 (ref 9–20)
BUN: 20 mg/dL (ref 6–24)
Bilirubin Total: 0.4 mg/dL (ref 0.0–1.2)
CO2: 23 mmol/L (ref 20–29)
Calcium: 9.5 mg/dL (ref 8.7–10.2)
Chloride: 103 mmol/L (ref 96–106)
Creatinine, Ser: 1.4 mg/dL — ABNORMAL HIGH (ref 0.76–1.27)
Globulin, Total: 2.5 g/dL (ref 1.5–4.5)
Glucose: 153 mg/dL — ABNORMAL HIGH (ref 70–99)
Potassium: 5.2 mmol/L (ref 3.5–5.2)
Sodium: 142 mmol/L (ref 134–144)
Total Protein: 6.8 g/dL (ref 6.0–8.5)
eGFR: 60 mL/min/{1.73_m2} (ref >59)

## 2023-10-13 LAB — LIPID PANEL
Chol/HDL Ratio: 4.2 ratio (ref 0.0–5.0)
Cholesterol, Total: 127 mg/dL (ref 100–199)
HDL: 30 mg/dL — ABNORMAL LOW (ref >39)
LDL Chol Calc (NIH): 66 mg/dL (ref 0–99)
Triglycerides: 185 mg/dL — ABNORMAL HIGH (ref 0–149)
VLDL Cholesterol Cal: 31 mg/dL (ref 5–40)

## 2023-10-13 LAB — THYROID PANEL WITH TSH
Free Thyroxine Index: 2.2 (ref 1.2–4.9)
T3 Uptake Ratio: 29 % (ref 24–39)
T4, Total: 7.5 ug/dL (ref 4.5–12.0)
TSH: 3.72 u[IU]/mL (ref 0.450–4.500)

## 2023-10-13 LAB — PSA, TOTAL AND FREE
PSA, Free Pct: 34 %
PSA, Free: 0.17 ng/mL
Prostate Specific Ag, Serum: 0.5 ng/mL (ref 0.0–4.0)

## 2023-10-13 LAB — VITAMIN B12: Vitamin B-12: 223 pg/mL — ABNORMAL LOW (ref 232–1245)

## 2024-01-19 ENCOUNTER — Other Ambulatory Visit: Payer: Self-pay | Admitting: Nurse Practitioner

## 2024-01-19 DIAGNOSIS — M1000 Idiopathic gout, unspecified site: Secondary | ICD-10-CM

## 2024-02-11 ENCOUNTER — Other Ambulatory Visit: Payer: Self-pay | Admitting: Nurse Practitioner

## 2024-02-11 DIAGNOSIS — E1169 Type 2 diabetes mellitus with other specified complication: Secondary | ICD-10-CM

## 2024-03-29 ENCOUNTER — Other Ambulatory Visit: Payer: Self-pay | Admitting: Nurse Practitioner

## 2024-03-29 DIAGNOSIS — E1169 Type 2 diabetes mellitus with other specified complication: Secondary | ICD-10-CM

## 2024-04-14 ENCOUNTER — Ambulatory Visit: Payer: Self-pay | Admitting: Nurse Practitioner

## 2024-04-14 ENCOUNTER — Encounter: Payer: Self-pay | Admitting: Nurse Practitioner

## 2024-04-14 VITALS — BP 125/74 | HR 71 | Temp 97.8°F | Ht 71.0 in | Wt 256.0 lb

## 2024-04-14 DIAGNOSIS — E785 Hyperlipidemia, unspecified: Secondary | ICD-10-CM

## 2024-04-14 DIAGNOSIS — E1169 Type 2 diabetes mellitus with other specified complication: Secondary | ICD-10-CM | POA: Diagnosis not present

## 2024-04-14 DIAGNOSIS — Z7984 Long term (current) use of oral hypoglycemic drugs: Secondary | ICD-10-CM

## 2024-04-14 DIAGNOSIS — E039 Hypothyroidism, unspecified: Secondary | ICD-10-CM

## 2024-04-14 DIAGNOSIS — I10 Essential (primary) hypertension: Secondary | ICD-10-CM | POA: Diagnosis not present

## 2024-04-14 DIAGNOSIS — M1000 Idiopathic gout, unspecified site: Secondary | ICD-10-CM | POA: Diagnosis not present

## 2024-04-14 LAB — BAYER DCA HB A1C WAIVED: HB A1C (BAYER DCA - WAIVED): 6.7 % — ABNORMAL HIGH (ref 4.8–5.6)

## 2024-04-14 LAB — LIPID PANEL

## 2024-04-14 MED ORDER — GLIMEPIRIDE 4 MG PO TABS: ORAL_TABLET | ORAL | 1 refills | Status: AC

## 2024-04-14 MED ORDER — LEVOTHYROXINE SODIUM 150 MCG PO TABS: 150.0000 ug | ORAL_TABLET | Freq: Every day | ORAL | 1 refills | Status: AC

## 2024-04-14 MED ORDER — OZEMPIC (1 MG/DOSE) 4 MG/3ML ~~LOC~~ SOPN: 1.0000 mg | PEN_INJECTOR | SUBCUTANEOUS | 1 refills | Status: AC

## 2024-04-14 MED ORDER — METFORMIN HCL 500 MG PO TABS: 500.0000 mg | ORAL_TABLET | Freq: Two times a day (BID) | ORAL | 1 refills | Status: AC

## 2024-04-14 MED ORDER — ATORVASTATIN CALCIUM 40 MG PO TABS: ORAL_TABLET | ORAL | 1 refills | Status: AC

## 2024-04-14 MED ORDER — LISINOPRIL-HYDROCHLOROTHIAZIDE 10-12.5 MG PO TABS: 1.0000 | ORAL_TABLET | Freq: Every day | ORAL | 1 refills | Status: AC

## 2024-04-14 NOTE — Progress Notes (Signed)
 Subjective:    Patient ID: Tyler Duran, male    DOB: 03-12-1971, 53 y.o.   MRN: [id number]   Chief Complaint: medical management of chronic issues     HPI:  Tyler Duran is a 53 y.o. who identifies as a male who was assigned male at birth.   Social history: Lives with: wife Work history: owns [employer]   Comes in today for follow up of the following chronic medical issues:  1. Primary hypertension No c/o chest pain, sob or headache. Does not check blood pressure at home. BP Readings from Last 3 Encounters:  10/12/23 111/65  08/12/23 (!) 94/56  05/21/23 113/67     2. Hyperlipidemia associated with type 2 diabetes mellitus (HCC) Does not watch diet and does no exercise. Lab Results  Component Value Date   CHOL 127 10/12/2023   HDL 30 (L) 10/12/2023   LDLCALC 66 10/12/2023   TRIG 185 (H) 10/12/2023   CHOLHDL 4.2 10/12/2023  The ASCVD Risk score (Arnett DK, et al., 2019) failed to calculate for the following reasons:   The valid total cholesterol range is 130 to 320 mg/dL    3. Acquired hypothyroidism No issues that aware of. Lab Results  Component Value Date   TSH 3.720 10/12/2023      4. Type 2 diabetes mellitus with other specified complication, without long-term current use of insulin (HCC) He does not check his blood sugars very often. Lab Results  Component Value Date   HGBA1C 6.7 (H) 10/12/2023     5. Idiopathic gout, unspecified chronicity, unspecified site Denies any recent gout flare ups  6. Morbid obesity (HCC) No recent weight changes  Wt Readings from Last 3 Encounters:  04/14/24 256 lb (116.1 kg)  10/12/23 259 lb (117.5 kg)  08/12/23 260 lb (117.9 kg)   BMI Readings from Last 3 Encounters:  04/14/24 35.70 kg/m  10/12/23 36.12 kg/m  08/12/23 36.26 kg/m        New complaints: None today  No Known Allergies Outpatient Encounter Medications as of 04/14/2024  Medication Sig   atorvastatin  (LIPITOR) 40 MG tablet  TAKE 1 TABLET(40 MG) BY MOUTH DAILY   diclofenac  (VOLTAREN ) 75 MG EC tablet Take 1 tablet (75 mg total) by mouth 2 (two) times daily. (Patient not taking: Reported on 10/12/2023)   glimepiride  (AMARYL ) 4 MG tablet TAKE 1 TABLET(4 MG) BY MOUTH DAILY WITH BREAKFAST   glucose blood (ONETOUCH VERIO) test strip Check blood sugar every morning fasting and prn  Dx 250.02   indomethacin  (INDOCIN ) 50 MG capsule TAKE 1 CAPSULE BY MOUTH THREE TIMES DAILY WITH MEALS AS NEEDED   levothyroxine  (SYNTHROID ) 150 MCG tablet Take 1 tablet (150 mcg total) by mouth daily.   lisinopril -hydrochlorothiazide  (ZESTORETIC ) 10-12.5 MG tablet Take 1 tablet by mouth daily.   metFORMIN  (GLUCOPHAGE ) 500 MG tablet TAKE 1 TABLET BY MOUTH TWICE DAILY WITH A MEAL   Semaglutide , 1 MG/DOSE, (OZEMPIC , 1 MG/DOSE,) 4 MG/3ML SOPN Inject 1 mg into the skin once a week.   No facility-administered encounter medications on file as of 04/14/2024.    Past Surgical History:  Procedure Laterality Date   APPENDECTOMY     CHOLECYSTECTOMY N/A 07/21/2013   Procedure: LAPAROSCOPIC CHOLECYSTECTOMY;  Surgeon: Oneil DELENA Budge, MD;  Location: [facility];  Service: General;  Laterality: N/A;   COLONOSCOPY Bilateral    COLONOSCOPY WITH PROPOFOL   09/08/2022    Family History  Problem Relation Age of Onset   Cancer Mother    Diabetes Father  Colon polyps Father    Colon cancer Sister    Esophageal cancer Neg Hx    Rectal cancer Neg Hx    Stomach cancer Neg Hx       Controlled substance contract: n/a     Review of Systems  Constitutional:  Negative for diaphoresis.  Eyes:  Negative for pain.  Respiratory:  Negative for shortness of breath.   Cardiovascular:  Negative for chest pain, palpitations and leg swelling.  Gastrointestinal:  Negative for abdominal pain.  Endocrine: Negative for polydipsia.  Skin:  Negative for rash.  Neurological:  Negative for dizziness, weakness and headaches.  Hematological:  Does not bruise/bleed easily.   All other systems reviewed and are negative.      Objective:   Physical Exam Vitals and nursing note reviewed.  Constitutional:      Appearance: Normal appearance. He is well-developed.  HENT:     Head: Normocephalic.     Nose: Nose normal.     Mouth/Throat:     Mouth: Mucous membranes are moist.     Pharynx: Oropharynx is clear.  Eyes:     Pupils: Pupils are equal, round, and reactive to light.  Neck:     Thyroid : No thyroid  mass or thyromegaly.     Vascular: No carotid bruit or JVD.     Trachea: Phonation normal.  Cardiovascular:     Rate and Rhythm: Normal rate and regular rhythm.  Pulmonary:     Effort: Pulmonary effort is normal. No respiratory distress.     Breath sounds: Normal breath sounds.  Abdominal:     General: Bowel sounds are normal.     Palpations: Abdomen is soft.     Tenderness: There is no abdominal tenderness.  Musculoskeletal:        General: Normal range of motion.     Cervical back: Normal range of motion and neck supple.     Comments: FROM of left shoulder with pain on full extension  Lymphadenopathy:     Cervical: No cervical adenopathy.  Skin:    General: Skin is warm and dry.  Neurological:     Mental Status: He is alert and oriented to person, place, and time.  Psychiatric:        Behavior: Behavior normal.        Thought Content: Thought content normal.        Judgment: Judgment normal.    BP 125/74   Pulse 71   Temp 97.8 F (36.6 C) (Temporal)   Ht 5' 11 (1.803 m)   Wt 256 lb (116.1 kg)   SpO2 96%   BMI 35.70 kg/m     HGBA1c 6.4%      Assessment & Plan:   Dominque Marlin comes in today with chief complaint of medical management of chronic issues    Diagnosis and orders addressed:  1. Primary hypertension Low sodium diet - CBC with Differential/Platelet - CMP14+EGFR  2. Hyperlipidemia associated with type 2 diabetes mellitus (HCC) Low fat diet - Lipid panel - atorvastatin  (LIPITOR) 40 MG tablet; TAKE 1  TABLET(40 MG) BY MOUTH DAILY  Dispense: 90 tablet; Refill: 1  3. Acquired hypothyroidism Labs pending - levothyroxine  (SYNTHROID ) 150 MCG tablet; Take 1 tablet (150 mcg total) by mouth daily.  Dispense: 90 tablet; Refill: 1  4. Type 2 diabetes mellitus with other specified complication, without long-term current use of insulin (HCC) Continue to watch carbs in diet - Bayer DCA Hb A1c Waived - Microalbumin / creatinine urine ratio -  glimepiride  (AMARYL ) 4 MG tablet; TAKE 1 TABLET(4 MG) BY MOUTH DAILY WITH BREAKFAST  Dispense: 90 tablet; Refill: 1 - lisinopril -hydrochlorothiazide  (ZESTORETIC ) 10-12.5 MG tablet; Take 1 tablet by mouth daily.  Dispense: 90 tablet; Refill: 1 - metFORMIN  (GLUCOPHAGE ) 500 MG tablet; Take 1 tablet (500 mg total) by mouth 2 (two) times daily with a meal.  Dispense: 90 tablet; Refill: 0 - Semaglutide , 1 MG/DOSE, (OZEMPIC , 1 MG/DOSE,) 4 MG/3ML SOPN; Inject 1 mg into the skin once a week.  Dispense: 9 mL; Refill: 1  5. Idiopathic gout, unspecified chronicity, unspecified site Low purine diet  6. Morbid obesity (HCC) Discussed diet and exercise for person with BMI >25 Will recheck weight in 3-6 months   Labs pending Health Maintenance reviewed Diet and exercise encouraged  Follow up plan: 6 months   Mary-Margaret Gladis, FNP

## 2024-04-14 NOTE — Patient Instructions (Signed)

## 2024-04-15 LAB — CMP14+EGFR
ALT: 31 IU/L (ref 0–44)
AST: 25 IU/L (ref 0–40)
Albumin: 4.4 g/dL (ref 3.8–4.9)
Alkaline Phosphatase: 107 IU/L (ref 47–123)
BUN/Creatinine Ratio: 13 (ref 9–20)
BUN: 17 mg/dL (ref 6–24)
Bilirubin Total: 0.5 mg/dL (ref 0.0–1.2)
CO2: 26 mmol/L (ref 20–29)
Calcium: 9.6 mg/dL (ref 8.7–10.2)
Chloride: 103 mmol/L (ref 96–106)
Creatinine, Ser: 1.26 mg/dL (ref 0.76–1.27)
Globulin, Total: 2.9 g/dL (ref 1.5–4.5)
Glucose: 106 mg/dL — ABNORMAL HIGH (ref 70–99)
Potassium: 4.8 mmol/L (ref 3.5–5.2)
Sodium: 141 mmol/L (ref 134–144)
Total Protein: 7.3 g/dL (ref 6.0–8.5)
eGFR: 67 mL/min/1.73 (ref >59)

## 2024-04-15 LAB — CBC WITH DIFFERENTIAL/PLATELET
Basophils Absolute: 0.1 x10E3/uL (ref 0.0–0.2)
Basos: 1 %
EOS (ABSOLUTE): 0.3 x10E3/uL (ref 0.0–0.4)
Eos: 3 %
Hematocrit: 46.7 % (ref 37.5–51.0)
Hemoglobin: 14.8 g/dL (ref 13.0–17.7)
Immature Grans (Abs): 0 x10E3/uL (ref 0.0–0.1)
Immature Granulocytes: 0 %
Lymphocytes Absolute: 3.4 x10E3/uL — ABNORMAL HIGH (ref 0.7–3.1)
Lymphs: 34 %
MCH: 27.2 pg (ref 26.6–33.0)
MCHC: 31.7 g/dL (ref 31.5–35.7)
MCV: 86 fL (ref 79–97)
Monocytes Absolute: 1.1 x10E3/uL — ABNORMAL HIGH (ref 0.1–0.9)
Monocytes: 11 %
Neutrophils Absolute: 5.2 x10E3/uL (ref 1.4–7.0)
Neutrophils: 51 %
Platelets: 270 x10E3/uL (ref 150–450)
RBC: 5.45 x10E6/uL (ref 4.14–5.80)
RDW: 13.5 % (ref 11.6–15.4)
WBC: 10.2 x10E3/uL (ref 3.4–10.8)

## 2024-04-15 LAB — LIPID PANEL
Chol/HDL Ratio: 3.5 ratio (ref 0.0–5.0)
Cholesterol, Total: 124 mg/dL (ref 100–199)
HDL: 35 mg/dL — ABNORMAL LOW (ref >39)
LDL Chol Calc (NIH): 65 mg/dL (ref 0–99)
Triglycerides: 134 mg/dL (ref 0–149)
VLDL Cholesterol Cal: 24 mg/dL (ref 5–40)

## 2024-04-16 LAB — MICROALBUMIN / CREATININE URINE RATIO
Creatinine, Urine: 130.4 mg/dL
Microalb/Creat Ratio: 3 mg/g{creat} (ref 0–29)
Microalbumin, Urine: 4 ug/mL

## 2024-04-17 ENCOUNTER — Ambulatory Visit: Payer: Self-pay | Admitting: Nurse Practitioner

## 2024-06-19 ENCOUNTER — Ambulatory Visit: Payer: Self-pay

## 2024-06-19 NOTE — Telephone Encounter (Signed)
 Appt made

## 2024-06-19 NOTE — Telephone Encounter (Signed)
 FYI Only or Action Required?: FYI only for provider: appointment scheduled on 06/20/2024.  Patient was last seen in primary care on 04/14/2024 by Gladis Mustard, FNP.  Called Nurse Triage reporting Dizziness.  Symptoms began several days ago.  Interventions attempted: Rest, hydration, or home remedies.  Symptoms are: gradually improving.  Triage Disposition: See Physician Within 24 Hours  Patient/caregiver understands and will follow disposition?: Yes  Copied from CRM #[id number]. Topic: Clinical - Red Word Triage >> Jun 19, 2024 12:30 PM Selinda RAMAN wrote: Red Word that prompted transfer to Nurse Triage: The patient called in stating since Thursday he has had bouts of dizziness that he described as foggy ness or lightheadedness. He said he had blurred vision as well on Thursday that was really bad. I will transfer him to E2C2 NT Reason for Disposition  [1] MODERATE dizziness (e.g., interferes with normal activities) AND [2] has NOT been evaluated by doctor (or NP/PA) for this  (Exception: Dizziness caused by heat exposure, sudden standing, or poor fluid intake.)  Answer Assessment - Initial Assessment Questions 1. DESCRIPTION: Describe your dizziness.     Dizziness/fogginess  2. LIGHTHEADED: Do you feel lightheaded? (e.g., somewhat faint, woozy, weak upon standing)     Foggy today 3. VERTIGO: Do you feel like either you or the room is spinning or tilting? (i.e., vertigo)     denies 4. SEVERITY: How bad is it?  Do you feel like you are going to faint? Can you stand and walk?     Able to stand and walk 5. ONSET:  When did the dizziness begin?     Thursday, 4 days ago 6. AGGRAVATING FACTORS: Does anything make it worse? (e.g., standing, change in head position)     denies  8. CAUSE: What do you think is causing the dizziness? (e.g., decreased fluids or food, diarrhea, emotional distress, heat exposure, new medicine, sudden standing, vomiting; unknown)     Unknown,  blood sugars are stable 9. RECURRENT SYMPTOM: Have you had dizziness before? If Yes, ask: When was the last time? What happened that time?     denies 10. OTHER SYMPTOMS: Do you have any other symptoms? (e.g., fever, chest pain, vomiting, diarrhea, bleeding)       denies  Protocols used: Dizziness - Lightheadedness-A-AH

## 2024-06-20 ENCOUNTER — Encounter: Payer: Self-pay | Admitting: Nurse Practitioner

## 2024-06-20 ENCOUNTER — Ambulatory Visit: Admitting: Nurse Practitioner

## 2024-06-20 VITALS — BP 106/70 | HR 88 | Temp 97.9°F | Ht 71.0 in | Wt 257.0 lb

## 2024-06-20 DIAGNOSIS — R42 Dizziness and giddiness: Secondary | ICD-10-CM

## 2024-06-20 MED ORDER — PREDNISONE 20 MG PO TABS: 40.0000 mg | ORAL_TABLET | Freq: Every day | ORAL | 0 refills | Status: AC

## 2024-06-20 NOTE — Patient Instructions (Signed)
 Vertigo Vertigo is the feeling that you or the things around you are moving or spinning when they're not. It's different than feeling dizzy. It can also cause: Loss of balance. Trouble standing or walking. Nausea and vomiting. This feeling can come and go at any time. It can last from a few seconds to minutes or even hours. It may go away on its own or be treated with medicine. What are the types of vertigo? There are two types of vertigo: Peripheral vertigo happens when parts of your inner ear don't work like they should. This is the more common type. Central vertigo happens when your brain and spinal cord don't work like they should. Your health care provider will do tests to find out what kind of vertigo you have. This will help them decide on the right treatment for you. Follow these instructions at home: Eating and drinking Drink enough fluid to keep your pee (urine) pale yellow. Do not drink alcohol. Activity When you get up in the morning, first sit up on the side of the bed. When you feel okay, stand slowly while holding onto something. Move slowly. Avoid sudden body or head movements. Avoid certain positions, as told by your provider. Use a cane if you have trouble standing or walking. Sit down right away if you feel unsteady. Place items in your home so they're easy for you to reach without bending or leaning over. Return to normal activities when you're told. Ask what things are safe for you to do. General instructions Take your medicines only as told by your provider. Contact a health care provider if: Your medicines don't help or make your vertigo worse. You get new symptoms. You have a fever. You have nausea or vomiting. Your family or friends spot any changes in how you're acting. A part of your body goes numb. You feel tingling and prickling in a part of your body. You get very bad headaches. Get help right away if: You're always dizzy or you faint. You have a  stiff neck. You have trouble moving or speaking. Your hands, arms, or legs feel weak. Your hearing or eyesight changes. These symptoms may be an emergency. Call 911 right away. Do not wait to see if the symptoms will go away. Do not drive yourself to the hospital. This information is not intended to replace advice given to you by your health care provider. Make sure you discuss any questions you have with your health care provider. Document Revised: 02/25/2023 Document Reviewed: 08/28/2022 Elsevier Patient Education  2024 [employer].

## 2024-06-20 NOTE — Progress Notes (Signed)
" ° °  Subjective:    Patient ID: Tyler Duran, male    DOB: 1970/08/07, 54 y.o.   MRN: [id number]   Chief Complaint: Dizziness (Head feeling foggy for 5 days)   Dizziness This is a new problem. The current episode started in the past 7 days. The problem occurs intermittently. The problem has been waxing and waning. Pertinent negatives include no chills, congestion, fever, nausea or vomiting. Nothing aggravates the symptoms. He has tried nothing for the symptoms. The treatment provided no relief.    Patient Active Problem List   Diagnosis Date Noted   Gout 09/25/2019   Morbid obesity (HCC) 05/20/2015   Hyperlipidemia associated with type 2 diabetes mellitus (HCC) 03/23/2014   Diabetes mellitus, type 2 (HCC) 09/14/2013   Hypothyroidism 09/14/2013   Primary hypertension 09/14/2013       Review of Systems  Constitutional:  Negative for chills and fever.  HENT:  Negative for congestion.   Eyes:  Negative for visual disturbance.  Gastrointestinal:  Negative for nausea and vomiting.  Neurological:  Positive for dizziness.       Objective:   Physical Exam Constitutional:      Appearance: Normal appearance.  HENT:     Right Ear: Tympanic membrane normal.     Left Ear: Tympanic membrane normal.     Nose: Congestion present. No rhinorrhea.     Mouth/Throat:     Pharynx: No oropharyngeal exudate or posterior oropharyngeal erythema.  Cardiovascular:     Rate and Rhythm: Normal rate and regular rhythm.     Heart sounds: Normal heart sounds.  Pulmonary:     Effort: Pulmonary effort is normal.     Breath sounds: Normal breath sounds.  Skin:    General: Skin is warm.  Neurological:     General: No focal deficit present.     Mental Status: He is alert and oriented to person, place, and time.  Psychiatric:        Mood and Affect: Mood normal.        Behavior: Behavior normal.    BP 106/70   Pulse 88   Temp 97.9 F (36.6 C) (Temporal)   Ht 5' 11 (1.803 m)   Wt 257 lb (116.6  kg)   SpO2 93%   BMI 35.84 kg/m         Assessment & Plan:  Tyler Duran in today with chief complaint of Dizziness (Head feeling foggy for 5 days)   1. Vertigo (Primary) Force fluids Rest RTO prn - predniSONE  (DELTASONE ) 20 MG tablet; Take 2 tablets (40 mg total) by mouth daily with breakfast for 5 days. 2 po daily for 5 days  Dispense: 10 tablet; Refill: 0    The above assessment and management plan was discussed with the patient. The patient verbalized understanding of and has agreed to the management plan. Patient is aware to call the clinic if symptoms persist or worsen. Patient is aware when to return to the clinic for a follow-up visit. Patient educated on when it is appropriate to go to the emergency department.   Mary-Margaret Gladis, FNP    "

## 2024-10-10 ENCOUNTER — Ambulatory Visit: Admitting: Nurse Practitioner
